# Patient Record
Sex: Female | Born: 1990 | Race: Black or African American | Hispanic: No | Marital: Single | Smoking: Current every day smoker
Health system: Southern US, Community
[De-identification: ages and names within clinical notes are randomized; demographics above are authoritative.]

## PROBLEM LIST (undated history)

## (undated) ENCOUNTER — Inpatient Hospital Stay: Payer: Self-pay

## (undated) ENCOUNTER — Ambulatory Visit: Admission: EM | Payer: Medicaid Other

## (undated) ENCOUNTER — Inpatient Hospital Stay (HOSPITAL_COMMUNITY): Payer: Self-pay

## (undated) DIAGNOSIS — Z87442 Personal history of urinary calculi: Secondary | ICD-10-CM

## (undated) DIAGNOSIS — R519 Headache, unspecified: Secondary | ICD-10-CM

## (undated) DIAGNOSIS — O139 Gestational [pregnancy-induced] hypertension without significant proteinuria, unspecified trimester: Secondary | ICD-10-CM

## (undated) DIAGNOSIS — O24419 Gestational diabetes mellitus in pregnancy, unspecified control: Secondary | ICD-10-CM

## (undated) DIAGNOSIS — N200 Calculus of kidney: Secondary | ICD-10-CM

## (undated) DIAGNOSIS — D649 Anemia, unspecified: Secondary | ICD-10-CM

## (undated) HISTORY — PX: WISDOM TOOTH EXTRACTION: SHX21

## (undated) HISTORY — PX: CHOLECYSTECTOMY: SHX55

## (undated) HISTORY — DX: Calculus of kidney: N20.0

---

## 2011-08-11 ENCOUNTER — Emergency Department (HOSPITAL_COMMUNITY)
Admission: EM | Admit: 2011-08-11 | Discharge: 2011-08-12 | Disposition: A | Discharge status: Home / Self Care | Payer: Self-pay | Attending: Emergency Medicine | Admitting: Emergency Medicine

## 2011-08-11 DIAGNOSIS — J3489 Other specified disorders of nose and nasal sinuses: Secondary | ICD-10-CM | POA: Insufficient documentation

## 2011-08-11 DIAGNOSIS — R05 Cough: Secondary | ICD-10-CM | POA: Insufficient documentation

## 2011-08-11 DIAGNOSIS — R059 Cough, unspecified: Secondary | ICD-10-CM | POA: Insufficient documentation

## 2011-08-11 DIAGNOSIS — J029 Acute pharyngitis, unspecified: Secondary | ICD-10-CM | POA: Insufficient documentation

## 2011-08-11 DIAGNOSIS — IMO0001 Reserved for inherently not codable concepts without codable children: Secondary | ICD-10-CM | POA: Insufficient documentation

## 2011-08-11 DIAGNOSIS — R6883 Chills (without fever): Secondary | ICD-10-CM | POA: Insufficient documentation

## 2011-08-11 DIAGNOSIS — J069 Acute upper respiratory infection, unspecified: Secondary | ICD-10-CM | POA: Insufficient documentation

## 2011-08-11 LAB — URINALYSIS, ROUTINE W REFLEX MICROSCOPIC
Bilirubin Urine: NEGATIVE
Hgb urine dipstick: NEGATIVE
Specific Gravity, Urine: 1.022 (ref 1.005–1.030)
pH: 7 (ref 5.0–8.0)

## 2011-08-11 LAB — URINE MICROSCOPIC-ADD ON

## 2011-08-12 ENCOUNTER — Emergency Department (HOSPITAL_COMMUNITY): Payer: Self-pay

## 2013-01-13 ENCOUNTER — Ambulatory Visit: Payer: Self-pay | Admitting: Family Medicine

## 2013-01-13 ENCOUNTER — Emergency Department: Payer: Self-pay | Admitting: Emergency Medicine

## 2013-01-13 LAB — URINALYSIS, COMPLETE
Bacteria: NONE SEEN
Ketone: NEGATIVE
Leukocyte Esterase: NEGATIVE
Ph: 6 (ref 4.5–8.0)
RBC,UR: <1 /HPF (ref 0–5)
Squamous Epithelial: 1
WBC UR: 1 /HPF (ref 0–5)

## 2013-01-13 LAB — PREGNANCY, URINE: Pregnancy Test, Urine: NEGATIVE m[IU]/mL

## 2013-04-09 ENCOUNTER — Emergency Department: Payer: Self-pay | Admitting: Emergency Medicine

## 2013-04-09 LAB — URINALYSIS, COMPLETE
Bacteria: NONE SEEN
Bilirubin,UR: NEGATIVE
Glucose,UR: NEGATIVE mg/dL (ref 0–75)
Ketone: NEGATIVE
Leukocyte Esterase: NEGATIVE
Nitrite: NEGATIVE
Ph: 8 (ref 4.5–8.0)
RBC,UR: 1 /HPF (ref 0–5)
Specific Gravity: 1.019 (ref 1.003–1.030)
Squamous Epithelial: <1
WBC UR: NONE SEEN /HPF (ref 0–5)

## 2013-04-09 LAB — CBC WITH DIFFERENTIAL/PLATELET
Basophil %: 0.7 %
Eosinophil #: 0.1 10*3/uL (ref 0.0–0.7)
Eosinophil %: 0.8 %
Monocyte #: 0.9 x10 3/mm (ref 0.2–0.9)
Neutrophil #: 12.6 10*3/uL — ABNORMAL HIGH (ref 1.4–6.5)
RBC: 4.74 10*6/uL (ref 3.80–5.20)
WBC: 16.6 10*3/uL — ABNORMAL HIGH (ref 3.6–11.0)

## 2013-04-09 LAB — COMPREHENSIVE METABOLIC PANEL
Albumin: 3.8 g/dL (ref 3.4–5.0)
Alkaline Phosphatase: 71 U/L (ref 50–136)
BUN: 16 mg/dL (ref 7–18)
EGFR (Non-African Amer.): >60
Osmolality: 281 (ref 275–301)
Total Protein: 7.7 g/dL (ref 6.4–8.2)

## 2013-04-09 LAB — HCG, QUANTITATIVE, PREGNANCY: Beta Hcg, Quant.: 30 m[IU]/mL — ABNORMAL HIGH

## 2013-06-29 ENCOUNTER — Emergency Department: Payer: Self-pay | Admitting: Emergency Medicine

## 2013-06-29 LAB — CBC
HGB: 11.4 g/dL — ABNORMAL LOW (ref 12.0–16.0)
MCHC: 36.2 g/dL — ABNORMAL HIGH (ref 32.0–36.0)
MCV: 81 fL (ref 80–100)
Platelet: 232 10*3/uL (ref 150–440)
RBC: 3.91 10*6/uL (ref 3.80–5.20)
WBC: 12.7 10*3/uL — ABNORMAL HIGH (ref 3.6–11.0)

## 2013-06-29 LAB — BASIC METABOLIC PANEL
Anion Gap: 4 — ABNORMAL LOW (ref 7–16)
Calcium, Total: 8.7 mg/dL (ref 8.5–10.1)
Chloride: 109 mmol/L — ABNORMAL HIGH (ref 98–107)
Co2: 26 mmol/L (ref 21–32)
Creatinine: 0.58 mg/dL — ABNORMAL LOW (ref 0.60–1.30)
EGFR (African American): >60
EGFR (Non-African Amer.): >60
Osmolality: 277 (ref 275–301)

## 2013-06-29 LAB — URINALYSIS, COMPLETE
Bacteria: NONE SEEN
Blood: NEGATIVE
Ketone: NEGATIVE
Ph: 6 (ref 4.5–8.0)
Protein: NEGATIVE
RBC,UR: 1 /HPF (ref 0–5)
WBC UR: 3 /HPF (ref 0–5)

## 2013-06-29 LAB — WET PREP, GENITAL

## 2013-07-29 ENCOUNTER — Emergency Department: Payer: Self-pay | Admitting: Emergency Medicine

## 2013-07-29 ENCOUNTER — Ambulatory Visit: Payer: Self-pay | Admitting: Family Medicine

## 2013-07-29 LAB — COMPREHENSIVE METABOLIC PANEL
Alkaline Phosphatase: 66 U/L (ref 50–136)
Anion Gap: 7 (ref 7–16)
BUN: 10 mg/dL (ref 7–18)
Bilirubin,Total: 0.3 mg/dL (ref 0.2–1.0)
Calcium, Total: 9 mg/dL (ref 8.5–10.1)
Co2: 25 mmol/L (ref 21–32)
EGFR (Non-African Amer.): >60
Glucose: 110 mg/dL — ABNORMAL HIGH (ref 65–99)
Osmolality: 274 (ref 275–301)
Potassium: 3.6 mmol/L (ref 3.5–5.1)
SGOT(AST): 19 U/L (ref 15–37)
SGPT (ALT): 22 U/L (ref 12–78)

## 2013-07-29 LAB — URINALYSIS, COMPLETE
Bilirubin,UR: NEGATIVE
Blood: NEGATIVE
Glucose,UR: NEGATIVE mg/dL (ref 0–75)
Nitrite: NEGATIVE
Ph: 6 (ref 4.5–8.0)
Squamous Epithelial: 25
WBC UR: 27 /HPF (ref 0–5)

## 2013-07-29 LAB — HCG, QUANTITATIVE, PREGNANCY: Beta Hcg, Quant.: 41114 m[IU]/mL — ABNORMAL HIGH

## 2013-07-29 LAB — CBC
MCH: 29.8 pg (ref 26.0–34.0)
MCHC: 36.3 g/dL — ABNORMAL HIGH (ref 32.0–36.0)
MCV: 82 fL (ref 80–100)
Platelet: 226 10*3/uL (ref 150–440)
RBC: 3.79 10*6/uL — ABNORMAL LOW (ref 3.80–5.20)
RDW: 13.6 % (ref 11.5–14.5)
WBC: 12.5 10*3/uL — ABNORMAL HIGH (ref 3.6–11.0)

## 2013-08-17 ENCOUNTER — Observation Stay: Payer: Self-pay | Admitting: Obstetrics and Gynecology

## 2013-08-17 LAB — URINALYSIS, COMPLETE
Bacteria: NONE SEEN
Blood: NEGATIVE
Ketone: NEGATIVE
Leukocyte Esterase: NEGATIVE
Specific Gravity: 1.019 (ref 1.003–1.030)
Squamous Epithelial: NONE SEEN

## 2013-12-06 ENCOUNTER — Observation Stay: Payer: Self-pay

## 2013-12-07 ENCOUNTER — Inpatient Hospital Stay: Payer: Self-pay | Admitting: Obstetrics and Gynecology

## 2013-12-07 LAB — CBC WITH DIFFERENTIAL/PLATELET
BASOS ABS: 0.1 10*3/uL (ref 0.0–0.1)
BASOS PCT: 0.4 %
EOS ABS: 0 10*3/uL (ref 0.0–0.7)
Eosinophil %: 0.2 %
HCT: 35 % (ref 35.0–47.0)
HGB: 12.1 g/dL (ref 12.0–16.0)
Lymphocyte #: 2.4 10*3/uL (ref 1.0–3.6)
Lymphocyte %: 12 %
MCH: 27.3 pg (ref 26.0–34.0)
MCHC: 34.6 g/dL (ref 32.0–36.0)
MCV: 79 fL — ABNORMAL LOW (ref 80–100)
MONO ABS: 0.9 x10 3/mm (ref 0.2–0.9)
MONOS PCT: 4.4 %
NEUTROS PCT: 83 %
Neutrophil #: 16.4 10*3/uL — ABNORMAL HIGH (ref 1.4–6.5)
PLATELETS: 240 10*3/uL (ref 150–440)
RBC: 4.43 10*6/uL (ref 3.80–5.20)
RDW: 15.2 % — AB (ref 11.5–14.5)
WBC: 19.8 10*3/uL — AB (ref 3.6–11.0)

## 2013-12-07 LAB — GC/CHLAMYDIA PROBE AMP

## 2013-12-08 LAB — HEMATOCRIT: HCT: 27.7 % — AB (ref 35.0–47.0)

## 2014-03-09 ENCOUNTER — Emergency Department: Payer: Self-pay | Admitting: Internal Medicine

## 2014-03-11 ENCOUNTER — Emergency Department: Payer: Self-pay | Admitting: Emergency Medicine

## 2014-04-08 ENCOUNTER — Emergency Department: Payer: Self-pay | Admitting: Emergency Medicine

## 2014-04-08 LAB — CBC WITH DIFFERENTIAL/PLATELET
BASOS ABS: 0.1 10*3/uL (ref 0.0–0.1)
BASOS PCT: 0.5 %
EOS PCT: 1.3 %
Eosinophil #: 0.2 10*3/uL (ref 0.0–0.7)
HCT: 36.6 % (ref 35.0–47.0)
HGB: 12.6 g/dL (ref 12.0–16.0)
LYMPHS ABS: 2.7 10*3/uL (ref 1.0–3.6)
Lymphocyte %: 18.8 %
MCH: 26.8 pg (ref 26.0–34.0)
MCHC: 34.5 g/dL (ref 32.0–36.0)
MCV: 78 fL — ABNORMAL LOW (ref 80–100)
MONO ABS: 0.6 x10 3/mm (ref 0.2–0.9)
MONOS PCT: 4.4 %
NEUTROS ABS: 10.6 10*3/uL — AB (ref 1.4–6.5)
NEUTROS PCT: 75 %
PLATELETS: 263 10*3/uL (ref 150–440)
RBC: 4.7 10*6/uL (ref 3.80–5.20)
RDW: 16 % — ABNORMAL HIGH (ref 11.5–14.5)
WBC: 14.2 10*3/uL — ABNORMAL HIGH (ref 3.6–11.0)

## 2014-04-08 LAB — URINALYSIS, COMPLETE
Bacteria: NONE SEEN
Bilirubin,UR: NEGATIVE
Blood: NEGATIVE
GLUCOSE, UR: NEGATIVE mg/dL (ref 0–75)
KETONE: NEGATIVE
NITRITE: NEGATIVE
PH: 5 (ref 4.5–8.0)
Protein: NEGATIVE
RBC, UR: NONE SEEN /HPF (ref 0–5)
SPECIFIC GRAVITY: 1.03 (ref 1.003–1.030)

## 2014-04-08 LAB — COMPREHENSIVE METABOLIC PANEL
ALBUMIN: 3.9 g/dL (ref 3.4–5.0)
ALK PHOS: 75 U/L
ALT: 48 U/L (ref 12–78)
Anion Gap: 6 — ABNORMAL LOW (ref 7–16)
BILIRUBIN TOTAL: 0.4 mg/dL (ref 0.2–1.0)
BUN: 18 mg/dL (ref 7–18)
CO2: 29 mmol/L (ref 21–32)
Calcium, Total: 9.3 mg/dL (ref 8.5–10.1)
Chloride: 106 mmol/L (ref 98–107)
Creatinine: 0.95 mg/dL (ref 0.60–1.30)
EGFR (African American): >60
EGFR (Non-African Amer.): >60
GLUCOSE: 112 mg/dL — AB (ref 65–99)
OSMOLALITY: 284 (ref 275–301)
Potassium: 3.5 mmol/L (ref 3.5–5.1)
SGOT(AST): 83 U/L — ABNORMAL HIGH (ref 15–37)
SODIUM: 141 mmol/L (ref 136–145)
TOTAL PROTEIN: 7.7 g/dL (ref 6.4–8.2)

## 2014-04-08 LAB — LIPASE, BLOOD: LIPASE: 113 U/L (ref 73–393)

## 2014-04-08 LAB — PREGNANCY, URINE: Pregnancy Test, Urine: NEGATIVE m[IU]/mL

## 2014-05-03 ENCOUNTER — Ambulatory Visit: Payer: Self-pay | Admitting: Surgery

## 2014-05-05 LAB — PATHOLOGY REPORT

## 2014-06-07 ENCOUNTER — Emergency Department: Payer: Self-pay | Admitting: Emergency Medicine

## 2014-08-23 ENCOUNTER — Emergency Department: Payer: Self-pay | Admitting: Emergency Medicine

## 2014-08-23 LAB — URINALYSIS, COMPLETE
BILIRUBIN, UR: NEGATIVE
GLUCOSE, UR: NEGATIVE mg/dL (ref 0–75)
Ketone: NEGATIVE
NITRITE: NEGATIVE
PH: 5 (ref 4.5–8.0)
RBC,UR: 191 /HPF (ref 0–5)
Specific Gravity: 1.021 (ref 1.003–1.030)
Squamous Epithelial: 3

## 2015-01-29 ENCOUNTER — Emergency Department: Payer: Self-pay | Admitting: Emergency Medicine

## 2015-03-06 LAB — HM HIV SCREENING LAB: HM HIV Screening: NEGATIVE

## 2015-03-18 NOTE — Op Note (Signed)
PATIENT NAME:  Patricia Henson, Patricia Henson MR#:  [id number] DATE OF BIRTH:  [birth year]  DATE OF PROCEDURE:  05/03/2014  PREOPERATIVE DIAGNOSIS: Biliary colic and cholelithiasis.   POSTOPERATIVE DIAGNOSIS: Biliary colic and cholelithiasis.    PROCEDURE PERFORMED: Multiport robotically-assisted laparoscopic cholecystectomy.  SURGEON: Sherri Rad, M.D.   ASSISTANT: None.   ANESTHESIA: General endotracheal.   FINDINGS: Stones.   SPECIMENS: Gallbladder with contents.   ESTIMATED BLOOD LOSS: Minimal.   DRAINS: None.   COUNTS: Lap and needle counts correct x 2.   DESCRIPTION OF PROCEDURE: Informed consent, supine position, general endotracheal anesthesia. The patient's right arm was padded and tucked at her side. Abdomen was sterilely prepped and draped utilizing ChloraPrep solution. Timeout was observed.   Hassan trocar was placed under direct visualization through an umbilical transversely oriented skin incision with stay sutures being passed through the fascia and pneumoperitoneum was established. The patient was then positioned in reverse Trendelenburg and airplane right side up. The da Vinci trocars were placed in the standard location. The patient's cart was docked. Instruments were then inserted under direct visualization. I then moved to the console.   First assistant grasper was placed on the fundus of the gallbladder elevating it towards the right shoulder and laterally. Lateral traction was achieved with Hartman's pouch. The hepatoduodenal ligament was then incised dissecting out at a cystic duct and cystic artery with a critical view of safety cholecystectomy. The cystic duct and cystic artery were occluded with hemoclips. Both the structures were then divided sharply. The gallbladder was then retrieved off the gallbladder fossa utilizing hook cautery apparatus.   The patient's cart was then undocked. The gallbladder was then extracted through the umbilical port site without any difficulty  with no Endo Catch device being used. Laparoscopic evaluation of the abdomen demonstrated there to be hemostasis on the operative field. Ports were then removed under direct visualization.   The infraumbilical fascial defect was closed with a figure-of-eight #0 Vicryl suture in vertical orientation. The existing stay sutures tied to each other. A total of 20 mL of 0.25% plain Marcaine was infiltrated along all skin incisions and fascial incisions prior to closure. 4-0 Vicryl subcuticular was applied to all skin edges followed by the application of benzoin, Steri-Strips, Telfa and Tegaderm. The patient was then subsequently extubated and taken to the recovery room in stable and satisfactory condition by anesthesia services.    ____________________________ Jeannette How Marina Gravel, MD mab:aw D: 05/04/2014 09:01:46 ET T: 05/04/2014 09:09:21 ET JOB#: [id number]  cc: Elta Guadeloupe A. Marina Gravel, MD, <Dictator> Hortencia Conradi MD ELECTRONICALLY SIGNED 05/08/2014 11:43

## 2015-03-27 ENCOUNTER — Encounter: Payer: Self-pay | Admitting: Emergency Medicine

## 2015-03-27 ENCOUNTER — Emergency Department: Payer: Medicaid Other

## 2015-03-27 ENCOUNTER — Emergency Department
Admission: EM | Admit: 2015-03-27 | Discharge: 2015-03-27 | Disposition: A | Discharge status: Home / Self Care | Payer: Medicaid Other | Attending: Emergency Medicine | Admitting: Emergency Medicine

## 2015-03-27 DIAGNOSIS — Z880 Allergy status to penicillin: Secondary | ICD-10-CM | POA: Diagnosis not present

## 2015-03-27 DIAGNOSIS — R1032 Left lower quadrant pain: Secondary | ICD-10-CM | POA: Diagnosis present

## 2015-03-27 DIAGNOSIS — N23 Unspecified renal colic: Secondary | ICD-10-CM | POA: Diagnosis not present

## 2015-03-27 DIAGNOSIS — Z3202 Encounter for pregnancy test, result negative: Secondary | ICD-10-CM | POA: Insufficient documentation

## 2015-03-27 LAB — COMPREHENSIVE METABOLIC PANEL
ALBUMIN: 4 g/dL (ref 3.5–5.0)
ALK PHOS: 62 U/L (ref 38–126)
ALT: 20 U/L (ref 14–54)
AST: 28 U/L (ref 15–41)
Anion gap: 8 (ref 5–15)
BUN: 16 mg/dL (ref 6–20)
CALCIUM: 9 mg/dL (ref 8.9–10.3)
CO2: 23 mmol/L (ref 22–32)
CREATININE: 0.82 mg/dL (ref 0.44–1.00)
Chloride: 109 mmol/L (ref 101–111)
GFR calc Af Amer: >60 mL/min (ref >60)
Glucose, Bld: 130 mg/dL — ABNORMAL HIGH (ref 65–99)
Potassium: 3.5 mmol/L (ref 3.5–5.1)
SODIUM: 140 mmol/L (ref 135–145)
Total Bilirubin: 0.5 mg/dL (ref 0.3–1.2)
Total Protein: 7.2 g/dL (ref 6.5–8.1)

## 2015-03-27 LAB — CBC WITH DIFFERENTIAL/PLATELET
BASOS ABS: 0.1 10*3/uL (ref 0–0.1)
EOS ABS: 0.2 10*3/uL (ref 0–0.7)
Eosinophils Relative: 2 %
HEMATOCRIT: 37.7 % (ref 35.0–47.0)
Hemoglobin: 12.9 g/dL (ref 12.0–16.0)
LYMPHS ABS: 3.5 10*3/uL (ref 1.0–3.6)
Lymphocytes Relative: 27 %
MCH: 26.7 pg (ref 26.0–34.0)
MCHC: 34.2 g/dL (ref 32.0–36.0)
MCV: 78.1 fL — AB (ref 80.0–100.0)
Monocytes Absolute: 0.7 10*3/uL (ref 0.2–0.9)
Neutro Abs: 8.5 10*3/uL — ABNORMAL HIGH (ref 1.4–6.5)
Platelets: 243 10*3/uL (ref 150–440)
RBC: 4.82 MIL/uL (ref 3.80–5.20)
RDW: 14.8 % — ABNORMAL HIGH (ref 11.5–14.5)
WBC: 13 10*3/uL — AB (ref 3.6–11.0)

## 2015-03-27 LAB — URINALYSIS COMPLETE WITH MICROSCOPIC (ARMC ONLY)
Bacteria, UA: NONE SEEN
Bilirubin Urine: NEGATIVE
Glucose, UA: NEGATIVE mg/dL
KETONES UR: NEGATIVE mg/dL
NITRITE: NEGATIVE
Protein, ur: 30 mg/dL — AB
SPECIFIC GRAVITY, URINE: 1.023 (ref 1.005–1.030)
pH: 5 (ref 5.0–8.0)

## 2015-03-27 LAB — LIPASE, BLOOD: LIPASE: 23 U/L (ref 22–51)

## 2015-03-27 LAB — POCT PREGNANCY, URINE: Preg Test, Ur: NEGATIVE

## 2015-03-27 MED ORDER — ONDANSETRON HCL 4 MG/2ML IJ SOLN
4.0000 mg | Freq: Once | INTRAMUSCULAR | Status: AC
  Administered 2015-03-27: 4 mg via INTRAVENOUS

## 2015-03-27 MED ORDER — ONDANSETRON HCL 4 MG/2ML IJ SOLN
INTRAMUSCULAR | Status: AC
  Administered 2015-03-27: 4 mg via INTRAVENOUS
  Filled 2015-03-27: qty 2

## 2015-03-27 MED ORDER — OXYCODONE-ACETAMINOPHEN 5-325 MG PO TABS: 1.0000 | ORAL_TABLET | Freq: Four times a day (QID) | ORAL | Status: DC | PRN

## 2015-03-27 MED ORDER — KETOROLAC TROMETHAMINE 30 MG/ML IJ SOLN
30.0000 mg | Freq: Once | INTRAMUSCULAR | Status: AC
  Administered 2015-03-27: 30 mg via INTRAVENOUS

## 2015-03-27 MED ORDER — DEXTROSE 5 % IV SOLN
INTRAVENOUS | Status: AC
  Filled 2015-03-27: qty 10

## 2015-03-27 MED ORDER — SODIUM CHLORIDE 0.9 % IV SOLN
Freq: Once | INTRAVENOUS | Status: AC
  Administered 2015-03-27: 17:00:00 via INTRAVENOUS

## 2015-03-27 MED ORDER — CEFTRIAXONE SODIUM IN DEXTROSE 20 MG/ML IV SOLN
1.0000 g | Freq: Once | INTRAVENOUS | Status: AC
  Administered 2015-03-27: 1 g via INTRAVENOUS

## 2015-03-27 MED ORDER — FENTANYL CITRATE (PF) 100 MCG/2ML IJ SOLN
50.0000 ug | Freq: Once | INTRAMUSCULAR | Status: AC
  Administered 2015-03-27: 50 ug via INTRAVENOUS

## 2015-03-27 MED ORDER — CIPROFLOXACIN HCL 250 MG PO TABS: 250.0000 mg | ORAL_TABLET | Freq: Two times a day (BID) | ORAL | Status: AC

## 2015-03-27 MED ORDER — KETOROLAC TROMETHAMINE 30 MG/ML IJ SOLN
INTRAMUSCULAR | Status: AC
  Administered 2015-03-27: 30 mg via INTRAVENOUS
  Filled 2015-03-27: qty 1

## 2015-03-27 MED ORDER — FENTANYL CITRATE (PF) 100 MCG/2ML IJ SOLN
INTRAMUSCULAR | Status: AC
  Administered 2015-03-27: 13:00:00
  Filled 2015-03-27: qty 2

## 2015-03-27 MED ORDER — TAMSULOSIN HCL 0.4 MG PO CAPS: 0.4000 mg | ORAL_CAPSULE | Freq: Every day | ORAL | Status: DC

## 2015-03-27 MED ORDER — ONDANSETRON HCL 4 MG/2ML IJ SOLN
INTRAMUSCULAR | Status: AC
  Administered 2015-03-27: 13:00:00
  Filled 2015-03-27: qty 2

## 2015-03-27 NOTE — ED Notes (Signed)
Pt sitting with in NAD in subwaiting text on phone.  When transporting patient to the room she began moaning and groaning.

## 2015-03-27 NOTE — ED Provider Notes (Signed)
[facility] Emergency Department Provider Note    ____________________________________________  Time seen: 1530  I have reviewed the triage vital signs and nursing notes.   HISTORY  Chief Complaint Abdominal Pain       HPI Patricia Henson is a 24 y.o. female who presents to ER with inability to urinate since this morning. She has lower abdominal pain and nausea and vomiting. nausea has subsided somewhat. She states she has not had this happen before she's had difficulty urinating began just today only. Describes a sharp left lower quadrant pain. This also started today. Attempt to urinate makes her symptoms worse.   No past medical history on file.  There are no active problems to display for this patient.   No past surgical history on file.  No current outpatient prescriptions on file.  Allergies Penicillins  No family history on file.  Social History History  Substance Use Topics  . Smoking status: Never Smoker   . Smokeless tobacco: Not on file  . Alcohol Use: No    Review of Systems  Constitutional: Negative for fever. Eyes: Negative for visual changes. ENT: Negative for sore throat. Cardiovascular: Negative for chest pain. Respiratory: Negative for shortness of breath. Gastrointestinal: Positive for abdominal pain and vomiting. Genitourinary: Urinary hesitancy Musculoskeletal: Negative for back pain. Skin: Negative for rash. Neurological: Negative for headaches, focal weakness or numbness.   10-point ROS otherwise negative.  ____________________________________________   PHYSICAL EXAM:  VITAL SIGNS: ED Triage Vitals  Enc Vitals Group     BP 03/27/15 1231 139/93 mmHg     Pulse Rate 03/27/15 1231 109     Resp 03/27/15 1231 20     Temp 03/27/15 1231 98.2 F (36.8 C)     Temp Source 03/27/15 1231 Oral     SpO2 03/27/15 1231 96 %     Weight 03/27/15 1229 180 lb (81.647 kg)     Height 03/27/15 1229 5\' 1"  (1.549 m)      Head Cir --      Peak Flow --      Pain Score 03/27/15 1230 9     Pain Loc --      Pain Edu? --      Excl. in [place]? --      Constitutional: Alert and oriented. Well appearing and in no distress. Eyes: Conjunctivae are normal. PERRL. Normal extraocular movements. ENT   Head: Normocephalic and atraumatic.   Nose: No congestion/rhinnorhea.   Mouth/Throat: Mucous membranes are moist.   Neck: No stridor. Hematological/Lymphatic/Immunilogical: No cervical lymphadenopathy. Cardiovascular: Normal rate, regular rhythm. Normal and symmetric distal pulses are present in all extremities. No murmurs, rubs, or gallops. Respiratory: Normal respiratory effort without tachypnea nor retractions. Breath sounds are clear and equal bilaterally. No wheezes/rales/rhonchi. Gastrointestinal: Left lower quadrant tenderness. No rebound or guarding positive bowel sounds Musculoskeletal: Nontender with normal range of motion in all extremities. No joint effusions.  No lower extremity tenderness nor edema. Neurologic:  Normal speech and language. No gross focal neurologic deficits are appreciated. Speech is normal. No gait instability. Skin:  Skin is warm, dry and intact. No rash noted. Psychiatric: Mood and affect are normal. Speech and behavior are normal. Patient exhibits appropriate insight and judgment.  ____________________________________________    LABS (pertinent positives/negatives)  Hematuria   RADIOLOGY  2 mm distal left ureteral stone  ____________________________________________   PROCEDURES  Procedure(s) performed: None  Critical Care performed: No  ____________________________________________   INITIAL IMPRESSION / ASSESSMENT AND PLAN / ED  COURSE  Pertinent labs & imaging results that were available during my care of the patient were reviewed by me and considered in my medical decision making (see chart for details).  Renal colic  Pain control IV fluids  Rocephin Toradol or reevaluate  ____________________________________________   FINAL CLINICAL IMPRESSION(S) / ED DIAGNOSES  Renal colic  Plans DC home with Flomax pain control and short course of Cipro will refer to urology for outpatient follow-up  Earleen Newport, MD 03/27/15 1727

## 2015-03-27 NOTE — ED Notes (Signed)
abd ppain and diff with urination since yesterday.

## 2015-03-27 NOTE — Discharge Instructions (Signed)
rena Kidney Stones Kidney stones (urolithiasis) are solid masses that form inside your kidneys. The intense pain is caused by the stone moving through the kidney, ureter, bladder, and urethra (urinary tract). When the stone moves, the ureter starts to spasm around the stone. The stone is usually passed in your pee (urine).  HOME CARE  Drink enough fluids to keep your pee clear or pale yellow. This helps to get the stone out.  Strain all pee through the provided strainer. Do not pee without peeing through the strainer, not even once. If you pee the stone out, catch it in the strainer. The stone may be as small as a grain of salt. Take this to your doctor. This will help your doctor figure out what you can do to try to prevent more kidney stones.  Only take medicine as told by your doctor.  Follow up with your doctor as told.  Get follow-up X-rays as told by your doctor. GET HELP IF: You have pain that gets worse even if you have been taking pain medicine. GET HELP RIGHT AWAY IF:   Your pain does not get better with medicine.  You have a fever or shaking chills.  Your pain increases and gets worse over 18 hours.  You have new belly (abdominal) pain.  You feel faint or pass out.  You are unable to pee. MAKE SURE YOU:   Understand these instructions.  Will watch your condition.  Will get help right away if you are not doing well or get worse. Document Released: 04/29/2008 Document Revised: 07/14/2013 Document Reviewed: 04/14/2013 [facility] Patient Information 2015 [place], [place]. This information is not intended to replace advice given to you by your health care provider. Make sure you discuss any questions you have with your health care provider.

## 2015-03-27 NOTE — ED Notes (Signed)
Pt complains of pain and difficulty with urination.  Pt complains of lower abdominal pain and nausea vomiting.

## 2015-04-04 NOTE — H&P (Signed)
L&D Evaluation:  History Expanded:  HPI 24 yo G1, EDD of 12/18/12 per 7 wk [place], presents at 22w 3d with c/o LLQ pain. Also reports nausea and vomiting yesterday, able to drink several cups of water this am. [facility] notable for early entry to care, Hemoglobin AC, cerix 2.6 cm at 20 wks (has repeat US scheduled at 24 wks).   Blood Type (Maternal) A positive   Group B Strep Results Maternal (Result >5wks must be treated as unknown) unknown/result > 5 weeks ago   Maternal Varicella Immune   Rubella Results (Maternal) immune   Patient's Medical History No Chronic Illness   Patient's Surgical History none   Medications Pre Natal Vitamins   Allergies NKDA   Exam:  Vital Signs stable   General no apparent distress   Mental Status clear   Chest no difficulty breathing   Abdomen gravid, non-tender   FHT +FHTs   Ucx absent   Impression:  Impression IUP at 22 wks, discomforts of pregnancy   Plan:  Plan UA   Comments Pt tolerating po well. Reports pain has resolved since arrival. I reviewed common causes of pain in pregnancy.   Electronic Signatures: Aquita Simmering, Rulon Sera (CNM)  (Signed 23-Sep-14 15:30)  Authored: L&D Evaluation   Last Updated: 23-Sep-14 15:30 by Ander Purpura (CNM)

## 2015-04-04 NOTE — H&P (Signed)
L&D Evaluation:  History Expanded:  HPI 24 yo G1, EDD of 12/18/12 per 7 wk [place], presents at [redacted]w[redacted]d with c/o strong contractions. Pt was d/c last night with c/o "cramping", cervix was 3 cm with no change after 1 hour.  PNC at [facility] notable for early entry to care, Hemoglobin AC, anemia   Blood Type (Maternal) A positive   Group B Strep Results Maternal (Result >5wks must be treated as unknown) positive   Maternal HIV Negative   Maternal Syphilis Ab Nonreactive   Maternal Varicella Immune   Rubella Results (Maternal) immune   Patient's Medical History No Chronic Illness   Patient's Surgical History wisdom teeth   Medications Pre Natal Vitamins   Allergies PCN, (hives)   Exam:  Vital Signs stable   General no apparent distress   Mental Status clear   Chest no difficulty breathing   Abdomen gravid, non-tender   Estimated Fetal Weight Large for gestational age, 8.5 lbs   Fetal Position vertex   Edema 1+   Reflexes 1+   Pelvic no external lesions, 7/80/-2   Mebranes Ruptured, SROM with exam   FHT normal rate with no decels   Ucx regular   Impression:  Impression active labor   Plan:  Plan monitor contractions and for cervical change   Comments IV Fentanyl Epidural if time allows after labs Clindamycin for GBS as PCN allergy   Electronic Signatures: Ander Purpura (CNM)  (Signed 13-Jan-15 04:00)  Authored: L&D Evaluation   Last Updated: 13-Jan-15 04:00 by Ander Purpura (CNM)

## 2015-04-04 NOTE — H&P (Signed)
L&D Evaluation:  History Expanded:  HPI 24 yo G1, EDD of 12/18/12 per 7 wk [place], presents at [redacted]w[redacted]d with c/o swelling in ankles and cramping.  Also reports nausea and recent h/o visual changes and headaches. Has not taken anything for headaches. Pt's BP elevated to 130/90 and 142/90 in office on 1/7, labs normal, PC Ratio: 135 PNC at [facility] notable for early entry to care, Hemoglobin AC, anemia   Blood Type (Maternal) A positive   Group B Strep Results Maternal (Result >5wks must be treated as unknown) positive   Maternal HIV Negative   Maternal Syphilis Ab Nonreactive   Maternal Varicella Immune   Rubella Results (Maternal) immune   Patient's Medical History No Chronic Illness   Patient's Surgical History wisdom teeth   Medications Pre Natal Vitamins   Allergies PCN   Exam:  Vital Signs stable   General no apparent distress   Mental Status clear   Chest no difficulty breathing   Abdomen gravid, non-tender   Edema 1+   Reflexes 1+   Pelvic no external lesions, 3/80/-2, BBOW   Mebranes Intact   FHT category 1 tracing, baseline 150s, + accels, no decels   Ucx irritability   Impression:  Impression IUP at 107w3d in early labor   Plan:  Plan monitor contractions and for cervical change   Electronic Signatures: Ander Purpura (CNM)  (Signed 12-Jan-15 21:27)  Authored: L&D Evaluation   Last Updated: 12-Jan-15 21:27 by Ander Purpura (CNM)

## 2015-06-22 ENCOUNTER — Encounter: Payer: Self-pay | Admitting: Intensive Care

## 2015-06-22 ENCOUNTER — Emergency Department
Admission: EM | Admit: 2015-06-22 | Discharge: 2015-06-22 | Disposition: A | Discharge status: Home / Self Care | Payer: Medicaid Other | Attending: Emergency Medicine | Admitting: Emergency Medicine

## 2015-06-22 DIAGNOSIS — Z3202 Encounter for pregnancy test, result negative: Secondary | ICD-10-CM | POA: Diagnosis not present

## 2015-06-22 DIAGNOSIS — Z880 Allergy status to penicillin: Secondary | ICD-10-CM | POA: Insufficient documentation

## 2015-06-22 DIAGNOSIS — Z79899 Other long term (current) drug therapy: Secondary | ICD-10-CM | POA: Insufficient documentation

## 2015-06-22 DIAGNOSIS — R1030 Lower abdominal pain, unspecified: Secondary | ICD-10-CM | POA: Insufficient documentation

## 2015-06-22 LAB — URINALYSIS COMPLETE WITH MICROSCOPIC (ARMC ONLY)
BILIRUBIN URINE: NEGATIVE
Bacteria, UA: NONE SEEN
Glucose, UA: NEGATIVE mg/dL
Ketones, ur: NEGATIVE mg/dL
Leukocytes, UA: NEGATIVE
NITRITE: NEGATIVE
PROTEIN: NEGATIVE mg/dL
Specific Gravity, Urine: 1.028 (ref 1.005–1.030)
pH: 5 (ref 5.0–8.0)

## 2015-06-22 LAB — COMPREHENSIVE METABOLIC PANEL
ALK PHOS: 68 U/L (ref 38–126)
ALT: 18 U/L (ref 14–54)
ANION GAP: 5 (ref 5–15)
AST: 20 U/L (ref 15–41)
Albumin: 3.8 g/dL (ref 3.5–5.0)
BUN: 20 mg/dL (ref 6–20)
CALCIUM: 9.1 mg/dL (ref 8.9–10.3)
CO2: 26 mmol/L (ref 22–32)
Chloride: 109 mmol/L (ref 101–111)
Creatinine, Ser: 0.71 mg/dL (ref 0.44–1.00)
GFR calc Af Amer: >60 mL/min (ref >60)
GLUCOSE: 111 mg/dL — AB (ref 65–99)
Potassium: 4.2 mmol/L (ref 3.5–5.1)
Sodium: 140 mmol/L (ref 135–145)
Total Bilirubin: 0.4 mg/dL (ref 0.3–1.2)
Total Protein: 7.1 g/dL (ref 6.5–8.1)

## 2015-06-22 LAB — CBC WITH DIFFERENTIAL/PLATELET
Basophils Absolute: 0.1 10*3/uL (ref 0–0.1)
Basophils Relative: 1 %
EOS PCT: 1 %
Eosinophils Absolute: 0.1 10*3/uL (ref 0–0.7)
HCT: 38.4 % (ref 35.0–47.0)
HEMOGLOBIN: 13.2 g/dL (ref 12.0–16.0)
LYMPHS ABS: 2.6 10*3/uL (ref 1.0–3.6)
LYMPHS PCT: 27 %
MCH: 27 pg (ref 26.0–34.0)
MCHC: 34.4 g/dL (ref 32.0–36.0)
MCV: 78.4 fL — AB (ref 80.0–100.0)
Monocytes Absolute: 0.8 10*3/uL (ref 0.2–0.9)
Monocytes Relative: 8 %
NEUTROS ABS: 5.9 10*3/uL (ref 1.4–6.5)
NEUTROS PCT: 63 %
Platelets: 183 10*3/uL (ref 150–440)
RBC: 4.89 MIL/uL (ref 3.80–5.20)
RDW: 14.9 % — AB (ref 11.5–14.5)
WBC: 9.4 10*3/uL (ref 3.6–11.0)

## 2015-06-22 LAB — POCT PREGNANCY, URINE: Preg Test, Ur: NEGATIVE

## 2015-06-22 MED ORDER — IBUPROFEN 600 MG PO TABS: 600.0000 mg | ORAL_TABLET | Freq: Four times a day (QID) | ORAL | Status: DC | PRN

## 2015-06-22 MED ORDER — TRAMADOL HCL 50 MG PO TABS: 50.0000 mg | ORAL_TABLET | Freq: Four times a day (QID) | ORAL | Status: DC | PRN

## 2015-06-22 NOTE — ED Notes (Signed)
Pt here suprapubic pain. Pt states that the pain started last night. Pt states that she has not had a period in 2 months and that she has not taken a pregnant test. Pt states that she started spotting today and became concerned. Pt in NAD at this time will cont to monitor pt at all times.

## 2015-06-22 NOTE — ED Provider Notes (Signed)
[facility] Emergency Department Provider Note  Time seen: 10:41 AM  I have reviewed the triage vital signs and the nursing notes.   HISTORY  Chief Complaint Abdominal Pain    HPI Patricia Henson is a 24 y.o. female with a past medical history of anxiety presents the emergency department with lower abdominal cramping, and vaginal spotting. According to the patient she had a period last month, but it was irregular and short. States for the past 2 days she's been having some lower abdominal cramping, and today awoke with some mild vaginal spotting denies clots or tissue. Patient is not sure if she is pregnant, has not taken a pregnancy test. States at times the pain tends to radiate to her back, but denies this currently. Denies any dysuria, fever, diarrhea, vaginal discharge. Patient does state occasional nausea but denies vomiting. Describes her lower abdominal pain is mild and cramping.     Past Medical History  Diagnosis Date  . Anxiety     There are no active problems to display for this patient.   Past Surgical History  Procedure Laterality Date  . Cholecystectomy      Current Outpatient Rx  Name  Route  Sig  Dispense  Refill  . oxyCODONE-acetaminophen (ROXICET) 5-325 MG per tablet   Oral   Take 1 tablet by mouth every 6 (six) hours as needed.   20 tablet   0   . tamsulosin (FLOMAX) 0.4 MG CAPS capsule   Oral   Take 1 capsule (0.4 mg total) by mouth daily.   14 capsule   0     Allergies Penicillins  History reviewed. No pertinent family history.  Social History History  Substance Use Topics  . Smoking status: Never Smoker   . Smokeless tobacco: Never Used  . Alcohol Use: No    Review of Systems Constitutional: Negative for fever. Cardiovascular: Negative for chest pain. Respiratory: Negative for shortness of breath. Gastrointestinal: Positive for lower abdominal cramping and nausea. Negative for vomiting or  diarrhea. Genitourinary: Negative for dysuria. Positive for vaginal spotting. Musculoskeletal: Negative for back pain. 10-point ROS otherwise negative.  ____________________________________________   PHYSICAL EXAM:  VITAL SIGNS: ED Triage Vitals  Enc Vitals Group     BP 06/22/15 1025 118/75 mmHg     Pulse Rate 06/22/15 1025 93     Resp 06/22/15 1025 16     Temp 06/22/15 1025 98.4 F (36.9 C)     Temp Source 06/22/15 1025 Oral     SpO2 06/22/15 1025 96 %     Weight --      Height 06/22/15 1025 5\' 2"  (1.575 m)     Head Cir --      Peak Flow --      Pain Score 06/22/15 1026 9     Pain Loc --      Pain Edu? --      Excl. in [place]? --     Constitutional: Alert and oriented. Well appearing and in no distress. Eyes: Normal exam ENT   Mouth/Throat: Mucous membranes are moist. Cardiovascular: Normal rate, regular rhythm. No murmur Respiratory: Normal respiratory effort without tachypnea nor retractions. Breath sounds are clear and equal bilaterally. No wheezes/rales/rhonchi. Gastrointestinal: Soft, mild suprapubic tenderness to palpation. No rebound or guarding. No CVA tenderness to palpation. Musculoskeletal: Nontender with normal range of motion in all extremities. Neurologic:  Normal speech and language. No gross focal neurologic deficits  Skin:  Skin is warm, dry and intact.  Psychiatric: Mood and affect are normal. Speech and behavior are normal.   ____________________________________________   INITIAL IMPRESSION / ASSESSMENT AND PLAN / ED COURSE  Pertinent labs & imaging results that were available during my care of the patient were reviewed by me and considered in my medical decision making (see chart for details).  Patient with lower abdominal pain, vaginal spotting, with recent irregular periods. We will check labs including urine pregnancy test help further evaluate. Overall patient appears very well, minimal suprapubic tenderness to palpation, otherwise benign  exam. No distress.  Labs are within normal limits. Patient states her pain is minimal at this time. We will discharge the patient home with a short course of Ultram, and follow-up with her primary care doctor. Patient is agreeable to plan.  ____________________________________________   FINAL CLINICAL IMPRESSION(S) / ED DIAGNOSES  Lower abdominal pain   Harvest Dark, MD 06/22/15 1126

## 2015-06-22 NOTE — Discharge Instructions (Signed)
Please take your pain medication as needed, as prescribed. Please follow up with your primary care doctor in the next 2 days for reevaluation. Please return to the department if you have worsening abdominal pain, fever, or any other symptom personally concerning to self.   Abdominal Pain, Women Abdominal (stomach, pelvic, or belly) pain can be caused by many things. It is important to tell your doctor:  The location of the pain.  Does it come and go or is it present all the time?  Are there things that start the pain (eating certain foods, exercise)?  Are there other symptoms associated with the pain (fever, nausea, vomiting, diarrhea)? All of this is helpful to know when trying to find the cause of the pain. CAUSES   Stomach: virus or bacteria infection, or ulcer.  Intestine: appendicitis (inflamed appendix), regional ileitis (Crohn's disease), ulcerative colitis (inflamed colon), irritable bowel syndrome, diverticulitis (inflamed diverticulum of the colon), or cancer of the stomach or intestine.  Gallbladder disease or stones in the gallbladder.  Kidney disease, kidney stones, or infection.  Pancreas infection or cancer.  Fibromyalgia (pain disorder).  Diseases of the female organs:  Uterus: fibroid (non-cancerous) tumors or infection.  Fallopian tubes: infection or tubal pregnancy.  Ovary: cysts or tumors.  Pelvic adhesions (scar tissue).  Endometriosis (uterus lining tissue growing in the pelvis and on the pelvic organs).  Pelvic congestion syndrome (female organs filling up with blood just before the menstrual period).  Pain with the menstrual period.  Pain with ovulation (producing an egg).  Pain with an IUD (intrauterine device, birth control) in the uterus.  Cancer of the female organs.  Functional pain (pain not caused by a disease, may improve without treatment).  Psychological pain.  Depression. DIAGNOSIS  Your doctor will decide the seriousness of  your pain by doing an examination.  Blood tests.  X-rays.  Ultrasound.  CT scan (computed tomography, special type of X-ray).  MRI (magnetic resonance imaging).  Cultures, for infection.  Barium enema (dye inserted in the large intestine, to better view it with X-rays).  Colonoscopy (looking in intestine with a lighted tube).  Laparoscopy (minor surgery, looking in abdomen with a lighted tube).  Major abdominal exploratory surgery (looking in abdomen with a large incision). TREATMENT  The treatment will depend on the cause of the pain.   Many cases can be observed and treated at home.  Over-the-counter medicines recommended by your caregiver.  Prescription medicine.  Antibiotics, for infection.  Birth control pills, for painful periods or for ovulation pain.  Hormone treatment, for endometriosis.  Nerve blocking injections.  Physical therapy.  Antidepressants.  Counseling with a psychologist or psychiatrist.  Minor or major surgery. HOME CARE INSTRUCTIONS   Do not take laxatives, unless directed by your caregiver.  Take over-the-counter pain medicine only if ordered by your caregiver. Do not take aspirin because it can cause an upset stomach or bleeding.  Try a clear liquid diet (broth or water) as ordered by your caregiver. Slowly move to a bland diet, as tolerated, if the pain is related to the stomach or intestine.  Have a thermometer and take your temperature several times a day, and record it.  Bed rest and sleep, if it helps the pain.  Avoid sexual intercourse, if it causes pain.  Avoid stressful situations.  Keep your follow-up appointments and tests, as your caregiver orders.  If the pain does not go away with medicine or surgery, you may try:  Acupuncture.  Relaxation exercises (yoga,  meditation).  Group therapy.  Counseling. SEEK MEDICAL CARE IF:   You notice certain foods cause stomach pain.  Your home care treatment is not  helping your pain.  You need stronger pain medicine.  You want your IUD removed.  You feel faint or lightheaded.  You develop nausea and vomiting.  You develop a rash.  You are having side effects or an allergy to your medicine. SEEK IMMEDIATE MEDICAL CARE IF:   Your pain does not go away or gets worse.  You have a fever.  Your pain is felt only in portions of the abdomen. The right side could possibly be appendicitis. The left lower portion of the abdomen could be colitis or diverticulitis.  You are passing blood in your stools (bright red or black tarry stools, with or without vomiting).  You have blood in your urine.  You develop chills, with or without a fever.  You pass out. MAKE SURE YOU:   Understand these instructions.  Will watch your condition.  Will get help right away if you are not doing well or get worse. Document Released: 09/08/2007 Document Revised: 03/28/2014 Document Reviewed: 09/28/2009 [facility] Patient Information 2015 [place], [place]. This information is not intended to replace advice given to you by your health care provider. Make sure you discuss any questions you have with your health care provider.

## 2015-06-22 NOTE — ED Notes (Signed)
Patient states she started having abdominal pain that radiates to her back. Patient reports having an irregular menstrual cycle and has started spotting. Patient says she could possibly be pregnant. Patient denies urinary symptoms and abnormal discharge. Patient denies SOB and chest pain.

## 2015-06-28 ENCOUNTER — Encounter: Payer: Self-pay | Admitting: Medical Oncology

## 2015-06-28 ENCOUNTER — Emergency Department
Admission: EM | Admit: 2015-06-28 | Discharge: 2015-06-28 | Disposition: A | Discharge status: Home / Self Care | Payer: Medicaid Other | Attending: Emergency Medicine | Admitting: Emergency Medicine

## 2015-06-28 DIAGNOSIS — S199XXA Unspecified injury of neck, initial encounter: Secondary | ICD-10-CM | POA: Diagnosis not present

## 2015-06-28 DIAGNOSIS — Z880 Allergy status to penicillin: Secondary | ICD-10-CM | POA: Diagnosis not present

## 2015-06-28 DIAGNOSIS — Y998 Other external cause status: Secondary | ICD-10-CM | POA: Insufficient documentation

## 2015-06-28 DIAGNOSIS — Y9389 Activity, other specified: Secondary | ICD-10-CM | POA: Insufficient documentation

## 2015-06-28 DIAGNOSIS — Y92410 Unspecified street and highway as the place of occurrence of the external cause: Secondary | ICD-10-CM | POA: Diagnosis not present

## 2015-06-28 DIAGNOSIS — S3992XA Unspecified injury of lower back, initial encounter: Secondary | ICD-10-CM | POA: Insufficient documentation

## 2015-06-28 DIAGNOSIS — M7918 Myalgia, other site: Secondary | ICD-10-CM

## 2015-06-28 MED ORDER — DIAZEPAM 2 MG PO TABS: 2.0000 mg | ORAL_TABLET | Freq: Three times a day (TID) | ORAL | Status: DC | PRN

## 2015-06-28 MED ORDER — DIAZEPAM 2 MG PO TABS
2.0000 mg | ORAL_TABLET | Freq: Once | ORAL | Status: AC
  Administered 2015-06-28: 2 mg via ORAL
  Filled 2015-06-28: qty 1

## 2015-06-28 MED ORDER — KETOROLAC TROMETHAMINE 60 MG/2ML IM SOLN
60.0000 mg | Freq: Once | INTRAMUSCULAR | Status: AC
  Administered 2015-06-28: 60 mg via INTRAMUSCULAR
  Filled 2015-06-28: qty 2

## 2015-06-28 MED ORDER — KETOROLAC TROMETHAMINE 10 MG PO TABS
10.0000 mg | ORAL_TABLET | Freq: Four times a day (QID) | ORAL | Status: DC | PRN
Start: 2015-06-28 — End: 2016-02-12
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
## 2015-07-29 ENCOUNTER — Emergency Department
Admission: EM | Admit: 2015-07-29 | Discharge: 2015-07-29 | Disposition: A | Discharge status: Home / Self Care | Payer: Medicaid Other | Attending: Emergency Medicine | Admitting: Emergency Medicine

## 2015-07-29 ENCOUNTER — Emergency Department: Payer: Medicaid Other

## 2015-07-29 ENCOUNTER — Encounter: Payer: Self-pay | Admitting: Emergency Medicine

## 2015-07-29 DIAGNOSIS — Z3A01 Less than 8 weeks gestation of pregnancy: Secondary | ICD-10-CM | POA: Insufficient documentation

## 2015-07-29 DIAGNOSIS — Z880 Allergy status to penicillin: Secondary | ICD-10-CM | POA: Insufficient documentation

## 2015-07-29 DIAGNOSIS — Z79899 Other long term (current) drug therapy: Secondary | ICD-10-CM | POA: Insufficient documentation

## 2015-07-29 DIAGNOSIS — R197 Diarrhea, unspecified: Secondary | ICD-10-CM | POA: Insufficient documentation

## 2015-07-29 DIAGNOSIS — O9989 Other specified diseases and conditions complicating pregnancy, childbirth and the puerperium: Secondary | ICD-10-CM | POA: Diagnosis present

## 2015-07-29 DIAGNOSIS — R109 Unspecified abdominal pain: Secondary | ICD-10-CM | POA: Diagnosis not present

## 2015-07-29 DIAGNOSIS — Z3490 Encounter for supervision of normal pregnancy, unspecified, unspecified trimester: Secondary | ICD-10-CM

## 2015-07-29 LAB — URINALYSIS COMPLETE WITH MICROSCOPIC (ARMC ONLY)
Bilirubin Urine: NEGATIVE
Glucose, UA: NEGATIVE mg/dL
Hgb urine dipstick: NEGATIVE
Nitrite: NEGATIVE
PH: 5 (ref 5.0–8.0)
Protein, ur: NEGATIVE mg/dL
Specific Gravity, Urine: 1.027 (ref 1.005–1.030)

## 2015-07-29 LAB — BASIC METABOLIC PANEL
ANION GAP: 7 (ref 5–15)
BUN: 14 mg/dL (ref 6–20)
CHLORIDE: 109 mmol/L (ref 101–111)
CO2: 23 mmol/L (ref 22–32)
Calcium: 9 mg/dL (ref 8.9–10.3)
Creatinine, Ser: 0.78 mg/dL (ref 0.44–1.00)
GFR calc Af Amer: >60 mL/min (ref >60)
Glucose, Bld: 104 mg/dL — ABNORMAL HIGH (ref 65–99)
POTASSIUM: 3.4 mmol/L — AB (ref 3.5–5.1)
SODIUM: 139 mmol/L (ref 135–145)

## 2015-07-29 LAB — POCT PREGNANCY, URINE: Preg Test, Ur: POSITIVE — AB

## 2015-07-29 LAB — CBC WITH DIFFERENTIAL/PLATELET
BASOS ABS: 0.1 10*3/uL (ref 0–0.1)
Basophils Relative: 1 %
Eosinophils Absolute: 0.2 10*3/uL (ref 0–0.7)
Eosinophils Relative: 2 %
HEMATOCRIT: 36.9 % (ref 35.0–47.0)
HEMOGLOBIN: 12.9 g/dL (ref 12.0–16.0)
LYMPHS ABS: 3.1 10*3/uL (ref 1.0–3.6)
Lymphocytes Relative: 29 %
MCH: 27.2 pg (ref 26.0–34.0)
MCHC: 34.9 g/dL (ref 32.0–36.0)
MCV: 78 fL — AB (ref 80.0–100.0)
Monocytes Absolute: 0.6 10*3/uL (ref 0.2–0.9)
Monocytes Relative: 6 %
NEUTROS ABS: 6.7 10*3/uL — AB (ref 1.4–6.5)
NEUTROS PCT: 62 %
Platelets: 286 10*3/uL (ref 150–440)
RBC: 4.74 MIL/uL (ref 3.80–5.20)
RDW: 15.2 % — ABNORMAL HIGH (ref 11.5–14.5)
WBC: 10.6 10*3/uL (ref 3.6–11.0)

## 2015-07-29 LAB — HCG, QUANTITATIVE, PREGNANCY: HCG, BETA CHAIN, QUANT, S: 5315 m[IU]/mL — AB (ref <5)

## 2015-07-29 MED ORDER — ONDANSETRON HCL 4 MG PO TABS: 4.0000 mg | ORAL_TABLET | Freq: Three times a day (TID) | ORAL | Status: DC | PRN

## 2015-07-29 MED ORDER — SODIUM CHLORIDE 0.9 % IV BOLUS (SEPSIS)
500.0000 mL | Freq: Once | INTRAVENOUS | Status: AC
  Administered 2015-07-29: 500 mL via INTRAVENOUS

## 2015-07-29 MED ORDER — KETOROLAC TROMETHAMINE 30 MG/ML IJ SOLN: 30.0000 mg | Freq: Once | INTRAMUSCULAR | Status: DC

## 2015-07-29 MED ORDER — ONDANSETRON HCL 4 MG/2ML IJ SOLN
4.0000 mg | Freq: Once | INTRAMUSCULAR | Status: AC
  Administered 2015-07-29: 4 mg via INTRAVENOUS
  Filled 2015-07-29: qty 2

## 2015-07-29 NOTE — ED Notes (Signed)
Reports lower abd pain and n/v/d x 4 days

## 2015-07-29 NOTE — ED Notes (Signed)
Discussed pregnancy status with pt, pt states she is excited, and will let her boyfriend know as soon as he comes back from breakfast.

## 2015-07-29 NOTE — ED Provider Notes (Signed)
Time Seen: Approximately.[facility]   I have reviewed the triage notes  Chief Complaint: Abdominal Pain and Diarrhea   History of Present Illness: Patricia Henson is a 24 y.o. female who presents with crampy lower middle quadrant abdominal pain over the last 4 days. She's had some frequent loose stools with nausea no persistent vomiting. She is not aware of any exacerbating or relieving factors. She denies any fever at home. She states no one else is sick in the household. She denies any lateralization of the pain or radiation to the back or flank area. She denies any dysuria, hematuria, urinary frequency. She denies any vaginal bleeding or discharge. She states her menstrual periods have been very irregular since her surgery on her gallbladder which was several months ago. She denies any headaches. She denies any chest pain or productive cough.   Past Medical History  Diagnosis Date  . Anxiety     There are no active problems to display for this patient.   Past Surgical History  Procedure Laterality Date  . Cholecystectomy      Past Surgical History  Procedure Laterality Date  . Cholecystectomy      Current Outpatient Rx  Name  Route  Sig  Dispense  Refill  . diazepam (VALIUM) 2 MG tablet   Oral   Take 1 tablet (2 mg total) by mouth every 8 (eight) hours as needed.   12 tablet   0   . ibuprofen (ADVIL,MOTRIN) 600 MG tablet   Oral   Take 1 tablet (600 mg total) by mouth every 6 (six) hours as needed.   30 tablet   0   . ketorolac (TORADOL) 10 MG tablet   Oral   Take 1 tablet (10 mg total) by mouth every 6 (six) hours as needed.   20 tablet   0   . tamsulosin (FLOMAX) 0.4 MG CAPS capsule   Oral   Take 1 capsule (0.4 mg total) by mouth daily.   14 capsule   0     Allergies:  Penicillins  Family History: History reviewed. No pertinent family history.  Social History: Social History  Substance Use Topics  . Smoking status: Never Smoker   .  Smokeless tobacco: Never Used  . Alcohol Use: No     Review of Systems:   10 point review of systems was performed and was otherwise negative:  Constitutional: No fever Eyes: No visual disturbances ENT: No sore throat, ear pain Cardiac: No chest pain Respiratory: No shortness of breath, wheezing, or stridor Abdomen: No abdominal pain, no vomiting, No diarrhea Endocrine: No weight loss, No night sweats Extremities: No peripheral edema, cyanosis Skin: No rashes, easy bruising Neurologic: No focal weakness, trouble with speech or swollowing Urologic: No dysuria, Hematuria, or urinary frequency   Physical Exam:  ED Triage Vitals  Enc Vitals Group     BP 07/29/15 0724 133/80 mmHg     Pulse Rate 07/29/15 0724 97     Resp 07/29/15 0724 16     Temp 07/29/15 0724 99 F (37.2 C)     Temp Source 07/29/15 0724 Oral     SpO2 07/29/15 0724 99 %     Weight 07/29/15 0724 208 lb (94.348 kg)     Height 07/29/15 0724 5\' 1"  (1.549 m)     Head Cir --      Peak Flow --      Pain Score 07/29/15 0720 9     Pain Loc --  Pain Edu? --      Excl. in [place]? --     General: Awake , Alert , and Oriented times 3; GCS 15 Head: Normal cephalic , atraumatic Eyes: Pupils equal , round, reactive to light Nose/Throat: No nasal drainage, patent upper airway without erythema or exudate.  Neck: Supple, Full range of motion, No anterior adenopathy or palpable thyroid masses Lungs: Clear to ascultation without wheezes , rhonchi, or rales Heart: Regular rate, regular rhythm without murmurs , gallops , or rubs Abdomen: Mildly obese Soft, non tender without rebound, guarding , or rigidity; bowel sounds positive and symmetric in all 4 quadrants. No organomegaly .   Negative tenderness over McBurney's point negative Murphy sign     Extremities: 2 plus symmetric pulses. No edema, clubbing or cyanosis Neurologic: normal ambulation, Motor symmetric without deficits, sensory intact Skin: warm, dry, no  rashes   Labs:   All laboratory work was reviewed including any pertinent negatives or positives listed below:  Labs Reviewed  BASIC METABOLIC PANEL  CBC WITH DIFFERENTIAL/PLATELET  URINALYSIS COMPLETEWITH MICROSCOPIC ([place])  POC URINE PREG, ED   urine pregnancy test was positive and patient had a quantitative pregnancy performed. Quantitative pregnancy test was 5315  Radiology:      EXAM: OBSTETRIC <14 WK [place] AND TRANSVAGINAL OB US  TECHNIQUE: Both transabdominal and transvaginal ultrasound examinations were performed for complete evaluation of the gestation as well as the maternal uterus, adnexal regions, and pelvic cul-de-sac. Transvaginal technique was performed to assess early pregnancy.  COMPARISON: None applicable  FINDINGS: Intrauterine gestational sac: Small sac-like structure identified within the endometrial canal measuring 3.7 mm  Yolk sac: None  Embryo: None  Cardiac Activity: None  MSD: 3.7 mm  5 w  1 d  Maternal uterus/adnexae: No subchorionic hemorrhage. Right ovary has a normal appearance. Suspect corpus luteum cyst in the left ovary. Small amount of free pelvic fluid is noted.  IMPRESSION: 1. Probable early intrauterine gestational sac, but no yolk sac, fetal pole, or cardiac activity yet visualized. Recommend follow-up quantitative B-HCG levels and follow-up US in 14 days to confirm and assess viability. This recommendation follows SRU consensus guidelines: Diagnostic Criteria for Nonviable Pregnancy Early in the First Trimester. [employer] Med 2013; 062:3762-83. 2. Normal appearance of the ovaries. Probable left corpus luteum cyst.   Electronically Signed By: Nolon Nations M.D. On: 07/29/2015 10:39      I personally reviewed the radiologic studies     ED Course:  Differential diagnosis includes but is not exclusive to ovarian cyst, ovarian torsion, acute appendicitis, urinary tract infection, endometriosis,  bowel obstruction, colitis, renal colic, gastroenteritis, etc.  Patient was primarily evaluated for pregnancy and related complications. She does not appear to have an ectopic pregnancy. Patient was newly diagnosed as being pregnant and has no associated urinary tract infection etc. She was given results of her laboratory testing along with her ultrasound and was advised to follow up with OB/GYN unassigned for recheck of her quantitative pregnancy patient states she is followed by [facility]. She was advised to call them for follow-up.   Assessment:  First trimester pregnancy   Final Clinical Impression: First trimester pregnancy Final diagnoses:  None     Plan:  Outpatient management patient was discharged home shift, which she was given early pregnancy instructions. She was also prescribed Zofran for morning sickness. She was advised to return here if she has significant bleeding, increased pain, fever, or any other new concerns.  Go to top  Daymon Larsen, MD 07/29/15 1124

## 2015-07-29 NOTE — Discharge Instructions (Signed)
First Trimester of Pregnancy The first trimester of pregnancy is from week 1 until the end of week 12 (months 1 through 3). During this time, your baby will begin to develop inside you. At 6-8 weeks, the eyes and face are formed, and the heartbeat can be seen on ultrasound. At the end of 12 weeks, all the baby's organs are formed. Prenatal care is all the medical care you receive before the birth of your baby. Make sure you get good prenatal care and follow all of your doctor's instructions. HOME CARE  Medicines  Take medicine only as told by your doctor. Some medicines are safe and some are not during pregnancy.  Take your prenatal vitamins as told by your doctor.  Take medicine that helps you poop (stool softener) as needed if your doctor says it is okay. Diet  Eat regular, healthy meals.  Your doctor will tell you the amount of weight gain that is right for you.  Avoid raw meat and uncooked cheese.  If you feel sick to your stomach (nauseous) or throw up (vomit):  Eat 4 or 5 small meals a day instead of 3 large meals.  Try eating a few soda crackers.  Drink liquids between meals instead of during meals.  If you have a hard time pooping (constipation):  Eat high-fiber foods like fresh vegetables, fruit, and whole grains.  Drink enough fluids to keep your pee (urine) clear or pale yellow. Activity and Exercise  Exercise only as told by your doctor. Stop exercising if you have cramps or pain in your lower belly (abdomen) or low back.  Try to avoid standing for long periods of time. Move your legs often if you must stand in one place for a long time.  Avoid heavy lifting.  Wear low-heeled shoes. Sit and stand up straight.  You can have sex unless your doctor tells you not to. Relief of Pain or Discomfort  Wear a good support bra if your breasts are sore.  Take warm water baths (sitz baths) to soothe pain or discomfort caused by hemorrhoids. Use hemorrhoid cream if your  doctor says it is okay.  Rest with your legs raised if you have leg cramps or low back pain.  Wear support hose if you have puffy, bulging veins (varicose veins) in your legs. Raise (elevate) your feet for 15 minutes, 3-4 times a day. Limit salt in your diet. Prenatal Care  Schedule your prenatal visits by the twelfth week of pregnancy.  Write down your questions. Take them to your prenatal visits.  Keep all your prenatal visits as told by your doctor. Safety  Wear your seat belt at all times when driving.  Make a list of emergency phone numbers. The list should include numbers for family, friends, the hospital, and police and fire departments. General Tips  Ask your doctor for a referral to a local prenatal class. Begin classes no later than at the start of month 6 of your pregnancy.  Ask for help if you need counseling or help with nutrition. Your doctor can give you advice or tell you where to go for help.  Do not use hot tubs, steam rooms, or saunas.  Do not douche or use tampons or scented sanitary pads.  Do not cross your legs for long periods of time.  Avoid litter boxes and soil used by cats.  Avoid all smoking, herbs, and alcohol. Avoid drugs not approved by your doctor.  Visit your dentist. At home, brush your teeth  with a soft toothbrush. Be gentle when you floss. GET HELP IF:  You are dizzy.  You have mild cramps or pressure in your lower belly.  You have a nagging pain in your belly area.  You continue to feel sick to your stomach, throw up, or have watery poop (diarrhea).  You have a bad smelling fluid coming from your vagina.  You have pain with peeing (urination).  You have increased puffiness (swelling) in your face, hands, legs, or ankles. GET HELP RIGHT AWAY IF:   You have a fever.  You are leaking fluid from your vagina.  You have spotting or bleeding from your vagina.  You have very bad belly cramping or pain.  You gain or lose weight  rapidly.  You throw up blood. It may look like coffee grounds.  You are around people who have [place] measles, fifth disease, or chickenpox.  You have a very bad headache.  You have shortness of breath.  You have any kind of trauma, such as from a fall or a car accident. Document Released: 04/29/2008 Document Revised: 03/28/2014 Document Reviewed: 09/21/2013 [facility] Patient Information 2015 [place], [place]. This information is not intended to replace advice given to you by your health care provider. Make sure you discuss any questions you have with your health care provider.  Please return immediately if condition worsens. Please contact her primary physician or the physician you were given for referral. If you have any specialist physicians involved in her treatment and plan please also contact them. Thank you for using [facility]. Tylenol for pain

## 2015-08-18 ENCOUNTER — Emergency Department
Admission: EM | Admit: 2015-08-18 | Discharge: 2015-08-18 | Disposition: A | Discharge status: Home / Self Care | Payer: Medicaid Other | Attending: Emergency Medicine | Admitting: Emergency Medicine

## 2015-08-18 ENCOUNTER — Emergency Department: Payer: Medicaid Other

## 2015-08-18 ENCOUNTER — Encounter: Payer: Self-pay | Admitting: Emergency Medicine

## 2015-08-18 DIAGNOSIS — Z880 Allergy status to penicillin: Secondary | ICD-10-CM | POA: Insufficient documentation

## 2015-08-18 DIAGNOSIS — O209 Hemorrhage in early pregnancy, unspecified: Secondary | ICD-10-CM | POA: Diagnosis present

## 2015-08-18 DIAGNOSIS — Z79899 Other long term (current) drug therapy: Secondary | ICD-10-CM | POA: Diagnosis not present

## 2015-08-18 DIAGNOSIS — Z3A08 8 weeks gestation of pregnancy: Secondary | ICD-10-CM | POA: Insufficient documentation

## 2015-08-18 DIAGNOSIS — O200 Threatened abortion: Secondary | ICD-10-CM | POA: Diagnosis not present

## 2015-08-18 DIAGNOSIS — O210 Mild hyperemesis gravidarum: Secondary | ICD-10-CM | POA: Insufficient documentation

## 2015-08-18 DIAGNOSIS — O219 Vomiting of pregnancy, unspecified: Secondary | ICD-10-CM

## 2015-08-18 LAB — CBC WITH DIFFERENTIAL/PLATELET
BASOS ABS: 0.1 10*3/uL (ref 0–0.1)
BASOS PCT: 0 %
Eosinophils Absolute: 0.1 10*3/uL (ref 0–0.7)
Eosinophils Relative: 1 %
HEMATOCRIT: 35.3 % (ref 35.0–47.0)
HEMOGLOBIN: 12.3 g/dL (ref 12.0–16.0)
LYMPHS PCT: 20 %
Lymphs Abs: 2.7 10*3/uL (ref 1.0–3.6)
MCH: 27.6 pg (ref 26.0–34.0)
MCHC: 34.9 g/dL (ref 32.0–36.0)
MCV: 79.1 fL — AB (ref 80.0–100.0)
Monocytes Absolute: 0.6 10*3/uL (ref 0.2–0.9)
Monocytes Relative: 5 %
NEUTROS ABS: 10 10*3/uL — AB (ref 1.4–6.5)
NEUTROS PCT: 74 %
Platelets: 245 10*3/uL (ref 150–440)
RBC: 4.46 MIL/uL (ref 3.80–5.20)
RDW: 15.2 % — ABNORMAL HIGH (ref 11.5–14.5)
WBC: 13.5 10*3/uL — ABNORMAL HIGH (ref 3.6–11.0)

## 2015-08-18 LAB — COMPREHENSIVE METABOLIC PANEL
ALK PHOS: 54 U/L (ref 38–126)
ALT: 43 U/L (ref 14–54)
ANION GAP: 7 (ref 5–15)
AST: 33 U/L (ref 15–41)
Albumin: 4 g/dL (ref 3.5–5.0)
BUN: 15 mg/dL (ref 6–20)
CALCIUM: 9.1 mg/dL (ref 8.9–10.3)
CO2: 23 mmol/L (ref 22–32)
Chloride: 104 mmol/L (ref 101–111)
Creatinine, Ser: 0.67 mg/dL (ref 0.44–1.00)
GFR calc non Af Amer: >60 mL/min (ref >60)
Glucose, Bld: 96 mg/dL (ref 65–99)
POTASSIUM: 3.5 mmol/L (ref 3.5–5.1)
SODIUM: 134 mmol/L — AB (ref 135–145)
TOTAL PROTEIN: 7.1 g/dL (ref 6.5–8.1)
Total Bilirubin: 0.3 mg/dL (ref 0.3–1.2)

## 2015-08-18 LAB — HCG, QUANTITATIVE, PREGNANCY: HCG, BETA CHAIN, QUANT, S: 124269 m[IU]/mL — AB (ref <5)

## 2015-08-18 LAB — ABO/RH: ABO/RH(D): A POS

## 2015-08-18 MED ORDER — METOCLOPRAMIDE HCL 5 MG/ML IJ SOLN
10.0000 mg | Freq: Once | INTRAMUSCULAR | Status: AC
  Administered 2015-08-18: 10 mg via INTRAVENOUS
  Filled 2015-08-18: qty 2

## 2015-08-18 MED ORDER — SODIUM CHLORIDE 0.9 % IV BOLUS (SEPSIS)
1000.0000 mL | Freq: Once | INTRAVENOUS | Status: AC
  Administered 2015-08-18: 1000 mL via INTRAVENOUS

## 2015-08-18 MED ORDER — ACETAMINOPHEN 500 MG PO TABS
1000.0000 mg | ORAL_TABLET | Freq: Once | ORAL | Status: AC
  Administered 2015-08-18: 1000 mg via ORAL

## 2015-08-18 MED ORDER — ACETAMINOPHEN 500 MG PO TABS
ORAL_TABLET | ORAL | Status: AC
  Administered 2015-08-18: 1000 mg via ORAL
  Filled 2015-08-18: qty 2

## 2015-08-18 NOTE — ED Notes (Signed)
Reports bright red vaginal bleeding onset today, spotting.  Reports about [redacted] weeks pregnant, states LMP was sometime in july

## 2015-08-18 NOTE — ED Notes (Signed)
Right 20g IV removed by Apolonio Schneiders, RN with Velta Addison, RN present as witness.
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
## 2015-08-18 NOTE — ED Notes (Signed)
Lab called to add hcg and ABO/Rh to previous blood work

## 2015-08-18 NOTE — ED Provider Notes (Addendum)
[facility] Emergency Department Provider Note  Time seen: 1:25 PM  I have reviewed the triage vital signs and the nursing notes.   HISTORY  Chief Complaint Vaginal Bleeding    HPI Patricia Henson is a 24 y.o. female with a past medical history of anxiety, currently approximately [redacted] weeks pregnant presents the emergency department with lower abdominal cramping for the past 6-7 weeks, now with vaginal bleeding starting this morning which she describes as mild/spotting. Patient also states during her pregnancy she has been very nauseated with frequent vomiting. Patient has been on Zofran without relief. Patient states she feels very dehydrated. Denies any increase in her lower abdominal pain. She has had lower abdominal pain throughout her entire pregnancy. Describes her vaginal spotting as mild, she states is actually gone currently.     Past Medical History  Diagnosis Date  . Anxiety     There are no active problems to display for this patient.   Past Surgical History  Procedure Laterality Date  . Cholecystectomy      Current Outpatient Rx  Name  Route  Sig  Dispense  Refill  . diazepam (VALIUM) 2 MG tablet   Oral   Take 1 tablet (2 mg total) by mouth every 8 (eight) hours as needed.   12 tablet   0   . ibuprofen (ADVIL,MOTRIN) 600 MG tablet   Oral   Take 1 tablet (600 mg total) by mouth every 6 (six) hours as needed.   30 tablet   0   . ketorolac (TORADOL) 10 MG tablet   Oral   Take 1 tablet (10 mg total) by mouth every 6 (six) hours as needed.   20 tablet   0   . ondansetron (ZOFRAN) 4 MG tablet   Oral   Take 1 tablet (4 mg total) by mouth every 8 (eight) hours as needed for nausea or vomiting.   21 tablet   0   . tamsulosin (FLOMAX) 0.4 MG CAPS capsule   Oral   Take 1 capsule (0.4 mg total) by mouth daily.   14 capsule   0     Allergies Penicillins  History reviewed. No pertinent family history.  Social History Social  History  Substance Use Topics  . Smoking status: Never Smoker   . Smokeless tobacco: Never Used  . Alcohol Use: No    Review of Systems Constitutional: Negative for fever. Cardiovascular: Negative for chest pain. Respiratory: Negative for shortness of breath. Gastrointestinal: Lower abdominal pain 6 weeks. Genitourinary: Negative for dysuria. Mild vaginal bleeding spotting, now resolved. Musculoskeletal: Negative for back pain. Neurological: Negative for headache 10-point ROS otherwise negative.  ____________________________________________   PHYSICAL EXAM:  VITAL SIGNS: ED Triage Vitals  Enc Vitals Group     BP 08/18/15 1208 129/75 mmHg     Pulse Rate 08/18/15 1208 90     Resp 08/18/15 1208 18     Temp 08/18/15 1208 98.6 F (37 C)     Temp src --      SpO2 08/18/15 1208 99 %     Weight 08/18/15 1208 205 lb (92.987 kg)     Height 08/18/15 1208 5\' 2"  (1.575 m)     Head Cir --      Peak Flow --      Pain Score 08/18/15 1209 7     Pain Loc --      Pain Edu? --      Excl. in [place]? --  Constitutional: Alert and oriented. Well appearing and in no distress. Eyes: Normal exam ENT   Mouth/Throat: Mucous membranes are moist. Cardiovascular: Normal rate, regular rhythm. No murmurs, rubs, or gallops. Respiratory: Normal respiratory effort without tachypnea nor retractions. Breath sounds are clear and equal bilaterally. No wheezes/rales/rhonchi. Gastrointestinal: Soft, mild suprapubic tenderness palpation. No rebound or guarding. No distention. No CVA tenderness. Musculoskeletal: Nontender with normal range of motion in all extremities. Neurologic:  Normal speech and language. No gross focal neurologic deficits are appreciated. Speech is normal. Skin:  Skin is warm, dry and intact.  Psychiatric: Mood and affect are normal. Speech and behavior are normal. Patient exhibits appropriate insight and judgment.  ____________________________________________      RADIOLOGY  Ultrasound shows single live IUP at 7 weeks 4 days.  ____________________________________________    INITIAL IMPRESSION / ASSESSMENT AND PLAN / ED COURSE  Pertinent labs & imaging results that were available during my care of the patient were reviewed by me and considered in my medical decision making (see chart for details).  Patient approximately [redacted] weeks pregnant by LMP, vaginal spotting this morning along with lower abdominal pain for the past several weeks. We will obtain labs, ultrasound, and treat her nausea with Reglan and IV fluids. Patient is agreeable to plan. Currently overall the patient appears very well.  Labs are largely within normal limits. Ultrasound shows single live IUP at 7 weeks 4 days. We'll discharge patient home with OB follow-up.   ____________________________________________   FINAL CLINICAL IMPRESSION(S) / ED DIAGNOSES  Threatened abortion Nausea   Harvest Dark, MD 08/18/15 1555  Harvest Dark, MD 08/18/15 [phone]

## 2015-08-18 NOTE — Discharge Instructions (Signed)
Morning Sickness Morning sickness is when you feel sick to your stomach (nauseous) during pregnancy. You may feel sick to your stomach and throw up (vomit). You may feel sick in the morning, but you can feel this way any time of day. Some women feel very sick to their stomach and cannot stop throwing up (hyperemesis gravidarum). HOME CARE  Only take medicines as told by your doctor.  Take multivitamins as told by your doctor. Taking multivitamins before getting pregnant can stop or lessen the harshness of morning sickness.  Eat dry toast or unsalted crackers before getting out of bed.  Eat 5 to 6 small meals a day.  Eat dry and bland foods like rice and baked potatoes.  Do not drink liquids with meals. Drink between meals.  Do not eat greasy, fatty, or spicy foods.  Have someone cook for you if the smell of food causes you to feel sick or throw up.  If you feel sick to your stomach after taking prenatal vitamins, take them at night or with a snack.  Eat protein when you need a snack (nuts, yogurt, cheese).  Eat unsweetened gelatins for dessert.  Wear a bracelet used for sea sickness (acupressure wristband).  Go to a doctor that puts thin needles into certain body points (acupuncture) to improve how you feel.  Do not smoke.  Use a humidifier to keep the air in your house free of odors.  Get lots of fresh air. GET HELP IF:  You need medicine to feel better.  You feel dizzy or lightheaded.  You are losing weight. GET HELP RIGHT AWAY IF:   You feel very sick to your stomach and cannot stop throwing up.  You pass out (faint). MAKE SURE YOU:  Understand these instructions.  Will watch your condition.  Will get help right away if you are not doing well or get worse. Document Released: 12/19/2004 Document Revised: 11/16/2013 Document Reviewed: 04/28/2013 [facility] Patient Information 2015 [place], [place]. This information is not intended to replace advice given to you by  your health care provider. Make sure you discuss any questions you have with your health care provider.  Threatened Miscarriage A threatened miscarriage is when you have vaginal bleeding during your first 20 weeks of pregnancy but the pregnancy has not ended. Your doctor will do tests to make sure you are still pregnant. The cause of the bleeding may not be known. This condition does not mean your pregnancy will end. It does increase the risk of it ending (complete miscarriage). HOME CARE   Make sure you keep all your doctor visits for prenatal care.  Get plenty of rest.  Do not have sex or use tampons if you have vaginal bleeding.  Do not douche.  Do not smoke or use drugs.  Do not drink alcohol.  Avoid caffeine. GET HELP IF:  You have light bleeding from your vagina.  You have belly pain or cramping.  You have a fever. GET HELP RIGHT AWAY IF:   You have heavy bleeding from your vagina.  You have clots of blood coming from your vagina.  You have bad pain or cramps in your low back or belly.  You have fever, chills, and bad belly pain. MAKE SURE YOU:   Understand these instructions.  Will watch your condition.  Will get help right away if you are not doing well or get worse. Document Released: 10/24/2008 Document Revised: 11/16/2013 Document Reviewed: 09/07/2013 [facility] Patient Information 2015 [place], [place]. This information is  not intended to replace advice given to you by your health care provider. Make sure you discuss any questions you have with your health care provider.

## 2015-08-18 NOTE — ED Notes (Addendum)
Pt reports having some cramping with a small amount of spotting blood.  Had recent miscarriage in the past few months. Also reports nausea and vomiting but has feelings of being hungry.

## 2015-08-18 NOTE — ED Notes (Signed)
Patient called out and asked if her IV could be removed.  I explained that IV's usually stay in place until discharge as the MD may place new orders and IV access will be needed.  Boyfriend at bedside demanded the nurse take out IV.

## 2015-11-09 ENCOUNTER — Encounter: Payer: Self-pay | Admitting: Emergency Medicine

## 2015-11-09 ENCOUNTER — Emergency Department: Payer: Medicaid Other

## 2015-11-09 ENCOUNTER — Emergency Department
Admission: EM | Admit: 2015-11-09 | Discharge: 2015-11-09 | Disposition: A | Discharge status: Home / Self Care | Payer: Medicaid Other | Attending: Emergency Medicine | Admitting: Emergency Medicine

## 2015-11-09 DIAGNOSIS — B3731 Acute candidiasis of vulva and vagina: Secondary | ICD-10-CM

## 2015-11-09 DIAGNOSIS — B373 Candidiasis of vulva and vagina: Secondary | ICD-10-CM | POA: Insufficient documentation

## 2015-11-09 DIAGNOSIS — Z880 Allergy status to penicillin: Secondary | ICD-10-CM | POA: Diagnosis not present

## 2015-11-09 DIAGNOSIS — Z79899 Other long term (current) drug therapy: Secondary | ICD-10-CM | POA: Diagnosis not present

## 2015-11-09 DIAGNOSIS — O9A212 Injury, poisoning and certain other consequences of external causes complicating pregnancy, second trimester: Secondary | ICD-10-CM | POA: Diagnosis not present

## 2015-11-09 DIAGNOSIS — O98812 Other maternal infectious and parasitic diseases complicating pregnancy, second trimester: Secondary | ICD-10-CM | POA: Diagnosis not present

## 2015-11-09 DIAGNOSIS — Z3A19 19 weeks gestation of pregnancy: Secondary | ICD-10-CM | POA: Diagnosis not present

## 2015-11-09 DIAGNOSIS — O9989 Other specified diseases and conditions complicating pregnancy, childbirth and the puerperium: Secondary | ICD-10-CM | POA: Diagnosis present

## 2015-11-09 DIAGNOSIS — T378X5A Adverse effect of other specified systemic anti-infectives and antiparasitics, initial encounter: Secondary | ICD-10-CM | POA: Insufficient documentation

## 2015-11-09 DIAGNOSIS — T50905A Adverse effect of unspecified drugs, medicaments and biological substances, initial encounter: Secondary | ICD-10-CM

## 2015-11-09 DIAGNOSIS — R197 Diarrhea, unspecified: Secondary | ICD-10-CM | POA: Diagnosis not present

## 2015-11-09 LAB — URINALYSIS COMPLETE WITH MICROSCOPIC (ARMC ONLY)
BILIRUBIN URINE: NEGATIVE
Bacteria, UA: NONE SEEN
GLUCOSE, UA: NEGATIVE mg/dL
Hgb urine dipstick: NEGATIVE
KETONES UR: NEGATIVE mg/dL
Nitrite: NEGATIVE
PH: 6 (ref 5.0–8.0)
Protein, ur: NEGATIVE mg/dL
Specific Gravity, Urine: 1.02 (ref 1.005–1.030)

## 2015-11-09 LAB — WET PREP, GENITAL
Trich, Wet Prep: NEGATIVE — AB
YEAST WET PREP: POSITIVE — AB

## 2015-11-09 LAB — CHLAMYDIA/NGC RT PCR (ARMC ONLY)
CHLAMYDIA TR: NOT DETECTED
N GONORRHOEAE: NOT DETECTED

## 2015-11-09 MED ORDER — MICONAZOLE NITRATE 2 % VA CREA: 1.0000 | TOPICAL_CREAM | Freq: Every day | VAGINAL | Status: AC

## 2015-11-09 NOTE — ED Notes (Signed)
MD at bedside. 

## 2015-11-09 NOTE — Discharge Instructions (Signed)

## 2015-11-09 NOTE — ED Notes (Signed)
Pt arrived to the ED for complaints of lower abdominal pain, pelvic and vaginal swelling for the last 3 days. Pt reports that she is taking Flagyl for "abacterial infection and ever since she has been experiencing the discomfort. Pt is [redacted] weeks pregnant. Pt is AOx4 in no apparent distress.

## 2015-11-09 NOTE — ED Provider Notes (Signed)
[facility] Emergency Department Provider Note  ____________________________________________  Time seen: Approximately 2:25 AM  I have reviewed the triage vital signs and the nursing notes.   HISTORY  Chief Complaint Abdominal Pain and Pelvic Pain    HPI Patricia Henson is a 24 y.o. female with a history of anxiety and a cholecystectomy who is presenting in [redacted] weeks pregnant with lower abdominal cramping. She says that she was seen 2 days ago at her OB/GYN's office where she was diagnosed with bacterial vaginosis. She was then started on Flagyl and since she has had diarrhea as well as lower abdominal cramping. She says the cramping at this time is a 7 out of 10 and nonradiating. She says that she is also had nausea but she says that has been ongoing since her pregnancy began. She says she also has feels sensitivity as well as swelling to the vagina diffusely. She says she is not on prenatal vitamins at this time because she says she needs to pick up the chew vitamins over-the-counter.she says that since the antibiotics were started the discharge has since subsided.patient denies any vaginal bleeding.   Past Medical History  Diagnosis Date  . Anxiety     There are no active problems to display for this patient.   Past Surgical History  Procedure Laterality Date  . Cholecystectomy      Current Outpatient Rx  Name  Route  Sig  Dispense  Refill  . diazepam (VALIUM) 2 MG tablet   Oral   Take 1 tablet (2 mg total) by mouth every 8 (eight) hours as needed.   12 tablet   0   . ibuprofen (ADVIL,MOTRIN) 600 MG tablet   Oral   Take 1 tablet (600 mg total) by mouth every 6 (six) hours as needed.   30 tablet   0   . ketorolac (TORADOL) 10 MG tablet   Oral   Take 1 tablet (10 mg total) by mouth every 6 (six) hours as needed.   20 tablet   0   . ondansetron (ZOFRAN) 4 MG tablet   Oral   Take 1 tablet (4 mg total) by mouth every 8 (eight) hours as  needed for nausea or vomiting.   21 tablet   0   . tamsulosin (FLOMAX) 0.4 MG CAPS capsule   Oral   Take 1 capsule (0.4 mg total) by mouth daily.   14 capsule   0     Allergies Penicillins  History reviewed. No pertinent family history.  Social History Social History  Substance Use Topics  . Smoking status: Never Smoker   . Smokeless tobacco: Never Used  . Alcohol Use: No    Review of Systems Constitutional: No fever/chills Eyes: No visual changes. ENT: No sore throat. Cardiovascular: Denies chest pain. Respiratory: Denies shortness of breath. Gastrointestinal: No constipation. Genitourinary: Negative for dysuria. Musculoskeletal: Negative for back pain. Skin: Negative for rash. Neurological: Negative for headaches, focal weakness or numbness.  10-point ROS otherwise negative.  ____________________________________________   PHYSICAL EXAM:  VITAL SIGNS: ED Triage Vitals  Enc Vitals Group     BP 11/09/15 0047 120/64 mmHg     Pulse Rate 11/09/15 0047 99     Resp 11/09/15 0047 16     Temp 11/09/15 0047 98.6 F (37 C)     Temp Source 11/09/15 0047 Oral     SpO2 11/09/15 0047 97 %     Weight 11/09/15 0047 207 lb (93.895 kg)  Height 11/09/15 0047 5\' 2"  (1.575 m)     Head Cir --      Peak Flow --      Pain Score 11/09/15 0047 9     Pain Loc --      Pain Edu? --      Excl. in [place]? --     Constitutional: Alert and oriented. Well appearing and in no acute distress. Eyes: Conjunctivae are normal. PERRL. EOMI. Head: Atraumatic. Nose: No congestion/rhinnorhea. Mouth/Throat: Mucous membranes are moist.  Neck: No stridor.   Cardiovascular: Normal rate, regular rhythm. Grossly normal heart sounds.  Good peripheral circulation. Respiratory: Normal respiratory effort.  No retractions. Lungs CTAB. Gastrointestinal: Soft with mild tenderness across the lower abdomen. There is no rebound or guarding. No distention. No abdominal bruits.  Genitourinary: normal  external appearance. Speculum exam with thick cottage cheese like discharge. Bimanual exam with a closed cervical os. There is no CMT. There is only mild uterine tenderness as well as bilateral adnexal tenderness without any masses palpated. Musculoskeletal: No lower extremity tenderness nor edema.  No joint effusions. Neurologic:  Normal speech and language. No gross focal neurologic deficits are appreciated. No gait instability. Skin:  Skin is warm, dry and intact. No rash noted. Psychiatric: Mood and affect are normal. Speech and behavior are normal.  ____________________________________________   LABS (all labs ordered are listed, but only abnormal results are displayed)  Labs Reviewed  WET PREP, GENITAL - Abnormal; Notable for the following:    Yeast Wet Prep HPF POC POSITIVE (*)    Trich, Wet Prep NEGATIVE (*)    Clue Cells Wet Prep HPF POC NONE (*)    WBC, Wet Prep HPF POC MANY (*)    All other components within normal limits  URINALYSIS COMPLETEWITH MICROSCOPIC ([facility] ONLY) - Abnormal; Notable for the following:    Color, Urine YELLOW (*)    APPearance CLEAR (*)    Leukocytes, UA TRACE (*)    Squamous Epithelial / LPF 0-5 (*)    All other components within normal limits  CHLAMYDIA/NGC RT PCR ([facility] ONLY)   ____________________________________________  EKG   ____________________________________________  RADIOLOGY Single live IUP at about 19 weeks 0 days without any emergent complications.  ____________________________________________   PROCEDURES   ____________________________________________   INITIAL IMPRESSION / ASSESSMENT AND PLAN / ED COURSE  Pertinent labs & imaging results that were available during my care of the patient were reviewed by me and considered in my medical decision making (see chart for details).  ----------------------------------------- 3:47 AM on 11/09/2015 ----------------------------------------- Patient is resting  comfortably at this time. I updated her as well as her acquaintance regarding her lab results. She has no signs of bacterial vaginosis on today's swab. However, her wet prep does confirm that she has a yeast infection. She will be given a prescription for miconazole topical for 7 days which is safe in pregnancy. She will discontinue the metronidazole she says the discharge has now stopped and she appears to be having side effects from the Flagyl. She has a follow-up appointment tomorrow at her OB/GYN at [facility]. She says that she will be able to pick up she was in a vitamins when she makes a prescription for the yeast infection treatment. Generous and the plan is going to comply. Will be discharged home.  ____________________________________________   FINAL CLINICAL IMPRESSION(S) / ED DIAGNOSES  Medication side effects. Candida vaginitis.    Orbie Pyo, MD 11/09/15 [phone]

## 2015-11-09 NOTE — ED Notes (Signed)
Patient with no complaints at this time. Respirations even and unlabored. Skin warm/dry. Discharge instructions reviewed with patient at this time. Patient given opportunity to voice concerns/ask questions. Patient discharged at this time and left Emergency Department with steady gait.   

## 2015-11-26 ENCOUNTER — Emergency Department
Admission: EM | Admit: 2015-11-26 | Discharge: 2015-11-26 | Disposition: A | Discharge status: Home / Self Care | Payer: Medicaid Other | Attending: Emergency Medicine | Admitting: Emergency Medicine

## 2015-11-26 ENCOUNTER — Emergency Department: Payer: Medicaid Other

## 2015-11-26 ENCOUNTER — Encounter: Payer: Self-pay | Admitting: Emergency Medicine

## 2015-11-26 DIAGNOSIS — Z880 Allergy status to penicillin: Secondary | ICD-10-CM | POA: Insufficient documentation

## 2015-11-26 DIAGNOSIS — Z79899 Other long term (current) drug therapy: Secondary | ICD-10-CM | POA: Insufficient documentation

## 2015-11-26 DIAGNOSIS — Y9389 Activity, other specified: Secondary | ICD-10-CM | POA: Diagnosis not present

## 2015-11-26 DIAGNOSIS — Z3492 Encounter for supervision of normal pregnancy, unspecified, second trimester: Secondary | ICD-10-CM

## 2015-11-26 DIAGNOSIS — R112 Nausea with vomiting, unspecified: Secondary | ICD-10-CM

## 2015-11-26 DIAGNOSIS — Y998 Other external cause status: Secondary | ICD-10-CM | POA: Diagnosis not present

## 2015-11-26 DIAGNOSIS — S39012A Strain of muscle, fascia and tendon of lower back, initial encounter: Secondary | ICD-10-CM | POA: Insufficient documentation

## 2015-11-26 DIAGNOSIS — O212 Late vomiting of pregnancy: Secondary | ICD-10-CM | POA: Diagnosis not present

## 2015-11-26 DIAGNOSIS — O9A212 Injury, poisoning and certain other consequences of external causes complicating pregnancy, second trimester: Secondary | ICD-10-CM | POA: Insufficient documentation

## 2015-11-26 DIAGNOSIS — Z3A21 21 weeks gestation of pregnancy: Secondary | ICD-10-CM | POA: Insufficient documentation

## 2015-11-26 DIAGNOSIS — Y92410 Unspecified street and highway as the place of occurrence of the external cause: Secondary | ICD-10-CM | POA: Insufficient documentation

## 2015-11-26 LAB — URINALYSIS COMPLETE WITH MICROSCOPIC (ARMC ONLY)
Bilirubin Urine: NEGATIVE
Glucose, UA: NEGATIVE mg/dL
Nitrite: NEGATIVE
PH: 6 (ref 5.0–8.0)
PROTEIN: 30 mg/dL — AB
Specific Gravity, Urine: 1.024 (ref 1.005–1.030)

## 2015-11-26 LAB — CBC WITH DIFFERENTIAL/PLATELET
BASOS ABS: 0.1 10*3/uL (ref 0–0.1)
Basophils Relative: 1 %
EOS PCT: 1 %
Eosinophils Absolute: 0.1 10*3/uL (ref 0–0.7)
HCT: 33.4 % — ABNORMAL LOW (ref 35.0–47.0)
Hemoglobin: 11.7 g/dL — ABNORMAL LOW (ref 12.0–16.0)
LYMPHS PCT: 13 %
Lymphs Abs: 1.5 10*3/uL (ref 1.0–3.6)
MCH: 28.9 pg (ref 26.0–34.0)
MCHC: 35 g/dL (ref 32.0–36.0)
MCV: 82.5 fL (ref 80.0–100.0)
Monocytes Absolute: 0.6 10*3/uL (ref 0.2–0.9)
Monocytes Relative: 5 %
NEUTROS ABS: 9.6 10*3/uL — AB (ref 1.4–6.5)
Neutrophils Relative %: 80 %
PLATELETS: 248 10*3/uL (ref 150–440)
RBC: 4.05 MIL/uL (ref 3.80–5.20)
RDW: 13.6 % (ref 11.5–14.5)
WBC: 11.9 10*3/uL — AB (ref 3.6–11.0)

## 2015-11-26 LAB — COMPREHENSIVE METABOLIC PANEL
ALBUMIN: 3.7 g/dL (ref 3.5–5.0)
ALT: 13 U/L — AB (ref 14–54)
AST: 14 U/L — AB (ref 15–41)
Alkaline Phosphatase: 53 U/L (ref 38–126)
Anion gap: 6 (ref 5–15)
BUN: 12 mg/dL (ref 6–20)
CHLORIDE: 106 mmol/L (ref 101–111)
CO2: 25 mmol/L (ref 22–32)
CREATININE: 0.55 mg/dL (ref 0.44–1.00)
Calcium: 8.9 mg/dL (ref 8.9–10.3)
GFR calc Af Amer: >60 mL/min (ref >60)
GLUCOSE: 93 mg/dL (ref 65–99)
Potassium: 3.5 mmol/L (ref 3.5–5.1)
Sodium: 137 mmol/L (ref 135–145)
Total Bilirubin: 0.8 mg/dL (ref 0.3–1.2)
Total Protein: 7.3 g/dL (ref 6.5–8.1)

## 2015-11-26 MED ORDER — ONDANSETRON HCL 4 MG/2ML IJ SOLN
4.0000 mg | Freq: Once | INTRAMUSCULAR | Status: AC
  Administered 2015-11-26: 4 mg via INTRAVENOUS
  Filled 2015-11-26: qty 2

## 2015-11-26 MED ORDER — ONDANSETRON HCL 4 MG PO TABS: 4.0000 mg | ORAL_TABLET | Freq: Three times a day (TID) | ORAL | Status: DC | PRN

## 2015-11-26 MED ORDER — SODIUM CHLORIDE 0.9 % IV BOLUS (SEPSIS)
500.0000 mL | Freq: Once | INTRAVENOUS | Status: AC
  Administered 2015-11-26: 500 mL via INTRAVENOUS

## 2015-11-26 MED ORDER — ACETAMINOPHEN 500 MG PO TABS
1000.0000 mg | ORAL_TABLET | Freq: Once | ORAL | Status: AC
  Administered 2015-11-26: 1000 mg via ORAL
  Filled 2015-11-26: qty 2

## 2015-11-26 NOTE — Discharge Instructions (Signed)
Nausea and Vomiting Nausea is a sick feeling that often comes before throwing up (vomiting). Vomiting is a reflex where stomach contents come out of your mouth. Vomiting can cause severe loss of body fluids (dehydration). Children and elderly adults can become dehydrated quickly, especially if they also have diarrhea. Nausea and vomiting are symptoms of a condition or disease. It is important to find the cause of your symptoms. CAUSES   Direct irritation of the stomach lining. This irritation can result from increased acid production (gastroesophageal reflux disease), infection, food poisoning, taking certain medicines (such as nonsteroidal anti-inflammatory drugs), alcohol use, or tobacco use.  Signals from the brain.These signals could be caused by a headache, heat exposure, an inner ear disturbance, increased pressure in the brain from injury, infection, a tumor, or a concussion, pain, emotional stimulus, or metabolic problems.  An obstruction in the gastrointestinal tract (bowel obstruction).  Illnesses such as diabetes, hepatitis, gallbladder problems, appendicitis, kidney problems, cancer, sepsis, atypical symptoms of a heart attack, or eating disorders.  Medical treatments such as chemotherapy and radiation.  Receiving medicine that makes you sleep (general anesthetic) during surgery. DIAGNOSIS Your caregiver may ask for tests to be done if the problems do not improve after a few days. Tests may also be done if symptoms are severe or if the reason for the nausea and vomiting is not clear. Tests may include:  Urine tests.  Blood tests.  Stool tests.  Cultures (to look for evidence of infection).  X-rays or other imaging studies. Test results can help your caregiver make decisions about treatment or the need for additional tests. TREATMENT You need to stay well hydrated. Drink frequently but in small amounts.You may wish to drink water, sports drinks, clear broth, or eat frozen  ice pops or gelatin dessert to help stay hydrated.When you eat, eating slowly may help prevent nausea.There are also some antinausea medicines that may help prevent nausea. HOME CARE INSTRUCTIONS   Take all medicine as directed by your caregiver.  If you do not have an appetite, do not force yourself to eat. However, you must continue to drink fluids.  If you have an appetite, eat a normal diet unless your caregiver tells you differently.  Eat a variety of complex carbohydrates (rice, wheat, potatoes, bread), lean meats, yogurt, fruits, and vegetables.  Avoid high-fat foods because they are more difficult to digest.  Drink enough water and fluids to keep your urine clear or pale yellow.  If you are dehydrated, ask your caregiver for specific rehydration instructions. Signs of dehydration may include:  Severe thirst.  Dry lips and mouth.  Dizziness.  Dark urine.  Decreasing urine frequency and amount.  Confusion.  Rapid breathing or pulse. SEEK IMMEDIATE MEDICAL CARE IF:   You have blood or brown flecks (like coffee grounds) in your vomit.  You have black or bloody stools.  You have a severe headache or stiff neck.  You are confused.  You have severe abdominal pain.  You have chest pain or trouble breathing.  You do not urinate at least once every 8 hours.  You develop cold or clammy skin.  You continue to vomit for longer than 24 to 48 hours.  You have a fever. MAKE SURE YOU:   Understand these instructions.  Will watch your condition.  Will get help right away if you are not doing well or get worse.   This information is not intended to replace advice given to you by your health care provider. Make sure  you discuss any questions you have with your health care provider.   Document Released: 11/11/2005 Document Revised: 02/03/2012 Document Reviewed: 04/10/2011 Elsevier Interactive Patient Education 2016 [place] of  Pregnancy The second trimester is from week 13 through week 28, months 4 through 6. The second trimester is often a time when you feel your best. Your body has also adjusted to being pregnant, and you begin to feel better physically. Usually, morning sickness has lessened or quit completely, you may have more energy, and you may have an increase in appetite. The second trimester is also a time when the fetus is growing rapidly. At the end of the sixth month, the fetus is about 9 inches long and weighs about 1 pounds. You will likely begin to feel the baby move (quickening) between 18 and 20 weeks of the pregnancy. BODY CHANGES Your body goes through many changes during pregnancy. The changes vary from woman to woman.   Your weight will continue to increase. You will notice your lower abdomen bulging out.  You may begin to get stretch marks on your hips, abdomen, and breasts.  You may develop headaches that can be relieved by medicines approved by your health care provider.  You may urinate more often because the fetus is pressing on your bladder.  You may develop or continue to have heartburn as a result of your pregnancy.  You may develop constipation because certain hormones are causing the muscles that push waste through your intestines to slow down.  You may develop hemorrhoids or swollen, bulging veins (varicose veins).  You may have back pain because of the weight gain and pregnancy hormones relaxing your joints between the bones in your pelvis and as a result of a shift in weight and the muscles that support your balance.  Your breasts will continue to grow and be tender.  Your gums may bleed and may be sensitive to brushing and flossing.  Dark spots or blotches (chloasma, mask of pregnancy) may develop on your face. This will likely fade after the baby is born.  A dark line from your belly button to the pubic area (linea nigra) may appear. This will likely fade after the baby is  born.  You may have changes in your hair. These can include thickening of your hair, rapid growth, and changes in texture. Some women also have hair loss during or after pregnancy, or hair that feels dry or thin. Your hair will most likely return to normal after your baby is born. WHAT TO EXPECT AT YOUR PRENATAL VISITS During a routine prenatal visit:  You will be weighed to make sure you and the fetus are growing normally.  Your blood pressure will be taken.  Your abdomen will be measured to track your baby's growth.  The fetal heartbeat will be listened to.  Any test results from the previous visit will be discussed. Your health care provider may ask you:  How you are feeling.  If you are feeling the baby move.  If you have had any abnormal symptoms, such as leaking fluid, bleeding, severe headaches, or abdominal cramping.  If you are using any tobacco products, including cigarettes, chewing tobacco, and electronic cigarettes.  If you have any questions. Other tests that may be performed during your second trimester include:  Blood tests that check for:  Low iron levels (anemia).  Gestational diabetes (between 24 and 28 weeks).  Rh antibodies.  Urine tests to check for infections, diabetes, or protein  in the urine.  An ultrasound to confirm the proper growth and development of the baby.  An amniocentesis to check for possible genetic problems.  Fetal screens for spina bifida and Down syndrome.  HIV (human immunodeficiency virus) testing. Routine prenatal testing includes screening for HIV, unless you choose not to have this test. HOME CARE INSTRUCTIONS   Avoid all smoking, herbs, alcohol, and unprescribed drugs. These chemicals affect the formation and growth of the baby.  Do not use any tobacco products, including cigarettes, chewing tobacco, and electronic cigarettes. If you need help quitting, ask your health care provider. You may receive counseling support and  other resources to help you quit.  Follow your health care provider's instructions regarding medicine use. There are medicines that are either safe or unsafe to take during pregnancy.  Exercise only as directed by your health care provider. Experiencing uterine cramps is a good sign to stop exercising.  Continue to eat regular, healthy meals.  Wear a good support bra for breast tenderness.  Do not use hot tubs, steam rooms, or saunas.  Wear your seat belt at all times when driving.  Avoid raw meat, uncooked cheese, cat litter boxes, and soil used by cats. These carry germs that can cause birth defects in the baby.  Take your prenatal vitamins.  Take 1500-2000 mg of calcium daily starting at the 20th week of pregnancy until you deliver your baby.  Try taking a stool softener (if your health care provider approves) if you develop constipation. Eat more high-fiber foods, such as fresh vegetables or fruit and whole grains. Drink plenty of fluids to keep your urine clear or pale yellow.  Take warm sitz baths to soothe any pain or discomfort caused by hemorrhoids. Use hemorrhoid cream if your health care provider approves.  If you develop varicose veins, wear support hose. Elevate your feet for 15 minutes, 3-4 times a day. Limit salt in your diet.  Avoid heavy lifting, wear low heel shoes, and practice good posture.  Rest with your legs elevated if you have leg cramps or low back pain.  Visit your dentist if you have not gone yet during your pregnancy. Use a soft toothbrush to brush your teeth and be gentle when you floss.  A sexual relationship may be continued unless your health care provider directs you otherwise.  Continue to go to all your prenatal visits as directed by your health care provider. SEEK MEDICAL CARE IF:   You have dizziness.  You have mild pelvic cramps, pelvic pressure, or nagging pain in the abdominal area.  You have persistent nausea, vomiting, or  diarrhea.  You have a bad smelling vaginal discharge.  You have pain with urination. SEEK IMMEDIATE MEDICAL CARE IF:   You have a fever.  You are leaking fluid from your vagina.  You have spotting or bleeding from your vagina.  You have severe abdominal cramping or pain.  You have rapid weight gain or loss.  You have shortness of breath with chest pain.  You notice sudden or extreme swelling of your face, hands, ankles, feet, or legs.  You have not felt your baby move in over an hour.  You have severe headaches that do not go away with medicine.  You have vision changes.   This information is not intended to replace advice given to you by your health care provider. Make sure you discuss any questions you have with your health care provider.   Document Released: 11/05/2001 Document Revised: 12/02/2014 Document  Reviewed: 01/12/2013 Elsevier Interactive Patient Education 2016 [place].  Please contact your OB/GYN in follow-up concerning urinary tract infection. Urine culture is pending

## 2015-11-26 NOTE — ED Notes (Signed)
Pt reports has not had an appetite for three days. States she is just getting over a UTI. Pt is [redacted] weeks pregnant. Denies vaginal bleeding. Pt states she has been having some abdominal cramps since yesterday.
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
## 2015-11-26 NOTE — L&D Delivery Note (Signed)
Deliver Note   Date of Delivery:   03/26/2016 Primary OB:   [facility] Gestational Age/EDD: [redacted]w[redacted]d by 04/01/2016, by Ultrasound  Antepartum complications:  OB History    Gravida Para Term Preterm AB TAB SAB Ectopic Multiple Living   2 2 2       0 0      Delivered By:   Malachy Mood MD  Delivery Type:   Forceps Anesthesia:    Epidural  Intrapartum complications:  GBS:    Positive Laceration:    1st degreed Episiotomy:    none Placenta:    Spontaneous Estimated Blood Loss:  228mL Baby:    Liveborn female , APGAR (1 MIN): 7   APGAR (5 MINS): 9, weight 3160g   Deliver Details   A  female was delivered via FAVD (Presentation: Straight OP   ).  APGAR: 7, 9; weight 3160g.   Placenta status: spontaneous, intact.  Cord:  3VC.  Patient began displaying recurrent late deceleration, position checked felt to be ROA, 2+ station.  Long Eliot forceps applied.  Despite GDM diagnosis recent growth scan revealed a SGA fetus with small AC.  Blades articulated, checked and noted to be appropriately positioned based on midline suture.  Fetal vertex delivered with second contraction, blades disarticulate inbetween contractions.  The blades were disarticulated from the fetal head after delivery of the head.  Remainder of the body delivered without difficulty.  Cord clamped and cut and infant passed to awaiting pediatricians. Placenta delivered intact, small 1st degree repaired with 3-0 monocryl in a running fashion    Mom to postpartum.  Baby to Couplet care / Skin to Skin.

## 2015-11-27 LAB — URINE CULTURE

## 2016-02-12 ENCOUNTER — Observation Stay
Admission: EM | Admit: 2016-02-12 | Discharge: 2016-02-12 | Disposition: A | Discharge status: Home / Self Care | Payer: Medicaid Other | Attending: Certified Nurse Midwife | Admitting: Certified Nurse Midwife

## 2016-02-12 DIAGNOSIS — O99013 Anemia complicating pregnancy, third trimester: Secondary | ICD-10-CM | POA: Diagnosis not present

## 2016-02-12 DIAGNOSIS — Z880 Allergy status to penicillin: Secondary | ICD-10-CM | POA: Insufficient documentation

## 2016-02-12 DIAGNOSIS — E669 Obesity, unspecified: Secondary | ICD-10-CM | POA: Diagnosis not present

## 2016-02-12 DIAGNOSIS — O98813 Other maternal infectious and parasitic diseases complicating pregnancy, third trimester: Secondary | ICD-10-CM | POA: Diagnosis not present

## 2016-02-12 DIAGNOSIS — Z3A33 33 weeks gestation of pregnancy: Secondary | ICD-10-CM | POA: Insufficient documentation

## 2016-02-12 DIAGNOSIS — D573 Sickle-cell trait: Secondary | ICD-10-CM | POA: Diagnosis not present

## 2016-02-12 DIAGNOSIS — O99213 Obesity complicating pregnancy, third trimester: Secondary | ICD-10-CM | POA: Insufficient documentation

## 2016-02-12 DIAGNOSIS — O26853 Spotting complicating pregnancy, third trimester: Principal | ICD-10-CM | POA: Insufficient documentation

## 2016-02-12 DIAGNOSIS — B373 Candidiasis of vulva and vagina: Secondary | ICD-10-CM | POA: Insufficient documentation

## 2016-02-12 DIAGNOSIS — O200 Threatened abortion: Secondary | ICD-10-CM | POA: Diagnosis present

## 2016-02-12 LAB — URINALYSIS COMPLETE WITH MICROSCOPIC (ARMC ONLY)
BILIRUBIN URINE: NEGATIVE
Bacteria, UA: NONE SEEN
GLUCOSE, UA: NEGATIVE mg/dL
HGB URINE DIPSTICK: NEGATIVE
LEUKOCYTES UA: NEGATIVE
Nitrite: NEGATIVE
Protein, ur: 30 mg/dL — AB
Specific Gravity, Urine: 1.031 — ABNORMAL HIGH (ref 1.005–1.030)
pH: 5 (ref 5.0–8.0)

## 2016-02-12 NOTE — Final Progress Note (Addendum)
Physician Final Progress Note  Patient ID: Patricia Henson MRN: [id number] DOB/AGE: 1991-06-04 25 y.o.  Admit date: 02/12/2016 Admitting provider: Aletha Halim, MD Discharge date: 02/12/2016   Admission Diagnoses: Spotting and cramping in third trimester  Discharge Diagnoses:  Monilial vaginitis Cramping in third trimester  Consults: none  Significant Findings/ Diagnostic Studies: 25 year old G2 P1001 with EDC=04/01/2016 presents at 33 weeks with c/o noticing spotting this AM when wiping and constant lower abdominal cramping today while at work. Has had 1 glass of water today and 2 glasses of soda. No dysuria or frequency. Has noticed more vaginal discharge with no vulvar itching since being treated for bacterial vaginosis 2 weeks ago. Baby active. Will occasionally have a sharp shooting pain into her vagina. Last IC was 1 month ago. Hx of SVD x1 [facility] at [facility] also remarkable for obesity, sickle cell trait, MJ use in pregnancy, UTI,  and an elevated one hour glucola.  Exam: Temp(Src) 99 F (37.2 C)  LMP 05/28/2015 (LMP Unknown)131/77 General: no acute distress FHR: 135 with accels to 160s, moderate variability Toco: no contractions seen Speculum exam: Vulva: no lesions or inflammation Vagina: white to yellow mucoid discharge, no bloood seen Wet prep: positive for hyphae, no Trich or clue cells Cervix: L/firm/closed/OOP Results for orders placed or performed during the hospital encounter of 02/12/16 (from the past 24 hour(s))  Urinalysis complete, with microscopic ([facility] only)     Status: Abnormal   Collection Time: 02/12/16  6:06 PM  Result Value Ref Range   Color, Urine YELLOW (A) YELLOW   APPearance CLEAR (A) CLEAR   Glucose, UA NEGATIVE NEGATIVE mg/dL   Bilirubin Urine NEGATIVE NEGATIVE   Ketones, ur TRACE (A) NEGATIVE mg/dL   Specific Gravity, Urine 1.031 (H) 1.005 - 1.030   Hgb urine dipstick NEGATIVE NEGATIVE   pH 5.0 5.0 - 8.0   Protein, ur  30 (A) NEGATIVE mg/dL   Nitrite NEGATIVE NEGATIVE   Leukocytes, UA NEGATIVE NEGATIVE   RBC / HPF 0-5 0 - 5 RBC/hpf   WBC, UA 0-5 0 - 5 WBC/hpf   Bacteria, UA NONE SEEN NONE SEEN   Squamous Epithelial / LPF 0-5 (A) NONE SEEN   Mucous PRESENT    A: IUP at 33 weeks, no evidence of labor Monilial vaginitis may have caused spotting  High specific gravity of urine-not drinking enough water Reactive NST/ Cat1 tracing  P: DC home Terazol 3 suppositories Increase water intake to 48-60oz/day Needs to reschedule ROB appt  Procedures: none  Discharge Condition: good  Disposition: 01-Home or Self Care  Diet: Regular diet  Discharge Activity: Activity as tolerated     Medication List    Notice    You have not been prescribed any medications.       Total time spent taking care of this patient: 15 minutes  Signed: Dalia Heading 02/12/2016, 10:38 PM

## 2016-02-12 NOTE — OB Triage Note (Signed)
Patient discharged home ambulatory and in stable condition after verbalizing understanding of all written instructions.  AVS provided to patient.

## 2016-02-12 NOTE — Plan of Care (Signed)
Urinalysis sent to lab. Will give pt a snack  And wait for urine results

## 2016-02-12 NOTE — Plan of Care (Signed)
Pt presents to l/d with c/o cramping and spotting that started this am. Pt works at [employer]. Pt has a 24 yr old that she says she was planning to bring in when she goes into labor. Discussed need to have child care when pt goes into labor

## 2016-03-08 ENCOUNTER — Observation Stay
Admission: EM | Admit: 2016-03-08 | Discharge: 2016-03-09 | Disposition: A | Discharge status: Home / Self Care | Payer: Medicaid Other | Attending: Obstetrics and Gynecology | Admitting: Obstetrics and Gynecology

## 2016-03-08 ENCOUNTER — Encounter: Payer: Self-pay | Admitting: *Deleted

## 2016-03-08 DIAGNOSIS — O9989 Other specified diseases and conditions complicating pregnancy, childbirth and the puerperium: Secondary | ICD-10-CM | POA: Diagnosis not present

## 2016-03-08 DIAGNOSIS — O99323 Drug use complicating pregnancy, third trimester: Secondary | ICD-10-CM | POA: Diagnosis not present

## 2016-03-08 DIAGNOSIS — Z3A36 36 weeks gestation of pregnancy: Secondary | ICD-10-CM | POA: Insufficient documentation

## 2016-03-08 DIAGNOSIS — D573 Sickle-cell trait: Secondary | ICD-10-CM | POA: Diagnosis not present

## 2016-03-08 DIAGNOSIS — O99013 Anemia complicating pregnancy, third trimester: Secondary | ICD-10-CM | POA: Insufficient documentation

## 2016-03-08 DIAGNOSIS — Z6838 Body mass index (BMI) 38.0-38.9, adult: Secondary | ICD-10-CM | POA: Insufficient documentation

## 2016-03-08 DIAGNOSIS — Z880 Allergy status to penicillin: Secondary | ICD-10-CM | POA: Insufficient documentation

## 2016-03-08 DIAGNOSIS — F1290 Cannabis use, unspecified, uncomplicated: Secondary | ICD-10-CM | POA: Insufficient documentation

## 2016-03-08 DIAGNOSIS — O99213 Obesity complicating pregnancy, third trimester: Secondary | ICD-10-CM | POA: Diagnosis not present

## 2016-03-08 DIAGNOSIS — R51 Headache: Secondary | ICD-10-CM | POA: Insufficient documentation

## 2016-03-08 DIAGNOSIS — E669 Obesity, unspecified: Secondary | ICD-10-CM | POA: Diagnosis not present

## 2016-03-08 HISTORY — DX: Gestational diabetes mellitus in pregnancy, unspecified control: O24.419

## 2016-03-08 LAB — CBC
HCT: 29.3 % — ABNORMAL LOW (ref 35.0–47.0)
Hemoglobin: 10.1 g/dL — ABNORMAL LOW (ref 12.0–16.0)
MCH: 27.2 pg (ref 26.0–34.0)
MCHC: 34.5 g/dL (ref 32.0–36.0)
MCV: 78.8 fL — ABNORMAL LOW (ref 80.0–100.0)
PLATELETS: 194 10*3/uL (ref 150–440)
RBC: 3.72 MIL/uL — AB (ref 3.80–5.20)
RDW: 14.2 % (ref 11.5–14.5)
WBC: 15.5 10*3/uL — AB (ref 3.6–11.0)

## 2016-03-08 LAB — COMPREHENSIVE METABOLIC PANEL
ALBUMIN: 3 g/dL — AB (ref 3.5–5.0)
ALT: 10 U/L — AB (ref 14–54)
ANION GAP: 8 (ref 5–15)
AST: 18 U/L (ref 15–41)
Alkaline Phosphatase: 74 U/L (ref 38–126)
BUN: 7 mg/dL (ref 6–20)
CHLORIDE: 107 mmol/L (ref 101–111)
CO2: 19 mmol/L — AB (ref 22–32)
Calcium: 8.5 mg/dL — ABNORMAL LOW (ref 8.9–10.3)
Creatinine, Ser: 0.52 mg/dL (ref 0.44–1.00)
GFR calc non Af Amer: >60 mL/min (ref >60)
GLUCOSE: 109 mg/dL — AB (ref 65–99)
Potassium: 3.5 mmol/L (ref 3.5–5.1)
SODIUM: 134 mmol/L — AB (ref 135–145)
Total Bilirubin: 0.5 mg/dL (ref 0.3–1.2)
Total Protein: 6.3 g/dL — ABNORMAL LOW (ref 6.5–8.1)

## 2016-03-08 LAB — URINE DRUG SCREEN, QUALITATIVE (ARMC ONLY)
Amphetamines, Ur Screen: NOT DETECTED
BARBITURATES, UR SCREEN: NOT DETECTED
Benzodiazepine, Ur Scrn: NOT DETECTED
CANNABINOID 50 NG, UR ~~LOC~~: NOT DETECTED
Cocaine Metabolite,Ur ~~LOC~~: NOT DETECTED
MDMA (ECSTASY) UR SCREEN: NOT DETECTED
METHADONE SCREEN, URINE: NOT DETECTED
Opiate, Ur Screen: NOT DETECTED
Phencyclidine (PCP) Ur S: NOT DETECTED
TRICYCLIC, UR SCREEN: NOT DETECTED

## 2016-03-08 LAB — TYPE AND SCREEN
ABO/RH(D): A POS
ANTIBODY SCREEN: NEGATIVE

## 2016-03-08 LAB — PROTEIN / CREATININE RATIO, URINE
Creatinine, Urine: 126 mg/dL
PROTEIN CREATININE RATIO: 0.09 mg/mg{creat} (ref 0.00–0.15)
TOTAL PROTEIN, URINE: 11 mg/dL

## 2016-03-08 MED ORDER — SODIUM CHLORIDE 0.9 % IV BOLUS (SEPSIS)
1000.0000 mL | Freq: Once | INTRAVENOUS | Status: AC
  Administered 2016-03-08: 1000 mL via INTRAVENOUS

## 2016-03-08 NOTE — OB Triage Note (Signed)
Contractions since 0300 this am. Denies any leaking of fluid. C/O lower abdominal pain since 0300 this am as well. Patricia Henson

## 2016-03-09 DIAGNOSIS — O9989 Other specified diseases and conditions complicating pregnancy, childbirth and the puerperium: Secondary | ICD-10-CM | POA: Diagnosis not present

## 2016-03-09 MED ORDER — PROCHLORPERAZINE MALEATE 10 MG PO TABS
10.0000 mg | ORAL_TABLET | Freq: Once | ORAL | Status: AC
  Administered 2016-03-09: 10 mg via ORAL
  Filled 2016-03-09: qty 1

## 2016-03-09 MED ORDER — PROMETHAZINE HCL 12.5 MG PO TABS
6.2500 mg | ORAL_TABLET | Freq: Once | ORAL | Status: AC
  Administered 2016-03-09: 6.25 mg via ORAL
  Filled 2016-03-09: qty 1

## 2016-03-09 MED ORDER — OXYCODONE-ACETAMINOPHEN 5-325 MG PO TABS
1.0000 | ORAL_TABLET | Freq: Four times a day (QID) | ORAL | Status: DC | PRN
  Administered 2016-03-09: 1 via ORAL

## 2016-03-09 MED ORDER — SODIUM CHLORIDE 0.9 % IV SOLN
INTRAVENOUS | Status: DC
  Administered 2016-03-08 – 2016-03-09 (×2): via INTRAVENOUS

## 2016-03-09 MED ORDER — MAGNESIUM SULFATE 50 % IJ SOLN
3.0000 g | Freq: Once | INTRAVENOUS | Status: AC
  Administered 2016-03-09: 3 g via INTRAVENOUS
  Filled 2016-03-09: qty 6

## 2016-03-09 MED ORDER — DIPHENHYDRAMINE HCL 25 MG PO CAPS
25.0000 mg | ORAL_CAPSULE | Freq: Once | ORAL | Status: AC
  Administered 2016-03-09: 25 mg via ORAL
  Filled 2016-03-09: qty 1

## 2016-03-09 MED ORDER — OXYCODONE-ACETAMINOPHEN 5-325 MG PO TABS
ORAL_TABLET | ORAL | Status: AC
  Administered 2016-03-09: 1 via ORAL
  Filled 2016-03-09: qty 1

## 2016-03-09 MED ORDER — ACETAMINOPHEN 500 MG PO TABS
1000.0000 mg | ORAL_TABLET | Freq: Once | ORAL | Status: AC
  Administered 2016-03-09: 1000 mg via ORAL
  Filled 2016-03-09: qty 2

## 2016-03-09 NOTE — Discharge Instructions (Signed)
°  This information is not intended to replace advice given to you by your health care provider. Make sure you discuss any questions you have with your health care provider.  Take phenergan and Magnesium as instructed for your headache if Tylenol does not work.   Follow up with your regular appointment on 4/18.   If your symptoms persist or worsen, call your provider or [place] [facility] Delivery   [phone]

## 2016-03-09 NOTE — Discharge Summary (Signed)
S: Pt feeling much better after Magnesium and Phenergan. Reports headache resolved. Resting in bed. No further pregnancy complaints (ctx, LOF, VB or decreased FM)   O: VSS, no new labs to report  A: IUP at [redacted]w[redacted]d with headache (resolved), no evidence of pre-eclampsia  P: D/c home with Rx for Phenergan and Magnesium supplement QHS F/u at office on Monday for BP check and NST

## 2016-03-09 NOTE — Consult Note (Signed)
CC: headache  HPI: Patricia Henson is an 25 y.o. female [place] . G6P1001 female at [redacted]w[redacted]d. Her pregnancy has been complicated by obesity, sickle cell trait, MJ use, UTI. Pt is complaining of bifrontal pressure like headache with slight radiation to right. No neck stiffness. Positive photophobia. No visual impairment.    Past Medical History  Diagnosis Date  . Anxiety   . Gestational diabetes     Past Surgical History  Procedure Laterality Date  . Cholecystectomy      History reviewed. No pertinent family history.  Social History:  reports that she has never smoked. She has never used smokeless tobacco. She reports that she uses illicit drugs (Marijuana). She reports that she does not drink alcohol.  Allergies  Allergen Reactions  . Penicillins Rash    Has patient had a PCN reaction causing immediate rash, facial/tongue/throat swelling, SOB or lightheadedness with hypotension: Yes Has patient had a PCN reaction causing severe rash involving mucus membranes or skin necrosis: No Has patient had a PCN reaction that required hospitalization No patient not sure Has patient had a PCN reaction occurring within the last 10 years: No If all of the above answers are "NO", then may proceed with Cephalosporin use.    Medications: I have reviewed the patient's current medications.  ROS: History obtained from the patient  General ROS: negative for - chills, fatigue, fever, night sweats, weight gain or weight loss Psychological ROS: negative for - behavioral disorder, hallucinations, memory difficulties, mood swings or suicidal ideation Ophthalmic ROS: negative for - blurry vision, double vision, eye pain or loss of vision ENT ROS: negative for - epistaxis, nasal discharge, oral lesions, sore throat, tinnitus or vertigo Allergy and Immunology ROS: negative for - hives or itchy/watery eyes Hematological and Lymphatic ROS: negative for - bleeding problems, bruising or swollen lymph  nodes Endocrine ROS: negative for - galactorrhea, hair pattern changes, polydipsia/polyuria or temperature intolerance Respiratory ROS: negative for - cough, hemoptysis, shortness of breath or wheezing Cardiovascular ROS: negative for - chest pain, dyspnea on exertion, edema or irregular heartbeat Gastrointestinal ROS: negative for - abdominal pain, diarrhea, hematemesis, nausea/vomiting or stool incontinence Genito-Urinary ROS: negative for - dysuria, hematuria, incontinence or urinary frequency/urgency Musculoskeletal ROS: negative for - joint swelling or muscular weakness Neurological ROS: as noted in HPI Dermatological ROS: negative for rash and skin lesion changes  Physical Examination: Blood pressure 114/77, pulse 86, temperature 98.4 F (36.9 C), temperature source Oral, resp. rate 18, height 5\' 2"  (1.575 m), weight 208 lb (94.348 kg), last menstrual period 05/28/2015.    Neurological Examination Mental Status: Alert, oriented, thought content appropriate.  Speech fluent without evidence of aphasia.  Able to follow 3 step commands without difficulty. Cranial Nerves: II: Discs flat bilaterally; Visual fields grossly normal, pupils equal, round, reactive to light and accommodation III,IV, VI: ptosis not present, extra-ocular motions intact bilaterally V,VII: smile symmetric, facial light touch sensation normal bilaterally VIII: hearing normal bilaterally IX,X: gag reflex present XI: bilateral shoulder shrug XII: midline tongue extension Motor: Right : Upper extremity   5/5    Left:     Upper extremity   5/5  Lower extremity   5/5     Lower extremity   5/5 Tone and bulk:normal tone throughout; no atrophy noted Sensory: Pinprick and light touch intact throughout, bilaterally Deep Tendon Reflexes: 2+ and symmetric throughout Plantars: Right: downgoing   Left: downgoing Cerebellar: normal finger-to-nose, normal rapid alternating movements and normal heel-to-shin test Gait:  normal gait and station  Laboratory Studies:   Basic Metabolic Panel:  Recent Labs Lab 03/08/16 1959  NA 134*  K 3.5  CL 107  CO2 19*  GLUCOSE 109*  BUN 7  CREATININE 0.52  CALCIUM 8.5*    Liver Function Tests:  Recent Labs Lab 03/08/16 1959  AST 18  ALT 10*  ALKPHOS 74  BILITOT 0.5  PROT 6.3*  ALBUMIN 3.0*   No results for input(s): LIPASE, AMYLASE in the last 168 hours. No results for input(s): AMMONIA in the last 168 hours.  CBC:  Recent Labs Lab 03/08/16 1959  WBC 15.5*  HGB 10.1*  HCT 29.3*  MCV 78.8*  PLT 194    Cardiac Enzymes: No results for input(s): CKTOTAL, CKMB, CKMBINDEX, TROPONINI in the last 168 hours.  BNP: Invalid input(s): POCBNP  CBG: No results for input(s): GLUCAP in the last 168 hours.  Microbiology: Results for orders placed or performed during the hospital encounter of 11/26/15  Urine culture     Status: None   Collection Time: 11/26/15  9:24 AM  Result Value Ref Range Status   Specimen Description URINE, CLEAN CATCH  Final   Special Requests NONE  Final   Culture INSIGNIFICANT GROWTH  Final   Report Status 11/27/2015 FINAL  Final    Coagulation Studies: No results for input(s): LABPROT, INR in the last 72 hours.  Urinalysis: No results for input(s): COLORURINE, LABSPEC, PHURINE, GLUCOSEU, HGBUR, BILIRUBINUR, KETONESUR, PROTEINUR, UROBILINOGEN, NITRITE, LEUKOCYTESUR in the last 168 hours.  Invalid input(s): APPERANCEUR  Lipid Panel:  No results found for: CHOL, TRIG, HDL, CHOLHDL, VLDL, LDLCALC  HgbA1C: No results found for: HGBA1C  Urine Drug Screen:     Component Value Date/Time   LABOPIA NONE DETECTED 03/08/2016 1959   LABBENZ NONE DETECTED 03/08/2016 1959   AMPHETMU NONE DETECTED 03/08/2016 1959   THCU NONE DETECTED 03/08/2016 1959   LABBARB NONE DETECTED 03/08/2016 1959    Alcohol Level: No results for input(s): ETH in the last 168 hours.  Other results: EKG: normal EKG, normal sinus rhythm,  unchanged from previous tracings.  Imaging: No results found.   Assessment/Plan: 25 y.o. female [place] . G25P1001 female at [redacted]w[redacted]d. Her pregnancy has been complicated by obesity, sickle cell trait, MJ use, UTI. Pt is complaining of bifrontal pressure like headache with slight radiation to right. No neck stiffness. Positive photophobia. No visual impairment.    Bifrontal headaches.  Not convinced true migraine.  - Pt is going to get 3g Mg, and phenergan  - If symptoms don't improve will obtain MRV to look for sinus thrombosis given history of SS.    Patricia Henson  03/09/2016, 11:30 AM

## 2016-03-09 NOTE — H&P (Signed)
OB History & Physical   History of Present Illness:  Chief Complaint: headache, abdominal cramping  HPI:  Patricia Henson is a 25 y.o. G8P1001 female at [redacted]w[redacted]d. Her pregnancy has been complicated by obesity, sickle cell trait, MJ use, UTI.Marland Kitchen    She reports contractions.   She denies leakage of fluid.   She denies vaginal bleeding.   She reports fetal movement.   She further reports a frontal headache, moderate-severe in acuity, since yesterday.  She has tried Tylenol without relief.  She noted some visual disturbances yesterday, though none today.  She denies RUQ pain.  Maternal Medical History:   Past Medical History  Diagnosis Date  . Anxiety     Past Surgical History  Procedure Laterality Date  . Cholecystectomy      Allergies  Allergen Reactions  . Penicillins Rash    Has patient had a PCN reaction causing immediate rash, facial/tongue/throat swelling, SOB or lightheadedness with hypotension: Yes Has patient had a PCN reaction causing severe rash involving mucus membranes or skin necrosis: No Has patient had a PCN reaction that required hospitalization  patient not sure Has patient had a PCN reaction occurring within the last 10 years: No If all of the above answers are "NO", then may proceed with Cephalosporin use.    Prior to Admission medications   Not on File    OB History  Gravida Para Term Preterm AB SAB TAB Ectopic Multiple Living  2 1 1       1     # Outcome Date GA Lbr Len/2nd Weight Sex Delivery Anes PTL Lv  2 Current           1 Term               Prenatal care site: [facility]  Social History: She  reports that she has never smoked. She has never used smokeless tobacco. She reports that she uses illicit drugs (Marijuana). She reports that she does not drink alcohol.  Family History: family history is not on file.   Review of Systems: Negative x 10 systems reviewed except as noted in the HPI.    Physical Exam:  Vital Signs: BP 138/89 mmHg   Pulse 89  Resp 16  Ht 5\' 2"  (1.575 m)  Wt 94.348 kg (208 lb)  BMI 38.03 kg/m2  LMP 05/28/2015 (LMP Unknown) General: no acute distress.  HEENT: normocephalic, atraumatic Heart: regular rate & rhythm.  No murmurs/rubs/gallops Lungs: clear to auscultation bilaterally Abdomen: soft, gravid, non-tender;  EFW: 6.5 pounds Pelvic:   Cervix: Dilation: Closed /   /    Per RN Extremities: non-tender, symmetric, no ema bilaterally.    Neurologic: Alert & oriented x 3.    Pertinent Results:  Prenatal Labs: Blood type/Rh A positive  Antibody screen negative  Rubella Immune  Varicella Immune    RPR NR  HBsAg negative  HIV negative  GC negative  Chlamydia negative  Genetic screening declined  1 hour GTT 142  3 hour GTT Did not complete (normal hgb A1c)  GBS unknown   Baseline FHR: 150  beats/min   Variability: moderate   Accelerations: present   Decelerations: present initially appeared late in nature vs variable, improved with position changes and resolved. Contractions: present frequency: 3 q 10 min initially Overall assessment: category 1 after adjustments to position. Category 2 initially  Assessment:  Patricia Henson is a 25 y.o. G19P1001 female at [redacted]w[redacted]d with elevated blood pressure and headache, concern for preeclampsia  with severe features.   Plan:  1. Admit to Labor & Delivery  2. CBC, T&S, Clrs, IVF, HELLP labs with creatinine, urine p/c ratio 3. GBS unknown.   4. [place] well-being: reassuring after position changes, initially concerning 5. Will continue to evaluate for severe features.   Will Bonnet, MD 03/09/2016 12:04 AM

## 2016-03-11 LAB — CULTURE, BETA STREP (GROUP B ONLY)

## 2016-03-14 ENCOUNTER — Observation Stay
Admission: EM | Admit: 2016-03-14 | Discharge: 2016-03-14 | Disposition: A | Discharge status: Home / Self Care | Payer: Medicaid Other | Attending: Obstetrics and Gynecology | Admitting: Obstetrics and Gynecology

## 2016-03-14 ENCOUNTER — Encounter: Payer: Self-pay | Admitting: *Deleted

## 2016-03-14 DIAGNOSIS — O26893 Other specified pregnancy related conditions, third trimester: Secondary | ICD-10-CM

## 2016-03-14 DIAGNOSIS — Z3A37 37 weeks gestation of pregnancy: Secondary | ICD-10-CM | POA: Diagnosis not present

## 2016-03-14 DIAGNOSIS — O99013 Anemia complicating pregnancy, third trimester: Principal | ICD-10-CM | POA: Insufficient documentation

## 2016-03-14 DIAGNOSIS — D573 Sickle-cell trait: Secondary | ICD-10-CM | POA: Diagnosis not present

## 2016-03-14 DIAGNOSIS — Z880 Allergy status to penicillin: Secondary | ICD-10-CM | POA: Insufficient documentation

## 2016-03-14 DIAGNOSIS — O98813 Other maternal infectious and parasitic diseases complicating pregnancy, third trimester: Secondary | ICD-10-CM | POA: Diagnosis not present

## 2016-03-14 DIAGNOSIS — B373 Candidiasis of vulva and vagina: Secondary | ICD-10-CM | POA: Diagnosis not present

## 2016-03-14 DIAGNOSIS — N898 Other specified noninflammatory disorders of vagina: Secondary | ICD-10-CM | POA: Diagnosis present

## 2016-03-14 LAB — GLUCOSE, CAPILLARY: Glucose-Capillary: 68 mg/dL (ref 65–99)

## 2016-03-14 MED ORDER — FLUCONAZOLE 150 MG PO TABS
150.0000 mg | ORAL_TABLET | Freq: Every day | ORAL | Status: DC
Start: 2016-03-14 — End: 2016-03-18

## 2016-03-14 NOTE — OB Triage Note (Signed)
G2P1 presents at [redacted]w[redacted]d with c/o leaking of fluid and symptoms of a yeast infection

## 2016-03-14 NOTE — Discharge Instructions (Signed)
Continue taking your blood sugar log as directed Expect a call today for an appointment tomorrow to go over this     We will discuss your surgery once again in detail at your post-op visit in two to four weeks. If you havent already done so, please call to make your appointment as soon as possible.  [facility]  [address] [place], [place] [address] [place], [place] [address]  Phone: [phone] Phone: [phone]  Fax: [phone] Fax: [phone]

## 2016-03-14 NOTE — Final Progress Note (Signed)
Obstetrics Admission History & Physical  03/14/2016 - 2:43 PM Primary OBGYN: [facility]  Chief Complaint: leakage of fluid since yesterday  History of Present Illness  25 y.o. G2P1001 @ [redacted]w[redacted]d (Dating: EDC 5/8, dated by 8wk u/s ), with the above CC. Pregnancy complicated by: BMI 37, [place] trait, +THC during this pregnancy, h/o UTI with negative TOC, GBS and need for clinda tx, recent diagnosis of chlamydia with treatment this week, failed 1hr and 3hr never completed  No VB, decreased FM. Pt thinks she has a yeast infection. Prior to MD eval, pt was checked and noted to be 1cm. Pt states she had q73m UCs initially but doesn't feel them anymore. She states she took the azithro that I gave her on 4/18 w/o issue. She doesn't have her BS log or her meter but states she thinks a fasting was in the 120s and her other 2hr PP values were above the number on the chart I gave her.  Review of Systems: P her 12 point review of systems is negative or as noted in the History of Present Illness.  PMHx:  Past Medical History  Diagnosis Date  . Anxiety   . Gestational diabetes    PSHx:  Past Surgical History  Procedure Laterality Date  . Cholecystectomy     Medications:  No prescriptions prior to admission    Allergies: is allergic to penicillins. OBHx:  OB History  Gravida Para Term Preterm AB SAB TAB Ectopic Multiple Living  2 1 1       1     # Outcome Date GA Lbr Len/2nd Weight Sex Delivery Anes PTL Lv  2 Current           1 Term              11/2013: 7lbs 2oz           FHx:  Family History  Problem Relation Age of Onset  . Diabetes Mother    Soc Hx:  Social History   Social History  . Marital Status: Single    Spouse Name: N/A  . Number of Children: N/A  . Years of Education: N/A   Occupational History  . Not on file.   Social History Main Topics  . Smoking status: Never Smoker   . Smokeless tobacco: Never Used  . Alcohol Use: No  . Drug Use: Yes    Special: Marijuana  .  Sexual Activity: Yes    Birth Control/ Protection: None   Other Topics Concern  . Not on file   Social History Narrative    Objective   Filed Vitals:   03/14/16 1250  BP: 110/59  Pulse: 147  Temp: 97.9 F (36.6 C)  TempSrc: Oral  Resp: 20   EFM: 130 +accels, no decel, mod var  Toco: quiet   General: Well nourished, well developed female in no acute distress.  Skin:  Warm and dry.  CV: normal rhythm, no MRGs. HR 90s Respiratory:  Normal respiratory effort Abdomen: gravid, nttp Neuro/Psych:  Normal mood and affect.   SSE: visually closed, cottage cheese like white/slightly green and yellow d/c. Negative cough test, no pooling SVE: externally 1 but closed internally/long/high, nttp  Labs  Wet prep with yeast and multiple WBCs, occasional clue cells, nitrazine pos. No fern  Radiology none  Assessment & Plan   25 y.o. G2P1001 @ [redacted]w[redacted]d with yeast infection and newly treated CT. Pt doing well *IUP: rNST *ROM eval: negative. C/w yeast. Will treat with single dose  diflucan and not monistat 7 due to issues with patient compliance *failed 1hr: pt vomited 3hr and never followed up on in clinic until I saw her last week. Last meal was before noon. Will check random BS and if <120, okay for d/c to home. Message sent to office to put on my schedule tomorrow for HROB. Pt aware to look out for call. Pt told of risks if BS is running high, with risks to fetus  *Dispo: d/c to home after BS check  Durene Romans. MD [place] Pager [phone].cpv

## 2016-03-18 ENCOUNTER — Observation Stay
Admission: EM | Admit: 2016-03-18 | Discharge: 2016-03-18 | Disposition: A | Discharge status: Home / Self Care | Payer: Medicaid Other | Attending: Obstetrics and Gynecology | Admitting: Obstetrics and Gynecology

## 2016-03-18 DIAGNOSIS — Z880 Allergy status to penicillin: Secondary | ICD-10-CM | POA: Diagnosis not present

## 2016-03-18 DIAGNOSIS — Z3A38 38 weeks gestation of pregnancy: Secondary | ICD-10-CM | POA: Insufficient documentation

## 2016-03-18 DIAGNOSIS — O99013 Anemia complicating pregnancy, third trimester: Secondary | ICD-10-CM | POA: Insufficient documentation

## 2016-03-18 DIAGNOSIS — O99343 Other mental disorders complicating pregnancy, third trimester: Secondary | ICD-10-CM | POA: Insufficient documentation

## 2016-03-18 DIAGNOSIS — Z3490 Encounter for supervision of normal pregnancy, unspecified, unspecified trimester: Secondary | ICD-10-CM

## 2016-03-18 DIAGNOSIS — O471 False labor at or after 37 completed weeks of gestation: Secondary | ICD-10-CM | POA: Diagnosis present

## 2016-03-18 DIAGNOSIS — D573 Sickle-cell trait: Secondary | ICD-10-CM | POA: Insufficient documentation

## 2016-03-18 DIAGNOSIS — F419 Anxiety disorder, unspecified: Secondary | ICD-10-CM | POA: Diagnosis not present

## 2016-03-18 DIAGNOSIS — O479 False labor, unspecified: Secondary | ICD-10-CM | POA: Diagnosis present

## 2016-03-18 LAB — CBC
HCT: 31.5 % — ABNORMAL LOW (ref 35.0–47.0)
HEMOGLOBIN: 11 g/dL — AB (ref 12.0–16.0)
MCH: 27.2 pg (ref 26.0–34.0)
MCHC: 35 g/dL (ref 32.0–36.0)
MCV: 77.9 fL — ABNORMAL LOW (ref 80.0–100.0)
Platelets: 223 10*3/uL (ref 150–440)
RBC: 4.04 MIL/uL (ref 3.80–5.20)
RDW: 14.5 % (ref 11.5–14.5)
WBC: 14 10*3/uL — AB (ref 3.6–11.0)

## 2016-03-18 LAB — PROTEIN / CREATININE RATIO, URINE
CREATININE, URINE: 133 mg/dL
PROTEIN CREATININE RATIO: 0.11 mg/mg{creat} (ref 0.00–0.15)
TOTAL PROTEIN, URINE: 15 mg/dL

## 2016-03-18 LAB — COMPREHENSIVE METABOLIC PANEL
ALBUMIN: 3 g/dL — AB (ref 3.5–5.0)
ALK PHOS: 84 U/L (ref 38–126)
ALT: 12 U/L — ABNORMAL LOW (ref 14–54)
ANION GAP: 7 (ref 5–15)
AST: 20 U/L (ref 15–41)
BILIRUBIN TOTAL: 0.5 mg/dL (ref 0.3–1.2)
BUN: 10 mg/dL (ref 6–20)
CALCIUM: 9 mg/dL (ref 8.9–10.3)
CO2: 23 mmol/L (ref 22–32)
Chloride: 107 mmol/L (ref 101–111)
Creatinine, Ser: 0.65 mg/dL (ref 0.44–1.00)
GFR calc Af Amer: >60 mL/min (ref >60)
GLUCOSE: 124 mg/dL — AB (ref 65–99)
Potassium: 3.7 mmol/L (ref 3.5–5.1)
Sodium: 137 mmol/L (ref 135–145)
TOTAL PROTEIN: 6.7 g/dL (ref 6.5–8.1)

## 2016-03-18 LAB — URINALYSIS COMPLETE WITH MICROSCOPIC (ARMC ONLY)
BILIRUBIN URINE: NEGATIVE
GLUCOSE, UA: NEGATIVE mg/dL
HGB URINE DIPSTICK: NEGATIVE
KETONES UR: NEGATIVE mg/dL
LEUKOCYTES UA: NEGATIVE
NITRITE: NEGATIVE
PH: 6 (ref 5.0–8.0)
Protein, ur: NEGATIVE mg/dL
SPECIFIC GRAVITY, URINE: 1.017 (ref 1.005–1.030)

## 2016-03-18 MED ORDER — LACTATED RINGERS IV SOLN
INTRAVENOUS | Status: DC
  Administered 2016-03-18: 05:00:00 via INTRAVENOUS

## 2016-03-18 NOTE — OB Triage Note (Signed)
Pt arrived to OBS rm 3 from ED with c/o contractions starting "6hours" ago, irregular but painful with 8.5/10 pain. No gush of fluid or bleeding. Pt placed on monitors and oriented to room.

## 2016-03-18 NOTE — Discharge Instructions (Signed)
Please get plenty of rest and water. Return to emergency room if symptoms worse. Discharge instructions given and patient stated she understood.

## 2016-03-18 NOTE — Final Progress Note (Signed)
Obstetrics Admission History & Physical  03/18/2016 - 6:45 AM Primary OBGYN: [facility]  Chief Complaint: uterine contractions  History of Present Illness  25 y.o. G2P1001 @ [redacted]w[redacted]d (Dating: EDC 5/8), with the above CC. Pregnancy complicated by: [place] trait, BMI 37, h/o UTI with neg TOC, GBS pos, +THC, h/o CT and recent tx in early April and failed 1hr and 3h GTT not completed.  Ms. KEYAIRA BERNABE states that she started having regular, frequent UCs this AM but feels that they've spaced out since coming to triage. No decreased FM, LOF, VB (prior to SVE) or s/s of pre-x. Pt had to mild range BPs at 0330 and 0430 with one of them b/c she was on her side. SVE at those times aw unchanged at 2/60 per RN. Pre-eclampsia labs requested and negative PC, CBC and CMP except for 124 glucose. Pt states her BS log is acceptable; she doesn't have it on her now.   Review of Systems: her 12 point review of systems is negative or as noted in the History of Present Illness.   PMHx:  Past Medical History  Diagnosis Date  . Anxiety   . Gestational diabetes    PSHx:  Past Surgical History  Procedure Laterality Date  . Cholecystectomy     Medications:  None  Allergies: is allergic to penicillins. OBHx:  OB History  Gravida Para Term Preterm AB SAB TAB Ectopic Multiple Living  2 1 1       1     # Outcome Date GA Lbr Len/2nd Weight Sex Delivery Anes PTL Lv  2 Current           1 Term             11/2013: 7lbs 2oz           FHx:  Family History  Problem Relation Age of Onset  . Diabetes Mother    Soc Hx:  Social History   Social History  . Marital Status: Single    Spouse Name: N/A  . Number of Children: N/A  . Years of Education: N/A   Occupational History  . Not on file.   Social History Main Topics  . Smoking status: Never Smoker   . Smokeless tobacco: Never Used  . Alcohol Use: No  . Drug Use: Yes    Special: Marijuana  . Sexual Activity: Yes    Birth Control/ Protection: None    Other Topics Concern  . Not on file   Social History Narrative    Objective   Patient Vitals for the past 6 hrs:  BP Temp Temp src Pulse  03/18/16 0601 119/65 mmHg - - 88  03/18/16 0548 119/74 mmHg - - 84  03/18/16 0531 125/69 mmHg - - 89  03/18/16 0522 118/66 mmHg - - 98  03/18/16 0431 (!) 132/92 mmHg - - 84  03/18/16 0416 129/86 mmHg - - (!) 116  03/18/16 0401 136/61 mmHg - - (!) 123  03/18/16 0346 137/81 mmHg - - (!) 116  03/18/16 0331 (!) 130/91 mmHg - - (!) 110  03/18/16 0323 136/77 mmHg 98.6 F (37 C) Oral (!) 125   EFM: 145 baseline, +accels, no decel, mod var  Toco: +irritability, ?q3-42m and initially ?q1-2  General: Well nourished, well developed female in no acute distress.  Skin:  Warm and dry.  Cardiovascular: HR 90s, nl s1 and s2 Respiratory:  Clear to auscultation bilateral. Normal respiratory effort Abdomen: gravid, nttp, palpates soft Neuro/Psych:  Normal mood and  affect. 1+ brachial b/l  SVE: 2/50/-2/cephalic Leopolds/EFW: cephalic  Labs  As above  Radiology none   Assessment & Plan   25 y.o. G2P1001 @ [redacted]w[redacted]d with negative labor eval *IUP: rNST *EOL: negative. Precautions given D/c to home; pt told to keep appt for tomorrow  Durene Romans. MD [facility] Pager [phone]

## 2016-03-20 ENCOUNTER — Observation Stay
Admission: EM | Admit: 2016-03-20 | Discharge: 2016-03-20 | Disposition: A | Discharge status: Home / Self Care | Payer: Medicaid Other | Attending: Certified Nurse Midwife | Admitting: Certified Nurse Midwife

## 2016-03-20 ENCOUNTER — Encounter: Payer: Self-pay | Admitting: *Deleted

## 2016-03-20 DIAGNOSIS — R197 Diarrhea, unspecified: Secondary | ICD-10-CM | POA: Diagnosis not present

## 2016-03-20 DIAGNOSIS — O219 Vomiting of pregnancy, unspecified: Secondary | ICD-10-CM | POA: Diagnosis present

## 2016-03-20 DIAGNOSIS — O99613 Diseases of the digestive system complicating pregnancy, third trimester: Secondary | ICD-10-CM | POA: Diagnosis not present

## 2016-03-20 DIAGNOSIS — O99013 Anemia complicating pregnancy, third trimester: Secondary | ICD-10-CM | POA: Diagnosis not present

## 2016-03-20 DIAGNOSIS — Z3A38 38 weeks gestation of pregnancy: Secondary | ICD-10-CM | POA: Insufficient documentation

## 2016-03-20 DIAGNOSIS — O99820 Streptococcus B carrier state complicating pregnancy: Secondary | ICD-10-CM | POA: Insufficient documentation

## 2016-03-20 DIAGNOSIS — R112 Nausea with vomiting, unspecified: Secondary | ICD-10-CM | POA: Diagnosis not present

## 2016-03-20 DIAGNOSIS — D573 Sickle-cell trait: Secondary | ICD-10-CM | POA: Insufficient documentation

## 2016-03-20 DIAGNOSIS — Z87442 Personal history of urinary calculi: Secondary | ICD-10-CM | POA: Insufficient documentation

## 2016-03-20 LAB — URINALYSIS COMPLETE WITH MICROSCOPIC (ARMC ONLY)
BILIRUBIN URINE: NEGATIVE
GLUCOSE, UA: NEGATIVE mg/dL
Leukocytes, UA: NEGATIVE
Nitrite: NEGATIVE
PH: 6 (ref 5.0–8.0)
Protein, ur: 30 mg/dL — AB
Specific Gravity, Urine: 1.023 (ref 1.005–1.030)

## 2016-03-20 LAB — BASIC METABOLIC PANEL
ANION GAP: 7 (ref 5–15)
BUN: 9 mg/dL (ref 6–20)
CALCIUM: 9.2 mg/dL (ref 8.9–10.3)
CHLORIDE: 108 mmol/L (ref 101–111)
CO2: 21 mmol/L — AB (ref 22–32)
Creatinine, Ser: 0.56 mg/dL (ref 0.44–1.00)
GFR calc non Af Amer: >60 mL/min (ref >60)
Glucose, Bld: 83 mg/dL (ref 65–99)
Potassium: 3.7 mmol/L (ref 3.5–5.1)
Sodium: 136 mmol/L (ref 135–145)

## 2016-03-20 LAB — GLUCOSE, CAPILLARY: GLUCOSE-CAPILLARY: 117 mg/dL — AB (ref 65–99)

## 2016-03-20 MED ORDER — LACTATED RINGERS IV SOLN: INTRAVENOUS | Status: DC

## 2016-03-20 MED ORDER — PROCHLORPERAZINE EDISYLATE 5 MG/ML IJ SOLN
5.0000 mg | Freq: Four times a day (QID) | INTRAMUSCULAR | Status: DC | PRN
  Administered 2016-03-20: 5 mg via INTRAVENOUS
  Filled 2016-03-20 (×2): qty 1

## 2016-03-20 MED ORDER — LACTATED RINGERS IV SOLN
500.0000 mL | Freq: Once | INTRAVENOUS | Status: AC
  Administered 2016-03-20: 500 mL via INTRAVENOUS

## 2016-03-20 MED ORDER — SODIUM CHLORIDE FLUSH 0.9 % IV SOLN
INTRAVENOUS | Status: AC
  Administered 2016-03-20: 10 mL
  Filled 2016-03-20: qty 10

## 2016-03-20 NOTE — Progress Notes (Addendum)
Pt recently returned to bed from restroom, reports no nausea/vomiting/diarrhea in the past hour, no longer feeling hot or flushed, asked for room temp to be turned up and needing warm blanket. Pt tolerating crackers and gingerale, pt asked and provided with sandwich tray d/t her feeling hungry after not being able to eat for sometime. VSS, afebrile. Pt says she is feeling better to go home now.

## 2016-03-20 NOTE — Progress Notes (Signed)
Pt aware she would need to wait and sign discharge papers, RN returned to room to provide pt with discharge paperwork, pt not present in room, appears pt had gotten dressed and left prior to RN returning to room to complete discharge process. When last in room pt was in no distress and getting dressed to leave with family member that was coming to give her a ride home.

## 2016-03-20 NOTE — OB Triage Note (Signed)
N/V/D since 2000 yesterday evening. Diarrhea x 4 today. Vomiting x 5 today. Reports last meal was shrimp, yesterday evening. [name]

## 2016-03-20 NOTE — Progress Notes (Signed)
Pt lying on Lt side, states she feels better. Spoke with Jaclyn Shaggy, CNM about pt attempting po. Pt provided with crackers and diet gingerale for po challenge.

## 2016-03-20 NOTE — OB Triage Note (Signed)
FHT reactive and FHR returned to baseline, 130's since period of tachycardia, pt feeling better since nausea med, denies n/v/d, VSS, afebrile. Pt attempted sandwich and liquids, tolerating po well.

## 2016-03-20 NOTE — Final Progress Note (Signed)
Physician Final Progress Note  Patient ID: Patricia Henson MRN: [id number] DOB/AGE: May 12, 1991 25 y.o.  Admit date: 03/20/2016 Admitting provider: Will Bonnet, MD/ Jesus Genera. Danise Mina, CNM Discharge date: 03/20/2016   Admission Diagnoses: IUP at 38.2 weeks with nausea/ vomiting/diarrhea  Discharge Diagnoses: same   Consults: none  Significant Findings/ Diagnostic Studies: 25 yo G2 P1001 with EDC=2016-04-08 presented at 38.2 weeks with c/o nauseated and vomiting since 2000 last night. Had vomited 5 times today and had diarrhea x4 times. Last ate last night and had cooked some shrimp. No one in family ill with similar sx. Had kept down only some water today. She took a zofran 3 hrs PTA (unsure if 4 or 8 mgm). Tried po hydration here but she vomited again, so IV was begun and she was given Compazine 5 mgm IV. She felt some hot and flushed after that was administered, but she was able to eat a sandwich and keep down fluids after the Compazine was given. [facility] at [facility] remarkable for BMI=37, 2 # weight gain with pregnancy, + MJ in Jan, concerns for fetal growth with a EFW 15.7% and AC 8.7%, sickle cell trait, E. Coli UTI 12/26 , presumed GDM (elevated one hr/ unable to complete 3hr GTT), positive Chlamydia on 4/10, and GBS positive. Medical hx positive for kidney stones. Current meds: Zofran prn  Exam: General : appeared to be in NAD BP 129/72 mmHg  Pulse 98  Temp(Src) 98 F (36.7 C) (Axillary)  Resp 16  Ht 5\' 2"  (1.575 m)  Wt 92.987 kg (205 lb)  BMI 37.49 kg/m2  LMP 05/28/2015 (LMP Unknown) Abdomen: BS active, NT, No RUQ pain, Fundus NT. Cephalic on Leopold's FHR intially FHR 135-140 baseline with accelerations, moderate variability. Became very active after IV fluids and Compazine and was mildly tachycardic with fetal movement, but prior to discharge the baseline was 140s to 150. No decelerations. Toco: irregular mild contractions Cervix: still 1.5/50% but baby's station now  -1 (was -3 yesterday) DTR's: +1 Results for orders placed or performed during the hospital encounter of 03/20/16 (from the past 24 hour(s))  Glucose, capillary     Status: Abnormal   Collection Time: 03/20/16  4:35 PM  Result Value Ref Range   Glucose-Capillary 117 (H) 65 - 99 mg/dL  Urinalysis complete, with microscopic ([facility] only)     Status: Abnormal   Collection Time: 03/20/16  4:49 PM  Result Value Ref Range   Color, Urine YELLOW (A) YELLOW   APPearance CLEAR (A) CLEAR   Glucose, UA NEGATIVE NEGATIVE mg/dL   Bilirubin Urine NEGATIVE NEGATIVE   Ketones, ur TRACE (A) NEGATIVE mg/dL   Specific Gravity, Urine 1.023 1.005 - 1.030   Hgb urine dipstick 1+ (A) NEGATIVE   pH 6.0 5.0 - 8.0   Protein, ur 30 (A) NEGATIVE mg/dL   Nitrite NEGATIVE NEGATIVE   Leukocytes, UA NEGATIVE NEGATIVE   RBC / HPF 6-30 0 - 5 RBC/hpf   WBC, UA 0-5 0 - 5 WBC/hpf   Bacteria, UA RARE (A) NONE SEEN   Squamous Epithelial / LPF 0-5 (A) NONE SEEN   Mucous PRESENT    Ca Oxalate Crys, UA PRESENT   Basic metabolic panel     Status: Abnormal   Collection Time: 03/20/16  7:03 PM  Result Value Ref Range   Sodium 136 135 - 145 mmol/L   Potassium 3.7 3.5 - 5.1 mmol/L   Chloride 108 101 - 111 mmol/L   CO2 21 (L) 22 -  32 mmol/L   Glucose, Bld 83 65 - 99 mg/dL   BUN 9 6 - 20 mg/dL   Creatinine, Ser 0.56 0.44 - 1.00 mg/dL   Calcium 9.2 8.9 - 10.3 mg/dL   GFR calc non Af Amer >60 >60 mL/min   GFR calc Af Amer >60 >60 mL/min   Anion gap 7 5 - 15   Initial FSBS=117  A: Possible viral gastroenteritis at 38.2 weeks-improved, keeping down food/fluids Reassuring FHT P: DC home per her request Bland diet for the next 24-48 hours then diet as tolerated FU/ROB in 2 days  Procedures: none  Discharge Condition: stable  Disposition: 01-Home or Self Care  Diet: Encourage fluids and bland  Discharge Activity: Activity as tolerated     Medication List    ASK your doctor about these medications         ondansetron 8 MG tablet  Commonly known as:  ZOFRAN  Take 8 mg by mouth every 8 (eight) hours as needed for nausea or vomiting.  Notes to Patient:  Continue taking as prescribed, prn nausea/vomiting           Follow-up Information    Follow up with [facility]. Go in 2 days.   Why:  If symptoms worsen, As needed      Total time spent taking care of this patient: 20 minutes  Signed: Jaiana Sheffer 03/20/2016, 11:04 PM

## 2016-03-20 NOTE — Progress Notes (Signed)
Pt becoming flushed and hot after receiving Compazine for n/v. CNM at bedside to assess pt. Covers removed, room temp adjusted, cool wash cloth to forehead. Fetal tachycardia noted since pt reports feeling hot and flushed. Will continue to monitor.

## 2016-03-20 NOTE — Discharge Instructions (Signed)
May take otc immodium for diarrhea if needed. Tylenol okay to take prn pain, follow label instructions.

## 2016-03-25 ENCOUNTER — Inpatient Hospital Stay
Admission: EM | Admit: 2016-03-25 | Discharge: 2016-03-28 | DRG: 775 | Disposition: A | Discharge status: Home / Self Care | Payer: Medicaid Other | Attending: Obstetrics and Gynecology | Admitting: Obstetrics and Gynecology

## 2016-03-25 DIAGNOSIS — O9081 Anemia of the puerperium: Secondary | ICD-10-CM | POA: Diagnosis not present

## 2016-03-25 DIAGNOSIS — Z3A39 39 weeks gestation of pregnancy: Secondary | ICD-10-CM | POA: Diagnosis not present

## 2016-03-25 DIAGNOSIS — O24429 Gestational diabetes mellitus in childbirth, unspecified control: Principal | ICD-10-CM | POA: Diagnosis present

## 2016-03-25 DIAGNOSIS — IMO0002 Reserved for concepts with insufficient information to code with codable children: Secondary | ICD-10-CM | POA: Diagnosis present

## 2016-03-25 DIAGNOSIS — D509 Iron deficiency anemia, unspecified: Secondary | ICD-10-CM | POA: Diagnosis present

## 2016-03-25 LAB — URINE DRUG SCREEN, QUALITATIVE (ARMC ONLY)
Amphetamines, Ur Screen: NOT DETECTED
Barbiturates, Ur Screen: NOT DETECTED
Benzodiazepine, Ur Scrn: NOT DETECTED
CANNABINOID 50 NG, UR ~~LOC~~: NOT DETECTED
Cocaine Metabolite,Ur ~~LOC~~: NOT DETECTED
MDMA (Ecstasy)Ur Screen: NOT DETECTED
Methadone Scn, Ur: NOT DETECTED
Opiate, Ur Screen: NOT DETECTED
PHENCYCLIDINE (PCP) UR S: NOT DETECTED
Tricyclic, Ur Screen: NOT DETECTED

## 2016-03-25 LAB — CBC
HCT: 30.2 % — ABNORMAL LOW (ref 35.0–47.0)
Hemoglobin: 10.5 g/dL — ABNORMAL LOW (ref 12.0–16.0)
MCH: 26.8 pg (ref 26.0–34.0)
MCHC: 34.8 g/dL (ref 32.0–36.0)
MCV: 77.1 fL — AB (ref 80.0–100.0)
PLATELETS: 224 10*3/uL (ref 150–440)
RBC: 3.92 MIL/uL (ref 3.80–5.20)
RDW: 14.8 % — ABNORMAL HIGH (ref 11.5–14.5)
WBC: 14.2 10*3/uL — AB (ref 3.6–11.0)

## 2016-03-25 LAB — COMPREHENSIVE METABOLIC PANEL
ALT: 10 U/L — ABNORMAL LOW (ref 14–54)
AST: 19 U/L (ref 15–41)
Albumin: 3 g/dL — ABNORMAL LOW (ref 3.5–5.0)
Alkaline Phosphatase: 84 U/L (ref 38–126)
Anion gap: 10 (ref 5–15)
BUN: 8 mg/dL (ref 6–20)
CALCIUM: 9.1 mg/dL (ref 8.9–10.3)
CHLORIDE: 105 mmol/L (ref 101–111)
CO2: 21 mmol/L — AB (ref 22–32)
Creatinine, Ser: 0.49 mg/dL (ref 0.44–1.00)
GFR calc non Af Amer: >60 mL/min (ref >60)
Glucose, Bld: 110 mg/dL — ABNORMAL HIGH (ref 65–99)
POTASSIUM: 3.7 mmol/L (ref 3.5–5.1)
SODIUM: 136 mmol/L (ref 135–145)
TOTAL PROTEIN: 6.7 g/dL (ref 6.5–8.1)
Total Bilirubin: 0.1 mg/dL — ABNORMAL LOW (ref 0.3–1.2)

## 2016-03-25 LAB — PROTEIN / CREATININE RATIO, URINE
CREATININE, URINE: 132 mg/dL
PROTEIN CREATININE RATIO: 0.06 mg/mg{creat} (ref 0.00–0.15)
TOTAL PROTEIN, URINE: 8 mg/dL

## 2016-03-25 MED ORDER — ONDANSETRON HCL 4 MG/2ML IJ SOLN: 4.0000 mg | Freq: Four times a day (QID) | INTRAMUSCULAR | Status: DC | PRN

## 2016-03-25 MED ORDER — LIDOCAINE HCL (PF) 1 % IJ SOLN: 30.0000 mL | INTRAMUSCULAR | Status: DC | PRN

## 2016-03-25 MED ORDER — FENTANYL CITRATE (PF) 100 MCG/2ML IJ SOLN
50.0000 ug | INTRAMUSCULAR | Status: AC | PRN
  Administered 2016-03-26 (×3): 50 ug via INTRAVENOUS
  Filled 2016-03-25 (×3): qty 2

## 2016-03-25 MED ORDER — CITRIC ACID-SODIUM CITRATE 334-500 MG/5ML PO SOLN
30.0000 mL | ORAL | Status: DC | PRN
Start: 2016-03-25 — End: 2016-03-26

## 2016-03-25 MED ORDER — TERBUTALINE SULFATE 1 MG/ML IJ SOLN: 0.2500 mg | Freq: Once | INTRAMUSCULAR | Status: DC | PRN

## 2016-03-25 MED ORDER — OXYTOCIN 40 UNITS IN LACTATED RINGERS INFUSION - SIMPLE MED
2.5000 [IU]/h | INTRAVENOUS | Status: DC
Start: 2016-03-25 — End: 2016-03-26
  Filled 2016-03-25: qty 1000

## 2016-03-25 MED ORDER — LACTATED RINGERS IV SOLN
INTRAVENOUS | Status: DC
  Administered 2016-03-25: 22:00:00 via INTRAVENOUS
  Administered 2016-03-26: 1000 mL via INTRAVENOUS

## 2016-03-25 MED ORDER — OXYTOCIN BOLUS FROM INFUSION: 500.0000 mL | INTRAVENOUS | Status: DC

## 2016-03-25 MED ORDER — LACTATED RINGERS IV SOLN
500.0000 mL | INTRAVENOUS | Status: DC | PRN
Start: 2016-03-25 — End: 2016-03-26

## 2016-03-25 MED ORDER — ACETAMINOPHEN 325 MG PO TABS: 650.0000 mg | ORAL_TABLET | ORAL | Status: DC | PRN

## 2016-03-25 MED ORDER — CLINDAMYCIN PHOSPHATE 900 MG/50ML IV SOLN
900.0000 mg | Freq: Three times a day (TID) | INTRAVENOUS | Status: DC
  Administered 2016-03-25 – 2016-03-26 (×3): 900 mg via INTRAVENOUS
  Filled 2016-03-25 (×6): qty 50

## 2016-03-25 MED ORDER — DINOPROSTONE 10 MG VA INST
10.0000 mg | VAGINAL_INSERT | Freq: Once | VAGINAL | Status: AC
  Administered 2016-03-25: 10 mg via VAGINAL
  Filled 2016-03-25: qty 1

## 2016-03-25 NOTE — H&P (Signed)
Office H&P reviewed. G2P1 @ 17/0 IOL for FGR (small AC) and ?GDM.  FT/50/-3 in the office on 4/28. Mild range BP on admission. Pre-x labs added to admit labs Leopolds cephalic.   Fetus category I with accels annd negative UCs  Pt denies any labor or pre-x s/s  Follow up serial BPs Follow up admit labs, pre-x labs BS check on admission 90s. Will do q4h in latent and q2h in active if all have been normal. Will let peds know concern for possible GDM UDS for h/o THC during pregnancy Start clinda with ROM and/or cx dilation 4cm or greater Will place cervidil since labs are drawn Pt amenable to plan   Durene Romans MD [place]  Pager: [phone]

## 2016-03-26 ENCOUNTER — Inpatient Hospital Stay: Payer: Medicaid Other | Admitting: Anesthesiology

## 2016-03-26 ENCOUNTER — Encounter: Payer: Self-pay | Admitting: Anesthesiology

## 2016-03-26 LAB — GLUCOSE, CAPILLARY
Glucose-Capillary: 99 mg/dL (ref 65–99)
Glucose-Capillary: 78 mg/dL (ref 65–99)
Glucose-Capillary: 88 mg/dL (ref 65–99)
Glucose-Capillary: 71 mg/dL (ref 65–99)

## 2016-03-26 LAB — TYPE AND SCREEN
ABO/RH(D): A POS
Antibody Screen: NEGATIVE

## 2016-03-26 MED ORDER — IBUPROFEN 600 MG PO TABS
600.0000 mg | ORAL_TABLET | Freq: Four times a day (QID) | ORAL | Status: DC
  Administered 2016-03-26 – 2016-03-28 (×6): 600 mg via ORAL
  Filled 2016-03-26 (×6): qty 1

## 2016-03-26 MED ORDER — DIBUCAINE 1 % RE OINT: 1.0000 "application " | TOPICAL_OINTMENT | RECTAL | Status: DC | PRN

## 2016-03-26 MED ORDER — LIDOCAINE HCL (PF) 1 % IJ SOLN
INTRAMUSCULAR | Status: AC
  Filled 2016-03-26: qty 30

## 2016-03-26 MED ORDER — COCONUT OIL OIL: 1.0000 "application " | TOPICAL_OIL | Status: DC | PRN

## 2016-03-26 MED ORDER — FENTANYL 2.5 MCG/ML W/ROPIVACAINE 0.2% IN NS 100 ML EPIDURAL INFUSION (ARMC-ANES)
10.0000 mL/h | EPIDURAL | Status: DC
  Administered 2016-03-26: 10 mL/h via EPIDURAL

## 2016-03-26 MED ORDER — EPHEDRINE 5 MG/ML INJ
10.0000 mg | INTRAVENOUS | Status: DC | PRN
  Filled 2016-03-26: qty 2

## 2016-03-26 MED ORDER — ACETAMINOPHEN 325 MG PO TABS: 650.0000 mg | ORAL_TABLET | ORAL | Status: DC | PRN

## 2016-03-26 MED ORDER — BUPIVACAINE HCL (PF) 0.25 % IJ SOLN
INTRAMUSCULAR | Status: DC | PRN
  Administered 2016-03-26 (×2): 4 mL via EPIDURAL

## 2016-03-26 MED ORDER — FENTANYL 2.5 MCG/ML W/ROPIVACAINE 0.2% IN NS 100 ML EPIDURAL INFUSION (ARMC-ANES)
EPIDURAL | Status: AC
  Filled 2016-03-26: qty 100

## 2016-03-26 MED ORDER — LIDOCAINE HCL (PF) 1 % IJ SOLN
INTRAMUSCULAR | Status: DC | PRN
  Administered 2016-03-26: 3 mL

## 2016-03-26 MED ORDER — ZOLPIDEM TARTRATE 5 MG PO TABS
5.0000 mg | ORAL_TABLET | Freq: Once | ORAL | Status: AC
  Administered 2016-03-26: 5 mg via ORAL

## 2016-03-26 MED ORDER — DIPHENHYDRAMINE HCL 25 MG PO CAPS: 25.0000 mg | ORAL_CAPSULE | Freq: Four times a day (QID) | ORAL | Status: DC | PRN

## 2016-03-26 MED ORDER — MISOPROSTOL 200 MCG PO TABS
ORAL_TABLET | ORAL | Status: AC
  Filled 2016-03-26: qty 4

## 2016-03-26 MED ORDER — AMMONIA AROMATIC IN INHA
RESPIRATORY_TRACT | Status: AC
  Filled 2016-03-26: qty 10

## 2016-03-26 MED ORDER — FENTANYL CITRATE (PF) 100 MCG/2ML IJ SOLN: 50.0000 ug | INTRAMUSCULAR | Status: DC | PRN

## 2016-03-26 MED ORDER — ONDANSETRON HCL 4 MG PO TABS: 4.0000 mg | ORAL_TABLET | ORAL | Status: DC | PRN

## 2016-03-26 MED ORDER — SIMETHICONE 80 MG PO CHEW: 80.0000 mg | CHEWABLE_TABLET | ORAL | Status: DC | PRN

## 2016-03-26 MED ORDER — LIDOCAINE-EPINEPHRINE (PF) 1.5 %-1:200000 IJ SOLN
INTRAMUSCULAR | Status: DC | PRN
  Administered 2016-03-26: 3 mL via EPIDURAL

## 2016-03-26 MED ORDER — ZOLPIDEM TARTRATE 5 MG PO TABS
ORAL_TABLET | ORAL | Status: AC
  Administered 2016-03-26: 5 mg via ORAL
  Filled 2016-03-26: qty 1

## 2016-03-26 MED ORDER — SENNOSIDES-DOCUSATE SODIUM 8.6-50 MG PO TABS
2.0000 | ORAL_TABLET | ORAL | Status: DC
  Administered 2016-03-26 – 2016-03-27 (×2): 2 via ORAL
  Filled 2016-03-26 (×2): qty 2

## 2016-03-26 MED ORDER — WITCH HAZEL-GLYCERIN EX PADS: 1.0000 "application " | MEDICATED_PAD | CUTANEOUS | Status: DC | PRN

## 2016-03-26 MED ORDER — PHENYLEPHRINE 40 MCG/ML (10ML) SYRINGE FOR IV PUSH (FOR BLOOD PRESSURE SUPPORT)
80.0000 ug | PREFILLED_SYRINGE | INTRAVENOUS | Status: DC | PRN
  Filled 2016-03-26: qty 5

## 2016-03-26 MED ORDER — OXYTOCIN 10 UNIT/ML IJ SOLN
INTRAMUSCULAR | Status: AC
  Filled 2016-03-26: qty 2

## 2016-03-26 MED ORDER — PRENATAL MULTIVITAMIN CH
1.0000 | ORAL_TABLET | Freq: Every day | ORAL | Status: DC
  Administered 2016-03-27: 1 via ORAL
  Filled 2016-03-26: qty 1

## 2016-03-26 MED ORDER — LACTATED RINGERS IV SOLN
500.0000 mL | Freq: Once | INTRAVENOUS | Status: AC
  Administered 2016-03-26: 500 mL via INTRAVENOUS

## 2016-03-26 MED ORDER — OXYCODONE-ACETAMINOPHEN 5-325 MG PO TABS: 1.0000 | ORAL_TABLET | ORAL | Status: DC | PRN

## 2016-03-26 MED ORDER — ONDANSETRON HCL 4 MG/2ML IJ SOLN: 4.0000 mg | INTRAMUSCULAR | Status: DC | PRN

## 2016-03-26 MED ORDER — OXYCODONE-ACETAMINOPHEN 5-325 MG PO TABS: 2.0000 | ORAL_TABLET | ORAL | Status: DC | PRN

## 2016-03-26 MED ORDER — BENZOCAINE-MENTHOL 20-0.5 % EX AERO
1.0000 "application " | INHALATION_SPRAY | CUTANEOUS | Status: DC | PRN
  Administered 2016-03-26: 1 via TOPICAL
  Filled 2016-03-26: qty 56

## 2016-03-26 NOTE — Discharge Summary (Signed)
Obstetric Discharge Summary Reason for Admission: Induction of labor gestational diabetes Prenatal Procedures: none Intrapartum Procedures: forceps assisted vaginal delivery Postpartum Procedures: none Complications-Operative and Postpartum: 1st degree perineal laceration HEMOGLOBIN  Date Value Ref Range Status  03/27/2016 10.3* 12.0 - 16.0 g/dL Final   HGB  Date Value Ref Range Status  04/08/2014 12.6 12.0-16.0 g/dL Final   HCT  Date Value Ref Range Status  03/27/2016 30.2* 35.0 - 47.0 % Final  04/08/2014 36.6 35.0-47.0 % Final    Physical Exam:  General: alert and appears stated age 25: appropriate Uterine Fundus: firm DVT Evaluation: No evidence of DVT seen on physical exam.  Discharge Diagnoses: Term Pregnancy-delivered  Discharge Information: Date: 03/28/2016 Activity: pelvic rest Diet: routine   Medication List    STOP taking these medications        ondansetron 8 MG tablet  Commonly known as:  ZOFRAN        Condition: stable Discharge to: home Follow-up Information    Follow up with Dorthula Nettles, MD. Schedule an appointment as soon as possible for a visit in 6 weeks.   Specialty:  Obstetrics and Gynecology   Why:  postpartum visit   Contact information:   [address] [place] [address] [phone]      Rubella Immune/Varicella Immune/TDAP UTD Breastfeeding/Condoms  Newborn Data: Live born female  Birth Weight: 6 lb 15.5 oz (3160 g) APGAR: 7, 9 Breastfeeding  Home with mother.   Rod Can, CNM

## 2016-03-26 NOTE — Anesthesia Procedure Notes (Signed)
Epidural Patient location during procedure: OB Start time: 03/26/2016 8:40 AM End time: 03/26/2016 8:46 AM  Staffing Resident/CRNA: Doreen Salvage Performed by: resident/CRNA   Preanesthetic Checklist Completed: patient identified, site marked, surgical consent, pre-op evaluation, timeout performed, IV checked, risks and benefits discussed and monitors and equipment checked  Epidural Patient position: sitting Prep: Betadine Patient monitoring: heart rate, continuous pulse ox and blood pressure Approach: midline Location: L4-L5 Injection technique: LOR saline  Needle:  Needle type: Tuohy  Needle gauge: 17 G Needle length: 9 cm and 9 Needle insertion depth: 7 cm Catheter type: closed end flexible Catheter size: 19 Gauge Catheter at skin depth: 12 cm Test dose: negative and 1.5% lidocaine with Epi 1:200 K  Assessment Sensory level: T8 Events: blood not aspirated, injection not painful, no injection resistance, negative IV test and no paresthesia  Additional Notes   Patient tolerated the insertion well without immediate complications.Reason for block:procedure for pain

## 2016-03-26 NOTE — Progress Notes (Signed)
L&D Progress Note  S: Nurse reports SROM-clear fluid. Good relief of pain with epidural  O: BP 119/74 mmHg  Pulse 94  Temp(Src) 98.5 F (36.9 C) (Oral)  Resp 20  Ht 5\' 2"  (1.575 m)  Wt 94.802 kg (209 lb)  BMI 38.22 kg/m2  LMP 05/28/2015 (LMP Unknown) FSBS=71 General: in NAD FHR: 140 with acceleration to 150s with scalp stim, had 2 late decelerations while on back for foley insertion and exam Cervix: 3/70%/-1/ anterior. AROM forebag-small amt pink tinged fluid Toco: contractions q1-2 min apart, IUPC inserted Foley inserted: urine clear light yellow  A/P: Monitor contractions-start Pitocin if not adequate Monitor FWB closely  Patricia Henson, CNM

## 2016-03-26 NOTE — Lactation Note (Signed)
This note was copied from a baby's chart. Lactation Consultation Note  Patient Name: Patricia Henson [id number] Date: 03/26/2016 Reason for consult: Initial assessment   Maternal Data   Mom gest diabetic, diet controlled. Baby asymptomatic, but had a blood sugar of 38, so RN called me in to assess feed. Baby had already been nursing "a while" before I arrived, but I did see at least 8 minutes of nutritive nursing. Mom nursed her 24 year old for more than 3 months without a problem (other than "a lot of milk").  Feeding Feeding Type: Breast Fed If blood sugar remains out of therapeutic level, mom can hand express and feed baby colostrum; if that doesn't work, approx 10 ml formula by spoon, syringe or slow flow bottle (last choice). Adjust volume as needed.   LATCH Score/Interventions Latch: Grasps breast easily, tongue down, lips flanged, rhythmical sucking.  Audible Swallowing: A few with stimulation Intervention(s): Hand expression  Type of Nipple: Everted at rest and after stimulation  Comfort (Breast/Nipple): Soft / non-tender     Hold (Positioning): Assistance needed to correctly position infant at breast and maintain latch. Intervention(s): Support Pillows  LATCH Score: 8  Lactation Tools Discussed/Used     Consult Status Consult Status: Follow-up Date: 03/27/16    Roque Cash 03/26/2016, 5:07 PM

## 2016-03-26 NOTE — Progress Notes (Signed)
Subjective:  Comfortable with epidural in place  Objective:   Vitals: Blood pressure 119/74, pulse 94, temperature 98.5 F (36.9 C), temperature source Oral, resp. rate 20, height 5\' 2"  (1.575 m), weight 94.802 kg (209 lb), last menstrual period 05/28/2015. General: NAD Abdomen: Gravid Cervical Exam:  Dilation: 3 Effacement (%): 80 Cervical Position: Middle Station: -1, -2 Exam by:: clg  FHT: 145, minimal to moderate, no accels, no decels Toco: q3-63min  Results for orders placed or performed during the hospital encounter of 03/25/16 (from the past 24 hour(s))  Glucose, capillary     Status: None   Collection Time: 03/25/16  9:43 PM  Result Value Ref Range   Glucose-Capillary 99 65 - 99 mg/dL  CBC     Status: Abnormal   Collection Time: 03/25/16  9:49 PM  Result Value Ref Range   WBC 14.2 (H) 3.6 - 11.0 K/uL   RBC 3.92 3.80 - 5.20 MIL/uL   Hemoglobin 10.5 (L) 12.0 - 16.0 g/dL   HCT 30.2 (L) 35.0 - 47.0 %   MCV 77.1 (L) 80.0 - 100.0 fL   MCH 26.8 26.0 - 34.0 pg   MCHC 34.8 32.0 - 36.0 g/dL   RDW 14.8 (H) 11.5 - 14.5 %   Platelets 224 150 - 440 K/uL  Type and screen [facility]     Status: None   Collection Time: 03/25/16  9:49 PM  Result Value Ref Range   ABO/RH(D) A POS    Antibody Screen NEG    Sample Expiration 03/28/2016   Comprehensive metabolic panel     Status: Abnormal   Collection Time: 03/25/16  9:49 PM  Result Value Ref Range   Sodium 136 135 - 145 mmol/L   Potassium 3.7 3.5 - 5.1 mmol/L   Chloride 105 101 - 111 mmol/L   CO2 21 (L) 22 - 32 mmol/L   Glucose, Bld 110 (H) 65 - 99 mg/dL   BUN 8 6 - 20 mg/dL   Creatinine, Ser 0.49 0.44 - 1.00 mg/dL   Calcium 9.1 8.9 - 10.3 mg/dL   Total Protein 6.7 6.5 - 8.1 g/dL   Albumin 3.0 (L) 3.5 - 5.0 g/dL   AST 19 15 - 41 U/L   ALT 10 (L) 14 - 54 U/L   Alkaline Phosphatase 84 38 - 126 U/L   Total Bilirubin 0.1 (L) 0.3 - 1.2 mg/dL   GFR calc non Af Amer >60 >60 mL/min   GFR calc Af Amer >60  >60 mL/min   Anion gap 10 5 - 15  Protein / creatinine ratio, urine     Status: None   Collection Time: 03/25/16 10:25 PM  Result Value Ref Range   Creatinine, Urine 132 mg/dL   Total Protein, Urine 8 mg/dL   Protein Creatinine Ratio 0.06 0.00 - 0.15 mg/mg[Cre]  Urine Drug Screen, Qualitative ([facility] only)     Status: None   Collection Time: 03/25/16 10:25 PM  Result Value Ref Range   Tricyclic, Ur Screen NONE DETECTED NONE DETECTED   Amphetamines, Ur Screen NONE DETECTED NONE DETECTED   MDMA (Ecstasy)Ur Screen NONE DETECTED NONE DETECTED   Cocaine Metabolite,Ur [place] NONE DETECTED NONE DETECTED   Opiate, Ur Screen NONE DETECTED NONE DETECTED   Phencyclidine (PCP) Ur S NONE DETECTED NONE DETECTED   Cannabinoid 50 Ng, Ur [place] NONE DETECTED NONE DETECTED   Barbiturates, Ur Screen NONE DETECTED NONE DETECTED   Benzodiazepine, Ur Scrn NONE DETECTED NONE DETECTED   Methadone Scn, Ur  NONE DETECTED NONE DETECTED  Glucose, capillary     Status: None   Collection Time: 03/26/16  1:04 AM  Result Value Ref Range   Glucose-Capillary 78 65 - 99 mg/dL  Glucose, capillary     Status: None   Collection Time: 03/26/16  5:50 AM  Result Value Ref Range   Glucose-Capillary 88 65 - 99 mg/dL    Assessment:   25 y.o. G2P1001 [redacted]w[redacted]d   Plan:   1) Labor - switch to pitocin, monitor for cervical change.  Will plan on AROM once 4cm or greater  2) Fetus - cat I  3) GDM - BG running normal

## 2016-03-26 NOTE — Progress Notes (Signed)
L&D Progress Note  25 year old G2 P1001 now 39.1 weeks was admitted last night for IOL due to GDM (presumed) and concerns for FGR (small AC)   S: Wants an epidural. Has received 3 doses of Fentanyl overnight  O: BP 128/67 mmHg  Pulse 102  Temp(Src) 98.5 F (36.9 C) (Oral)  Resp 20  Ht 5\' 2"  (1.575 m)  Wt 94.802 kg (209 lb)  BMI 38.22 kg/m2  LMP 05/28/2015 (LMP Unknown)  FSBSs have been normal: range 78-99  FHR 145 with moderate variability  Toco: frequent contractions-q1 minute  Cervix: Cervidil removed. 3cm/75%/-1 to -2  A/P: IUP at 39.1 weeks with GDM  Normal blood sugars in labor  Progressing, but hyperstimulation on Cervidil-which was removed. Can augment with pitocin if needed. Will monitor contraction pattern closely  FWB- Cat 1   Epidural for pain control  Patricia Henson, CNM

## 2016-03-26 NOTE — Anesthesia Preprocedure Evaluation (Addendum)
Anesthesia Evaluation  Patient identified by MRN, date of birth, ID band Patient awake    Reviewed: Allergy & Precautions, H&P , NPO status , Patient's Chart, lab work & pertinent test results, reviewed documented beta blocker date and time   History of Anesthesia Complications Negative for: history of anesthetic complications  Airway Mallampati: II  TM Distance: >3 FB     Dental no notable dental hx. (+) Chipped   Pulmonary neg pulmonary ROS,    Pulmonary exam normal        Cardiovascular negative cardio ROS Normal cardiovascular exam     Neuro/Psych Anxiety negative neurological ROS  negative psych ROS   GI/Hepatic negative GI ROS, Neg liver ROS,   Endo/Other  diabetes, Type 2  Renal/GU negative Renal ROS  negative genitourinary   Musculoskeletal   Abdominal   Peds  Hematology negative hematology ROS (+)   Anesthesia Other Findings   Reproductive/Obstetrics (+) Pregnancy                            Anesthesia Physical Anesthesia Plan  ASA: II  Anesthesia Plan: Epidural   Post-op Pain Management:    Induction:   Airway Management Planned:   Additional Equipment:   Intra-op Plan:   Post-operative Plan:   Informed Consent: I have reviewed the patients History and Physical, chart, labs and discussed the procedure including the risks, benefits and alternatives for the proposed anesthesia with the patient or authorized representative who has indicated his/her understanding and acceptance.     Plan Discussed with: CRNA and Anesthesiologist  Anesthesia Plan Comments:        Anesthesia Quick Evaluation

## 2016-03-27 LAB — CBC
HCT: 30.2 % — ABNORMAL LOW (ref 35.0–47.0)
Hemoglobin: 10.3 g/dL — ABNORMAL LOW (ref 12.0–16.0)
MCH: 26.4 pg (ref 26.0–34.0)
MCHC: 34 g/dL (ref 32.0–36.0)
MCV: 77.4 fL — ABNORMAL LOW (ref 80.0–100.0)
PLATELETS: 207 10*3/uL (ref 150–440)
RBC: 3.9 MIL/uL (ref 3.80–5.20)
RDW: 14.2 % (ref 11.5–14.5)
WBC: 18.5 10*3/uL — AB (ref 3.6–11.0)

## 2016-03-27 LAB — RPR: RPR: NONREACTIVE

## 2016-03-27 NOTE — Anesthesia Post-op Follow-up Note (Signed)
  Anesthesia Pain Follow-up Note  Patient: Patricia Henson  Day #: 1  Date of Follow-up: 03/27/2016 Time: 7:08 AM  Last Vitals:  Filed Vitals:   03/27/16 0049 03/27/16 0506  BP: 122/66 120/66  Pulse: 96 100  Temp: 37 C 36.8 C  Resp: 18 18    Level of Consciousness: alert  Pain: mild   Side Effects:None  Catheter Site Exam: site not evaluted  Plan: D/C from anesthesia care  Blima Singer

## 2016-03-27 NOTE — Progress Notes (Signed)
  Postpartum Day 1  Subjective: no complaints, up ad lib, voiding and tolerating PO  Objective: Blood pressure 119/77, pulse 101, temperature 98.2 F (36.8 C), temperature source Oral, resp. rate 18, height 5\' 2"  (1.575 m), weight 209 lb (94.802 kg), last menstrual period 05/28/2015, SpO2 100 %  Physical Exam:  General: alert and cooperative Lochia: appropriate Uterine Fundus: firm Incision: N/A DVT Evaluation: No evidence of DVT seen on physical exam. Abdomen: soft, NT   Recent Labs  03/25/16 2149 03/27/16 0452  HGB 10.5* 10.3*  HCT 30.2* 30.2*    Assessment PPD #1, mild iron-deficiency anemia  Plan: Discharge tomorrow and Continue PP care  Feeding: bottle Contraception: undecided Blood Type: A+ RI/VI TDAP UTD    Burlene Arnt, [place] 03/27/2016, 11:43 AM

## 2016-03-27 NOTE — Anesthesia Postprocedure Evaluation (Signed)
Anesthesia Post Note  Patient: Patricia Henson  Procedure(s) Performed: * No procedures listed *  Patient location during evaluation: [facility] Anesthesia Type: Epidural Level of consciousness: awake and alert and oriented Pain management: satisfactory to patient Vital Signs Assessment: post-procedure vital signs reviewed and stable Respiratory status: respiratory function stable Cardiovascular status: stable Postop Assessment: no backache, no headache, epidural receding, patient able to bend at knees, no signs of nausea or vomiting and adequate PO intake Anesthetic complications: no    Last Vitals:  Filed Vitals:   03/27/16 0049 03/27/16 0506  BP: 122/66 120/66  Pulse: 96 100  Temp: 37 C 36.8 C  Resp: 18 18    Last Pain:  Filed Vitals:   03/27/16 0507  PainSc: 0-No pain                 Blima Singer

## 2016-03-27 NOTE — Clinical Social Work Note (Signed)
CSW was consulted for "drug exposed newborn." Patient's nurse nor CSW were able to find a current drug history and patient's only urine drug screens through the hospital have both been negative. CSW attempted earlier this morning to see patient but she was sleeping and then CSW just returned to see patient but the birth certificate representative was with her. If time permits, CSW will attempt to return to see patient. Currently no concerns or issues have been found. Shela Leff MSW,LCSW

## 2016-03-28 NOTE — Progress Notes (Signed)
Discharged to home to car via auxillary 

## 2016-03-28 NOTE — Clinical Social Work Maternal (Signed)
  CLINICAL SOCIAL WORK MATERNAL/CHILD NOTE  Patient Details  Name: Patricia Henson MRN: [id number] Date of Birth: [birth year]  Date:  03/28/2016  Clinical Social Worker Initiating Note:  Shela Leff MSW,LCSW Date/ Time Initiated:  03/28/16/1056     Child's Name:      Legal Guardian:  Mother   Need for Interpreter:  None   Date of Referral:        Reason for Referral:   (newborn drug exposure)   Referral Source:  Physician   Address:     Phone number:      Household Members:  Self, Significant Other, Minor Children   Natural Supports (not living in the home):      Professional Supports: None   Employment:     Type of Work:     Education:      Pensions consultant:  [employer]   Other Resources:      Cultural/Religious Considerations Which May Impact Care:  none  Strengths:  Ability to meet basic needs , Compliance with medical plan , Home prepared for child    Risk Factors/Current Problems:  None   Cognitive State:  Alert    Mood/Affect:  Happy , Comfortable    CSW Assessment: CSW attempted for 3rd time to see patient and this time was able to speak with patient. Patient was sitting up in her bed tending to her newborn and father of baby was asleep in the chair. Patient expressed she has all necessities for her newborn and has no concerns regarding caring for her newborn. She has another child in the home as well and family is watching that child while she is in the hospital. Patient reports no issues at this time.   CSW Plan/Description:  No Further Intervention Required/No Barriers to Discharge    Shela Leff, LCSW 03/28/2016, 10:57 AM

## 2016-03-28 NOTE — Progress Notes (Signed)
Discharge instructions reviewed with pt.  Verb u/o.

## 2016-06-28 ENCOUNTER — Encounter: Payer: Self-pay | Admitting: Emergency Medicine

## 2016-06-28 DIAGNOSIS — Z5321 Procedure and treatment not carried out due to patient leaving prior to being seen by health care provider: Secondary | ICD-10-CM | POA: Insufficient documentation

## 2016-06-28 DIAGNOSIS — O200 Threatened abortion: Secondary | ICD-10-CM | POA: Diagnosis not present

## 2016-06-28 DIAGNOSIS — R102 Pelvic and perineal pain: Secondary | ICD-10-CM | POA: Diagnosis present

## 2016-06-28 DIAGNOSIS — R1030 Lower abdominal pain, unspecified: Secondary | ICD-10-CM

## 2016-06-28 LAB — POCT PREGNANCY, URINE: Preg Test, Ur: POSITIVE — AB

## 2016-06-28 MED ORDER — ONDANSETRON 4 MG PO TBDP: 4.0000 mg | ORAL_TABLET | Freq: Once | ORAL | Status: DC | PRN

## 2016-06-28 NOTE — ED Triage Notes (Signed)
Pt states lower abd pain that began this am with vomiting and headache. Pt states is pregnant but unsure of how many weeks. Pt appears in no acute distress in triage, pt denies known fever, chills or diarrhea.

## 2016-06-28 NOTE — ED Triage Notes (Signed)
Discussed pain management with pt in triage. Pt states she took tylenol pta.

## 2016-06-29 ENCOUNTER — Emergency Department
Admission: EM | Admit: 2016-06-29 | Discharge: 2016-06-29 | Disposition: A | Discharge status: Home / Self Care | Payer: Medicaid Other | Source: Home / Self Care

## 2016-06-29 ENCOUNTER — Emergency Department
Admission: EM | Admit: 2016-06-29 | Discharge: 2016-06-29 | Disposition: A | Discharge status: Home / Self Care | Payer: Medicaid Other | Attending: Emergency Medicine | Admitting: Emergency Medicine

## 2016-06-29 ENCOUNTER — Encounter: Payer: Self-pay | Admitting: Emergency Medicine

## 2016-06-29 ENCOUNTER — Emergency Department: Payer: Medicaid Other

## 2016-06-29 DIAGNOSIS — R102 Pelvic and perineal pain: Secondary | ICD-10-CM

## 2016-06-29 DIAGNOSIS — O26899 Other specified pregnancy related conditions, unspecified trimester: Secondary | ICD-10-CM

## 2016-06-29 DIAGNOSIS — O200 Threatened abortion: Secondary | ICD-10-CM | POA: Insufficient documentation

## 2016-06-29 LAB — URINALYSIS COMPLETE WITH MICROSCOPIC (ARMC ONLY)
Bilirubin Urine: NEGATIVE
Glucose, UA: NEGATIVE mg/dL
HGB URINE DIPSTICK: NEGATIVE
Leukocytes, UA: NEGATIVE
NITRITE: NEGATIVE
PROTEIN: 30 mg/dL — AB
SPECIFIC GRAVITY, URINE: 1.032 — AB (ref 1.005–1.030)
pH: 6 (ref 5.0–8.0)

## 2016-06-29 LAB — COMPREHENSIVE METABOLIC PANEL
ALBUMIN: 4.2 g/dL (ref 3.5–5.0)
ALT: 22 U/L (ref 14–54)
ANION GAP: 7 (ref 5–15)
AST: 20 U/L (ref 15–41)
Alkaline Phosphatase: 47 U/L (ref 38–126)
BILIRUBIN TOTAL: 0.4 mg/dL (ref 0.3–1.2)
BUN: 14 mg/dL (ref 6–20)
CHLORIDE: 104 mmol/L (ref 101–111)
CO2: 25 mmol/L (ref 22–32)
Calcium: 9.1 mg/dL (ref 8.9–10.3)
Creatinine, Ser: 0.66 mg/dL (ref 0.44–1.00)
GFR calc Af Amer: >60 mL/min (ref >60)
GFR calc non Af Amer: >60 mL/min (ref >60)
GLUCOSE: 92 mg/dL (ref 65–99)
POTASSIUM: 3.3 mmol/L — AB (ref 3.5–5.1)
SODIUM: 136 mmol/L (ref 135–145)
TOTAL PROTEIN: 7.1 g/dL (ref 6.5–8.1)

## 2016-06-29 LAB — CBC
HEMATOCRIT: 33.5 % — AB (ref 35.0–47.0)
HEMOGLOBIN: 11.8 g/dL — AB (ref 12.0–16.0)
MCH: 27.3 pg (ref 26.0–34.0)
MCHC: 35.1 g/dL (ref 32.0–36.0)
MCV: 77.7 fL — ABNORMAL LOW (ref 80.0–100.0)
Platelets: 255 10*3/uL (ref 150–440)
RBC: 4.31 MIL/uL (ref 3.80–5.20)
RDW: 16.2 % — ABNORMAL HIGH (ref 11.5–14.5)
WBC: 12.7 10*3/uL — ABNORMAL HIGH (ref 3.6–11.0)

## 2016-06-29 LAB — HCG, QUANTITATIVE, PREGNANCY: HCG, BETA CHAIN, QUANT, S: 89256 m[IU]/mL — AB (ref <5)

## 2016-06-29 MED ORDER — ACETAMINOPHEN 500 MG PO TABS
1000.0000 mg | ORAL_TABLET | Freq: Once | ORAL | Status: AC
  Administered 2016-06-29: 1000 mg via ORAL

## 2016-06-29 MED ORDER — ACETAMINOPHEN 500 MG PO TABS
ORAL_TABLET | ORAL | Status: AC
  Filled 2016-06-29: qty 2

## 2016-06-29 NOTE — ED Provider Notes (Signed)
[facility] Emergency Department Provider Note    ____________________________________________   I have reviewed the triage vital signs and the nursing notes.   HISTORY  Chief Complaint Abdominal Pain   History limited by: Not Limited   HPI Patricia Henson is a 25 y.o. female who presents to the emergency department today because of concerns for lower abdominal pain and vaginal bleeding. The lower abdominal pain started 2 days ago. It is cramping in nature. It is severe. Has been gradually getting worse. It is accompanied by some vaginal bleeding. Last bleeding yesterday. Patient states it was heavy. She was not aware that she was pregnant. She was in the emergency department last night and was triaged however had to leave because of her children. She states she does have a 59-month-old. She is not currently breast-feeding.   Past Medical History:  Diagnosis Date  . Anxiety   . Gestational diabetes    diet controlled    Patient Active Problem List   Diagnosis Date Noted  . Postpartum care following vaginal delivery 03/28/2016  . Fetal growth restriction 03/25/2016  . Nausea and vomiting during pregnancy 03/20/2016  . Pregnancy 03/18/2016  . Irregular uterine contractions 03/18/2016  . Vaginal discharge during pregnancy in third trimester 03/14/2016  . Labor and delivery, indication for care 03/08/2016  . Bloody show and cramping in early pregnancy 02/12/2016    Past Surgical History:  Procedure Laterality Date  . CHOLECYSTECTOMY      Prior to Admission medications   Not on File    Allergies Penicillins  Family History  Problem Relation Age of Onset  . Diabetes Mother     Social History Social History  Substance Use Topics  . Smoking status: Never Smoker  . Smokeless tobacco: Never Used  . Alcohol use No    Review of Systems  Constitutional: Negative for fever. Cardiovascular: Negative for chest pain. Respiratory: Negative  for shortness of breath. Gastrointestinal: Negative for abdominal pain, vomiting and diarrhea. Neurological: Negative for headaches, focal weakness or numbness.  10-point ROS otherwise negative.  ____________________________________________   PHYSICAL EXAM:  VITAL SIGNS: ED Triage Vitals  Enc Vitals Group     BP 06/29/16 0921 126/79     Pulse Rate 06/29/16 0921 70     Resp 06/29/16 0921 18     Temp 06/29/16 0921 98.2 F (36.8 C)     Temp src --      SpO2 06/29/16 0921 100 %     Weight 06/29/16 0921 198 lb (89.8 kg)     Height 06/29/16 0921 5\' 2"  (1.575 m)     Head Circumference --      Peak Flow --      Pain Score 06/29/16 0922 10   Constitutional: Alert and oriented. Well appearing and in no distress. Eyes: Conjunctivae are normal. PERRL. Normal extraocular movements. ENT   Head: Normocephalic and atraumatic.   Nose: No congestion/rhinnorhea.   Mouth/Throat: Mucous membranes are moist.   Neck: No stridor. Hematological/Lymphatic/Immunilogical: No cervical lymphadenopathy. Cardiovascular: Normal rate, regular rhythm.  No murmurs, rubs, or gallops. Respiratory: Normal respiratory effort without tachypnea nor retractions. Breath sounds are clear and equal bilaterally. No wheezes/rales/rhonchi. Gastrointestinal: Soft and nontender. No distention. There is no CVA tenderness. Genitourinary: Deferred Musculoskeletal: Normal range of motion in all extremities. No joint effusions.  No lower extremity tenderness nor edema. Neurologic:  Normal speech and language. No gross focal neurologic deficits are appreciated.  Skin:  Skin is warm, dry and intact.  No rash noted. Psychiatric: Mood and affect are normal. Speech and behavior are normal. Patient exhibits appropriate insight and judgment.  ____________________________________________    LABS (pertinent  positives/negatives)  None  ____________________________________________   EKG  None  ____________________________________________    RADIOLOGY  US IMPRESSION: 1. Single live IUP. Small subchorionic hemorrhage.  ____________________________________________   PROCEDURES  Procedures  ____________________________________________   INITIAL IMPRESSION / ASSESSMENT AND PLAN / ED COURSE  Pertinent labs & imaging results that were available during my care of the patient were reviewed by me and considered in my medical decision making (see chart for details).  Patient presents to the emergency department today because of concerns for pelvic pain and vaginal bleeding. Did have lab work done last night. Patient is pregnant. Will plan on obtaining ultrasound. Previous Henson work shows patient is Rh+.  Clinical Course   Ultrasound shows an intrauterine gestational sac. Discussed threatened abortion with the patient. Will discharge to follow up with OB/GYN. ____________________________________________   FINAL CLINICAL IMPRESSION(S) / ED DIAGNOSES  Final diagnoses:  None     Note: This dictation was prepared with Dragon dictation. Any transcriptional errors that result from this process are unintentional    Nance Pear, MD 06/29/16 [phone]

## 2016-06-29 NOTE — ED Notes (Signed)
No answer when called for treatment room.  ?

## 2016-06-29 NOTE — ED Notes (Addendum)
This RN notified that patient had left prior to being D/C. Per [place], pt no longer in room. Unable to get last set of vitals at this time. Pt left after MD went over D/C instructions. Will leave AVS with secretary in event pt returns for AVS D/C instruction.

## 2016-06-29 NOTE — ED Notes (Signed)
Delay explained to patient, pt's family at bedside at this time. Pt c/o pain in her lower abdomen, explained will notify MD at this time. Pt states understanding. Will continue to monitor.

## 2016-06-29 NOTE — ED Notes (Signed)
Pt and family sitting in room at this time. NAD noted. Explained continued wait for [place] results at this time. Pt states understanding.

## 2016-06-29 NOTE — ED Notes (Signed)
Pt in US at this time 

## 2016-06-29 NOTE — ED Notes (Signed)
Pt returned from US at this time.

## 2016-06-29 NOTE — Discharge Instructions (Signed)
Please seek medical attention for any high fevers, chest pain, shortness of breath, change in behavior, persistent vomiting, bloody stool or any other new or concerning symptoms.  

## 2016-06-29 NOTE — ED Triage Notes (Signed)
Lower abdominal cramping and vag bleed since yesterday. No vag bleed today.

## 2016-06-29 NOTE — ED Triage Notes (Signed)
Patient was triaged yesterday and had protocols but left without being seen.

## 2016-11-25 NOTE — L&D Delivery Note (Signed)
Estimated Date of Delivery: 02/10/17 EGA: [redacted]w[redacted]d  Delivery Note At 12:18 AM a viable female was delivered via Vaginal, Spontaneous Delivery (Presentation: OA; ROA).  APGAR: 8, 9; weight 6 lb 13.4 oz (3100 g).   Placenta status: spontaneous, intact.  Cord:  with the following complications: none.  Cord pH: NA  Called to see patient.  Mom pushed to deliver a viable female infant.  The head followed by shoulders, which delivered without difficulty, and the rest of the body.  No nuchal cord noted.  Baby to mom's chest.  Cord clamped and cut after > 1 min delay.  No cord blood obtained.  Placenta delivered spontaneously, intact, with a 3-vessel cord.  Perineum intact, no lacerations.  All counts correct.  Hemostasis obtained with IV pitocin and fundal massage.   Anesthesia:  epidural Episiotomy: None Lacerations: None Suture Repair: NA Est. Blood Loss (mL): 250  Mom to postpartum.  Baby to Couplet care / Skin to Skin.  Rod Can, CNM 01/21/2017, 12:29 AM

## 2017-01-19 ENCOUNTER — Inpatient Hospital Stay
Admission: EM | Admit: 2017-01-19 | Discharge: 2017-01-19 | DRG: 775 | Disposition: A | Discharge status: Home / Self Care | Payer: Medicaid Other | Attending: Obstetrics and Gynecology | Admitting: Obstetrics and Gynecology

## 2017-01-19 ENCOUNTER — Inpatient Hospital Stay
Admission: EM | Admit: 2017-01-19 | Discharge: 2017-01-23 | Disposition: A | Discharge status: Home / Self Care | Payer: Medicaid Other | Source: Home / Self Care | Attending: Advanced Practice Midwife | Admitting: Advanced Practice Midwife

## 2017-01-19 ENCOUNTER — Encounter: Payer: Self-pay | Admitting: *Deleted

## 2017-01-19 DIAGNOSIS — E669 Obesity, unspecified: Secondary | ICD-10-CM | POA: Diagnosis present

## 2017-01-19 DIAGNOSIS — R51 Headache: Secondary | ICD-10-CM | POA: Diagnosis present

## 2017-01-19 DIAGNOSIS — Z6841 Body Mass Index (BMI) 40.0 and over, adult: Secondary | ICD-10-CM

## 2017-01-19 DIAGNOSIS — O99214 Obesity complicating childbirth: Secondary | ICD-10-CM | POA: Diagnosis present

## 2017-01-19 DIAGNOSIS — O9902 Anemia complicating childbirth: Secondary | ICD-10-CM | POA: Diagnosis present

## 2017-01-19 DIAGNOSIS — Z880 Allergy status to penicillin: Secondary | ICD-10-CM

## 2017-01-19 DIAGNOSIS — D509 Iron deficiency anemia, unspecified: Secondary | ICD-10-CM | POA: Diagnosis present

## 2017-01-19 DIAGNOSIS — O1493 Unspecified pre-eclampsia, third trimester: Secondary | ICD-10-CM

## 2017-01-19 DIAGNOSIS — Z23 Encounter for immunization: Secondary | ICD-10-CM | POA: Diagnosis not present

## 2017-01-19 DIAGNOSIS — Z3A36 36 weeks gestation of pregnancy: Secondary | ICD-10-CM | POA: Diagnosis not present

## 2017-01-19 DIAGNOSIS — O134 Gestational [pregnancy-induced] hypertension without significant proteinuria, complicating childbirth: Secondary | ICD-10-CM | POA: Diagnosis present

## 2017-01-19 DIAGNOSIS — O139 Gestational [pregnancy-induced] hypertension without significant proteinuria, unspecified trimester: Secondary | ICD-10-CM | POA: Diagnosis present

## 2017-01-19 LAB — PROTEIN / CREATININE RATIO, URINE
Creatinine, Urine: 199 mg/dL
Protein Creatinine Ratio: 0.09 mg/mg{Cre} (ref 0.00–0.15)
TOTAL PROTEIN, URINE: 18 mg/dL

## 2017-01-19 LAB — COMPREHENSIVE METABOLIC PANEL
ALBUMIN: 2.8 g/dL — AB (ref 3.5–5.0)
ALK PHOS: 83 U/L (ref 38–126)
ALT: 11 U/L — AB (ref 14–54)
AST: 22 U/L (ref 15–41)
Anion gap: 7 (ref 5–15)
BUN: 8 mg/dL (ref 6–20)
CALCIUM: 8.7 mg/dL — AB (ref 8.9–10.3)
CHLORIDE: 107 mmol/L (ref 101–111)
CO2: 21 mmol/L — AB (ref 22–32)
CREATININE: 0.36 mg/dL — AB (ref 0.44–1.00)
GFR calc non Af Amer: >60 mL/min (ref >60)
GLUCOSE: 92 mg/dL (ref 65–99)
Potassium: 3.8 mmol/L (ref 3.5–5.1)
SODIUM: 135 mmol/L (ref 135–145)
Total Bilirubin: 0.4 mg/dL (ref 0.3–1.2)
Total Protein: 6.5 g/dL (ref 6.5–8.1)

## 2017-01-19 LAB — CBC
HCT: 30.9 % — ABNORMAL LOW (ref 35.0–47.0)
HEMOGLOBIN: 11.1 g/dL — AB (ref 12.0–16.0)
MCH: 27.3 pg (ref 26.0–34.0)
MCHC: 36 g/dL (ref 32.0–36.0)
MCV: 75.6 fL — ABNORMAL LOW (ref 80.0–100.0)
PLATELETS: 214 10*3/uL (ref 150–440)
RBC: 4.09 MIL/uL (ref 3.80–5.20)
RDW: 15.6 % — ABNORMAL HIGH (ref 11.5–14.5)
WBC: 13.6 10*3/uL — AB (ref 3.6–11.0)

## 2017-01-19 LAB — TYPE AND SCREEN
ABO/RH(D): A POS
ABO/RH(D): A POS
ANTIBODY SCREEN: NEGATIVE
Antibody Screen: NEGATIVE

## 2017-01-19 MED ORDER — LACTATED RINGERS IV SOLN
INTRAVENOUS | Status: DC
  Administered 2017-01-19 – 2017-01-20 (×3): via INTRAVENOUS

## 2017-01-19 MED ORDER — LIDOCAINE HCL (PF) 1 % IJ SOLN: 30.0000 mL | INTRAMUSCULAR | Status: DC | PRN

## 2017-01-19 MED ORDER — OXYTOCIN BOLUS FROM INFUSION
500.0000 mL | Freq: Once | INTRAVENOUS | Status: DC
Start: 2017-01-19 — End: 2017-01-21

## 2017-01-19 MED ORDER — LACTATED RINGERS IV SOLN: 500.0000 mL | INTRAVENOUS | Status: DC | PRN

## 2017-01-19 MED ORDER — OXYTOCIN 10 UNIT/ML IJ SOLN: 10.0000 [IU] | Freq: Once | INTRAMUSCULAR | Status: DC

## 2017-01-19 MED ORDER — ACETAMINOPHEN 500 MG PO TABS
1000.0000 mg | ORAL_TABLET | Freq: Once | ORAL | Status: AC
  Administered 2017-01-19: 1000 mg via ORAL
  Filled 2017-01-19: qty 2

## 2017-01-19 MED ORDER — LIDOCAINE HCL (PF) 1 % IJ SOLN
INTRAMUSCULAR | Status: AC
  Filled 2017-01-19: qty 30

## 2017-01-19 MED ORDER — DINOPROSTONE 10 MG VA INST
10.0000 mg | VAGINAL_INSERT | Freq: Once | VAGINAL | Status: AC
  Administered 2017-01-19: 10 mg via VAGINAL
  Filled 2017-01-19: qty 1

## 2017-01-19 MED ORDER — ZOLPIDEM TARTRATE 5 MG PO TABS
5.0000 mg | ORAL_TABLET | Freq: Every day | ORAL | Status: AC
  Administered 2017-01-19: 5 mg via ORAL
  Filled 2017-01-19: qty 1

## 2017-01-19 MED ORDER — OXYTOCIN 40 UNITS IN LACTATED RINGERS INFUSION - SIMPLE MED: 2.5000 [IU]/h | INTRAVENOUS | Status: DC

## 2017-01-19 MED ORDER — FAMOTIDINE 40 MG/5ML PO SUSR
40.0000 mg | Freq: Two times a day (BID) | ORAL | Status: DC
  Administered 2017-01-19 – 2017-01-20 (×2): 40 mg via ORAL
  Filled 2017-01-19 (×4): qty 5

## 2017-01-19 MED ORDER — AMMONIA AROMATIC IN INHA
RESPIRATORY_TRACT | Status: DC
Start: 2017-01-19 — End: 2017-01-21
  Filled 2017-01-19: qty 10

## 2017-01-19 MED ORDER — TERBUTALINE SULFATE 1 MG/ML IJ SOLN: 0.2500 mg | Freq: Once | INTRAMUSCULAR | Status: DC | PRN

## 2017-01-19 MED ORDER — OXYTOCIN 10 UNIT/ML IJ SOLN
INTRAMUSCULAR | Status: DC
Start: 2017-01-19 — End: 2017-01-21
  Filled 2017-01-19: qty 2

## 2017-01-19 MED ORDER — SOD CITRATE-CITRIC ACID 500-334 MG/5ML PO SOLN
30.0000 mL | ORAL | Status: DC | PRN
  Administered 2017-01-19: 30 mL via ORAL
  Filled 2017-01-19 (×2): qty 30

## 2017-01-19 MED ORDER — MISOPROSTOL 200 MCG PO TABS
ORAL_TABLET | ORAL | Status: AC
  Filled 2017-01-19: qty 4

## 2017-01-19 MED ORDER — ONDANSETRON HCL 4 MG/2ML IJ SOLN
4.0000 mg | Freq: Four times a day (QID) | INTRAMUSCULAR | Status: DC | PRN
  Administered 2017-01-20: 4 mg via INTRAVENOUS
  Filled 2017-01-19: qty 2

## 2017-01-19 NOTE — H&P (Signed)
OB History & Physical   History of Present Illness:  Chief Complaint: contractions  HPI:  Patricia Henson is a 26 y.o. G3P2000 female at [redacted]w[redacted]d dated by 12 week ultrasound.  Her pregnancy has been complicated by closely spaced pregnancies (last delivery 5/17), obesity with initial BMI 35 (20 pound weight gain this pregnancy), hgb c trait, history of marijuana use, mild anemia in third trimester.    She reports contractions.   She denies leakage of fluid.   She denies vaginal bleeding.   She reports fetal movement.    She notes a left temporal headache. She has a history of headaches in this pregnancy that have been present for the past month. She states that this headache is a bit different and severe.  She noted seeing spots upon arrival to L&D, but not currently.  She denies RUQ pain.  Maternal Medical History:       Past Medical History:  Diagnosis Date  . Anxiety   . Gestational diabetes    diet controlled    Past Surgical History:  Procedure Laterality Date  . CHOLECYSTECTOMY          Allergies  Allergen Reactions  . Penicillins Rash    Prior to Admission medications   Not on File                    OB History  Gravida Para Term Preterm AB Living  3 2 2      0  SAB TAB Ectopic Multiple Live Births        0      # Outcome Date GA Lbr Len/2nd Weight Sex Delivery Anes PTL Lv  3 Current           2 Term 03/26/16 [redacted]w[redacted]d / 00:27 6 lb 15.5 oz (3.16 kg) F Vag-Forceps EPI    1 Term               Prenatal care site: [facility]  Social History: She  reports that she has never smoked. She has never used smokeless tobacco. She reports that she uses drugs, including Marijuana. She reports that she does not drink alcohol.  Family History: family history includes Diabetes in her mother.   Review of Systems:  Constitutional: Negative for fever and chills. Eyes: Positive for visual changes upon admission. ENT: Negative  for sore throat. Cardiovascular: Negative for chest pain. Respiratory: Negative for shortness of breath and chest pain Gastrointestinal:Postive for abdominal pain, as per the HPI. Genitourinary: Negative for dysuria, vaginal bleeding. Musculoskeletal: Negative for back pain and joint pain. Skin: Negative for rash. Neurological: Positive for headache as per the HPI.     Vital Signs: BP (!) 142/67   Pulse (!) 116   Temp 98.5 F (36.9 C) (Oral)   Resp 16   Ht 5\' 2"  (1.575 m)   Wt 218 lb (98.9 kg)   LMP  (LMP Unknown)   BMI 39.87 kg/m   Constitutional: Well nourished, well developed female in no acute distress.  HEENT: normal Skin: Warm and dry.  Cardiovascular: Regular rate and rhythm.   Extremity: no edema  Respiratory: Clear to auscultation bilateral. Normal respiratory effort Abdomen: gravid, NT, EFW 7 pounds Back: no CVAT Neuro: DTRs 2+, Cranial nerves grossly intact Psych: Alert and Oriented x3. No memory deficits. Normal mood and affect.  MS: normal gait, normal bilateral lower extremity ROM/strength/stability.  Pelvic exam: 1.5cm dilated per RN  Pertinent Results:  Prenatal Labs: Blood type/Rh A positive  Antibody screen negative  Rubella Immune  Varicella Immune    RPR NR  HBsAg negative  HIV negative  GC negative  Chlamydia negative  Genetic screening declines  1 hour GTT Early 97, 28 wk 136  3 hour GTT n/a  GBS unknown   Baseline FHR: 140 beats/min   Variability: moderate   Accelerations: present   Decelerations: absent Contractions: rare Overall assessment: category 1  Recent Labs       Lab Results  Component Value Date   WBC 13.6 (H) 01/19/2017   HGB 11.1 (L) 01/19/2017   HCT 30.9 (L) 01/19/2017   PLT 214 01/19/2017   CREATININE 0.36 (L) 01/19/2017   PROTCRRATIO 0.09 01/19/2017       Assessment:  Patricia Henson is a 26 y.o. G8P2002 female at [redacted]w[redacted]d with gestational hypertension versus preeclampsia with severe features.  Discussed that with gestational hypertension would need to deliver at 37 weeks.  However, her headache is difficult to assess as compared to prior headaches.  Recommend observation for the day and start induction either tonight for gestational hypertension or earlier with magnesium prophylaxis if headache persists or any other concerning symptom develops.  Patient agrees with the plan. I have thoroughly reviewed the risks associated with severe preeclampsia, including; seizure, stroke, maternal and fetal death, with other possible complications included.  She voiced understanding.   Plan:  1. Admit to Labor & Delivery  2. CBC, T&S, Clrs, IVF 3. GBS unknown.  Will collect, but will not be back prior to initiating induction.  If would start after midnight, could use risk-based treatment and she has no risk factors.   4. Fetwal well-being: reassuring 5. GHTN vs preeclampsia with severe features: see discussion above.   Prentice Docker, MD 01/19/2017 6:17 PM

## 2017-01-19 NOTE — H&P (Signed)
OB History & Physical   History of Present Illness:  Chief Complaint: contractions  HPI:  Patricia Henson is a 26 y.o. G3P2000 female at [redacted]w[redacted]d dated by 12 week ultrasound.  Her pregnancy has been complicated by closely spaced pregnancies (last delivery 5/17), obesity with initial BMI 35 (20 pound weight gain this pregnancy), hgb c trait, history of marijuana use, mild anemia in third trimester.    She reports contractions.   She denies leakage of fluid.   She denies vaginal bleeding.   She reports fetal movement.    She notes a left temporal headache. She has a history of headaches in this pregnancy that have been present for the past month. She states that this headache is a bit different and severe.  She noted seeing spots upon arrival to L&D, but not currently.  She denies RUQ pain.  Maternal Medical History:   Past Medical History:  Diagnosis Date  . Anxiety   . Gestational diabetes    diet controlled    Past Surgical History:  Procedure Laterality Date  . CHOLECYSTECTOMY      Allergies  Allergen Reactions  . Penicillins Rash    Prior to Admission medications   Not on File    OB History  Gravida Para Term Preterm AB Living  3 2 2      0  SAB TAB Ectopic Multiple Live Births        0      # Outcome Date GA Lbr Len/2nd Weight Sex Delivery Anes PTL Lv  3 Current           2 Term 03/26/16 [redacted]w[redacted]d / 00:27 6 lb 15.5 oz (3.16 kg) F Vag-Forceps EPI    1 Term               Prenatal care site: [facility]  Social History: She  reports that she has never smoked. She has never used smokeless tobacco. She reports that she uses drugs, including Marijuana. She reports that she does not drink alcohol.  Family History: family history includes Diabetes in her mother.   Review of Systems:  Constitutional: Negative for fever and chills. Eyes: Positive for visual changes upon admission. ENT: Negative for sore throat. Cardiovascular: Negative for chest pain. Respiratory:  Negative for shortness of breath and chest pain Gastrointestinal:Postive for abdominal pain, as per the HPI. Genitourinary: Negative for dysuria, vaginal bleeding. Musculoskeletal: Negative for back pain and joint pain. Skin: Negative for rash. Neurological: Positive for headache as per the HPI.     Vital Signs: BP 128/64 (BP Location: Right Arm)   Pulse 78   Temp 98.4 F (36.9 C) (Oral)   Resp 16   Ht 5\' 2"  (1.575 m)   Wt 218 lb (98.9 kg)   LMP  (LMP Unknown)   BMI 39.87 kg/m  Constitutional: Well nourished, well developed female in no acute distress.  HEENT: normal Skin: Warm and dry.  Cardiovascular: Regular rate and rhythm.   Extremity: no edema  Respiratory: Clear to auscultation bilateral. Normal respiratory effort Abdomen: gravid, NT, EFW 7 pounds Back: no CVAT Neuro: DTRs 2+, Cranial nerves grossly intact Psych: Alert and Oriented x3. No memory deficits. Normal mood and affect.  MS: normal gait, normal bilateral lower extremity ROM/strength/stability.  Pelvic exam: 1.5cm dilated per RN  Pertinent Results:  Prenatal Labs: Blood type/Rh A positive  Antibody screen negative  Rubella Immune  Varicella Immune    RPR NR  HBsAg negative  HIV negative  GC  negative  Chlamydia negative  Genetic screening declines  1 hour GTT Early 97, 28 wk 136  3 hour GTT n/a  GBS unknown   Baseline FHR: 140 beats/min   Variability: moderate   Accelerations: present   Decelerations: absent Contractions: present frequency: irregular and infrequent Overall assessment: category 1  Lab Results  Component Value Date   WBC 13.6 (H) 01/19/2017   HGB 11.1 (L) 01/19/2017   HCT 30.9 (L) 01/19/2017   PLT 214 01/19/2017   CREATININE 0.36 (L) 01/19/2017   PROTCRRATIO 0.09 01/19/2017     Assessment:  Patricia Henson is a 26 y.o. G35P2002 female at [redacted]w[redacted]d with gestational hypertension versus preeclampsia with severe features. Discussed that with gestational hypertension would need to  deliver at 37 weeks.  However, her headache is difficult to assess as compared to prior headaches.  Recommend observation for the day and start induction either tonight for gestational hypertension or earlier with magnesium prophylaxis if headache persists or any other concerning symptom develops.  Patient agrees with the plan. I have thoroughly reviewed the risks associated with severe preeclampsia, including; seizure, stroke, maternal and fetal death, with other possible complications included.  She voiced understanding.   Plan:  1. Admit to Labor & Delivery  2. CBC, T&S, Clrs, IVF 3. GBS unknown.  Will collect, but will not be back prior to initiating induction.  If would start after midnight, could use risk-based treatment and she has no risk factors.   4. Fetwal well-being: reassuring 5. GHTN vs preeclampsia with severe features: see discussion above.   Will Bonnet, MD 01/19/2017 10:58 AM

## 2017-01-19 NOTE — Discharge Instructions (Signed)
Patient leaving AMA to take care of childcare needs.  She will return as soon as possible.  If you have any questions or concerns please call the on call provider or the nurse's desk at [facility] [phone]. Any urgent concerns please go to the nearest emergency department.

## 2017-01-19 NOTE — OB Triage Note (Signed)
Patient stated she went to the bathroom, thought she had a bowel movement. She wiped and saw blood. "I just wanted to come and get checked"

## 2017-01-20 ENCOUNTER — Inpatient Hospital Stay: Payer: Medicaid Other | Admitting: Anesthesiology

## 2017-01-20 ENCOUNTER — Encounter: Payer: Self-pay | Admitting: *Deleted

## 2017-01-20 MED ORDER — BUTORPHANOL TARTRATE 1 MG/ML IJ SOLN
1.0000 mg | Freq: Once | INTRAMUSCULAR | Status: AC
  Administered 2017-01-20: 1 mg via INTRAVENOUS
  Filled 2017-01-20: qty 1

## 2017-01-20 MED ORDER — NALBUPHINE HCL 10 MG/ML IJ SOLN: 5.0000 mg | INTRAMUSCULAR | Status: DC | PRN

## 2017-01-20 MED ORDER — DIPHENHYDRAMINE HCL 50 MG/ML IJ SOLN: 12.5000 mg | INTRAMUSCULAR | Status: DC | PRN

## 2017-01-20 MED ORDER — OXYTOCIN 40 UNITS IN LACTATED RINGERS INFUSION - SIMPLE MED
1.0000 m[IU]/min | INTRAVENOUS | Status: DC
  Administered 2017-01-20: 9 m[IU]/min via INTRAVENOUS
  Administered 2017-01-20: 3 m[IU]/min via INTRAVENOUS
  Administered 2017-01-20: 7 m[IU]/min via INTRAVENOUS
  Administered 2017-01-20: 5 m[IU]/min via INTRAVENOUS
  Administered 2017-01-20: 1 m[IU]/min via INTRAVENOUS
  Filled 2017-01-20: qty 1000

## 2017-01-20 MED ORDER — LIDOCAINE-EPINEPHRINE (PF) 1.5 %-1:200000 IJ SOLN
INTRAMUSCULAR | Status: DC | PRN
  Administered 2017-01-20: 3 mL

## 2017-01-20 MED ORDER — ACETAMINOPHEN 500 MG PO TABS
ORAL_TABLET | ORAL | Status: AC
  Administered 2017-01-20: 1000 mg via ORAL
  Filled 2017-01-20: qty 2

## 2017-01-20 MED ORDER — CLINDAMYCIN PHOSPHATE 900 MG/50ML IV SOLN
900.0000 mg | Freq: Three times a day (TID) | INTRAVENOUS | Status: DC
  Administered 2017-01-20: 900 mg via INTRAVENOUS
  Filled 2017-01-20 (×3): qty 50

## 2017-01-20 MED ORDER — NALBUPHINE HCL 10 MG/ML IJ SOLN: 5.0000 mg | Freq: Once | INTRAMUSCULAR | Status: DC | PRN

## 2017-01-20 MED ORDER — BUTORPHANOL TARTRATE 1 MG/ML IJ SOLN
INTRAMUSCULAR | Status: AC
  Administered 2017-01-20: 1 mg via INTRAVENOUS
  Filled 2017-01-20: qty 1

## 2017-01-20 MED ORDER — SODIUM CHLORIDE 0.9% FLUSH: 3.0000 mL | INTRAVENOUS | Status: DC | PRN

## 2017-01-20 MED ORDER — FENTANYL 2.5 MCG/ML W/ROPIVACAINE 0.2% IN NS 100 ML EPIDURAL INFUSION (ARMC-ANES)
10.0000 mL/h | EPIDURAL | Status: DC
  Administered 2017-01-20: 10 mL/h via EPIDURAL

## 2017-01-20 MED ORDER — DIPHENHYDRAMINE HCL 25 MG PO CAPS: 25.0000 mg | ORAL_CAPSULE | ORAL | Status: DC | PRN

## 2017-01-20 MED ORDER — NALBUPHINE HCL 10 MG/ML IJ SOLN
5.0000 mg | INTRAMUSCULAR | Status: DC | PRN
Start: 2017-01-20 — End: 2017-01-21

## 2017-01-20 MED ORDER — SODIUM CHLORIDE FLUSH 0.9 % IV SOLN
INTRAVENOUS | Status: AC
  Administered 2017-01-20: 10 mL
  Filled 2017-01-20: qty 20

## 2017-01-20 MED ORDER — NALOXONE HCL 0.4 MG/ML IJ SOLN: 0.4000 mg | INTRAMUSCULAR | Status: DC | PRN

## 2017-01-20 MED ORDER — BUTORPHANOL TARTRATE 1 MG/ML IJ SOLN
1.0000 mg | Freq: Once | INTRAMUSCULAR | Status: AC
  Administered 2017-01-20: 1 mg via INTRAVENOUS

## 2017-01-20 MED ORDER — ONDANSETRON HCL 4 MG/2ML IJ SOLN: 4.0000 mg | Freq: Three times a day (TID) | INTRAMUSCULAR | Status: DC | PRN

## 2017-01-20 MED ORDER — SODIUM CHLORIDE 0.9 % IV SOLN
INTRAVENOUS | Status: DC | PRN
  Administered 2017-01-20 (×3): 5 mL via EPIDURAL

## 2017-01-20 MED ORDER — ACETAMINOPHEN 500 MG PO TABS
1000.0000 mg | ORAL_TABLET | Freq: Four times a day (QID) | ORAL | Status: DC | PRN
  Administered 2017-01-20: 1000 mg via ORAL

## 2017-01-20 MED ORDER — FENTANYL 2.5 MCG/ML W/ROPIVACAINE 0.2% IN NS 100 ML EPIDURAL INFUSION (ARMC-ANES)
EPIDURAL | Status: AC
  Filled 2017-01-20: qty 100

## 2017-01-20 MED ORDER — NALOXONE HCL 2 MG/2ML IJ SOSY
1.0000 ug/kg/h | PREFILLED_SYRINGE | INTRAVENOUS | Status: DC | PRN
  Filled 2017-01-20: qty 2

## 2017-01-20 MED ORDER — TERBUTALINE SULFATE 1 MG/ML IJ SOLN: 0.2500 mg | Freq: Once | INTRAMUSCULAR | Status: DC | PRN

## 2017-01-20 NOTE — Progress Notes (Signed)
  Labor Progress Note   26 y.o. G3P2000 @ [redacted]w[redacted]d , admitted for  Pregnancy, Labor Management. IOL for Gestational Hypertension  Subjective:  Pt is resting comfortably in bed. She has not been feeling the contractions since she had a dose of stadol around 6 am. Prior to that she was feeling them every 5 minutes.  Objective:  BP (!) 121/50 (BP Location: Right Arm)   Pulse 68   Temp 98 F (36.7 C) (Oral)   Resp 16   Ht 5\' 2"  (1.575 m)   Wt 218 lb (98.9 kg)   LMP  (LMP Unknown)   BMI 39.87 kg/m  Abd: mild Extr: trace to 1+ bilateral pedal edema SVE: CERVIX: 3 cm dilated, 60/70 effaced, -3 station, cervix swept and stretched to 3.5cm during exam  EFM: FHR: 150 bpm, variability: moderate,  accelerations:  Present,  decelerations:  Absent Toco: Frequency: Every 5-6 minutes Labs: I have reviewed the patient's lab results.   Assessment & Plan:  G3P2000 @ [redacted]w[redacted]d, admitted for  Pregnancy and Labor/Delivery Management  1. Pain management: IV analgesia. 2. FWB: FHT category I.  3. ID: GBS negative 4. Labor management: Cervidil removed, cervix swept. Breakfast/shower/walk then start Pitocin All discussed with patient, see orders   Ayren Zumbro, CNM

## 2017-01-20 NOTE — Progress Notes (Signed)
This note also relates to the following rows which could not be included: SpO2 - Cannot attach notes to unvalidated device data  RN in room - TOCO tracing patient HB during epidural on occasions.  Toco continually adjusted.

## 2017-01-20 NOTE — Anesthesia Preprocedure Evaluation (Signed)
Anesthesia Evaluation  Patient identified by MRN, date of birth, ID band Patient awake    Reviewed: Allergy & Precautions, NPO status , Patient's Chart, lab work & pertinent test results  History of Anesthesia Complications Negative for: history of anesthetic complications  Airway Mallampati: II  TM Distance: >3 FB Neck ROM: Full    Dental no notable dental hx.    Pulmonary neg pulmonary ROS, neg sleep apnea, neg COPD,    breath sounds clear to auscultation- rhonchi (-) wheezing      Cardiovascular hypertension (gHTN), (-) CAD and (-) Past MI  Rhythm:Regular Rate:Normal - Systolic murmurs and - Diastolic murmurs    Neuro/Psych Anxiety negative neurological ROS     GI/Hepatic negative GI ROS, Neg liver ROS,   Endo/Other  Diabetes: hx of gDM in prior pregnancy.  Renal/GU negative Renal ROS     Musculoskeletal negative musculoskeletal ROS (+)   Abdominal (+) + obese, Gravid abdomen   Peds  Hematology negative hematology ROS (+)   Anesthesia Other Findings Past Medical History: No date: Anxiety No date: Gestational diabetes     Comment: diet controlled   Reproductive/Obstetrics (+) Pregnancy                             Anesthesia Physical Anesthesia Plan  ASA: II  Anesthesia Plan: Epidural   Post-op Pain Management:    Induction:   Airway Management Planned:   Additional Equipment:   Intra-op Plan:   Post-operative Plan:   Informed Consent: I have reviewed the patients History and Physical, chart, labs and discussed the procedure including the risks, benefits and alternatives for the proposed anesthesia with the patient or authorized representative who has indicated his/her understanding and acceptance.     Plan Discussed with: Anesthesiologist  Anesthesia Plan Comments: (Plan for epidural for labor, discussed epidural vs spinal vs GA if need for csection)         Lab Results  Component Value Date   WBC 13.6 (H) 01/19/2017   HGB 11.1 (L) 01/19/2017   HCT 30.9 (L) 01/19/2017   MCV 75.6 (L) 01/19/2017   PLT 214 01/19/2017    Anesthesia Quick Evaluation

## 2017-01-20 NOTE — Anesthesia Procedure Notes (Signed)
Epidural Patient location during procedure: OB Start time: 01/20/2017 7:47 PM End time: 01/20/2017 8:06 PM  Staffing Anesthesiologist: Emmie Niemann Performed: anesthesiologist   Preanesthetic Checklist Completed: patient identified, site marked, surgical consent, pre-op evaluation, timeout performed, IV checked, risks and benefits discussed and monitors and equipment checked  Epidural Patient position: sitting Prep: ChloraPrep Patient monitoring: heart rate, continuous pulse ox and blood pressure Approach: midline Location: L3-L4 Injection technique: LOR saline  Needle:  Needle type: Tuohy  Needle gauge: 18 G Needle length: 9 cm and 9 Needle insertion depth: 7 cm Catheter type: closed end flexible Catheter size: 20 Guage Catheter at skin depth: 12 cm Test dose: negative (0.125% bupivacaine)  Assessment Events: blood not aspirated, injection not painful, no injection resistance, negative IV test and no paresthesia  Additional Notes   Patient tolerated the insertion well without complications.Reason for block:procedure for pain

## 2017-01-20 NOTE — Progress Notes (Signed)
Patient requests epidural:19:28  MD: [place]  MD notified:  19:32  MD in room:  19:47  Test dose given by MD:  19:57  Maternal heart rate:  87  Bolus 1 given by MD: 20:02  Bolus 2 given by MD: 20:05  Drip started: ID:[id number]

## 2017-01-21 DIAGNOSIS — Z3A36 36 weeks gestation of pregnancy: Secondary | ICD-10-CM

## 2017-01-21 DIAGNOSIS — O134 Gestational [pregnancy-induced] hypertension without significant proteinuria, complicating childbirth: Secondary | ICD-10-CM

## 2017-01-21 LAB — CBC
HEMATOCRIT: 29.7 % — AB (ref 35.0–47.0)
HEMOGLOBIN: 10.3 g/dL — AB (ref 12.0–16.0)
MCH: 26.5 pg (ref 26.0–34.0)
MCHC: 34.6 g/dL (ref 32.0–36.0)
MCV: 76.7 fL — AB (ref 80.0–100.0)
Platelets: 204 10*3/uL (ref 150–440)
RBC: 3.87 MIL/uL (ref 3.80–5.20)
RDW: 15.3 % — ABNORMAL HIGH (ref 11.5–14.5)
WBC: 14.1 10*3/uL — ABNORMAL HIGH (ref 3.6–11.0)

## 2017-01-21 MED ORDER — DIPHENHYDRAMINE HCL 25 MG PO CAPS: 25.0000 mg | ORAL_CAPSULE | Freq: Four times a day (QID) | ORAL | Status: DC | PRN

## 2017-01-21 MED ORDER — ACETAMINOPHEN 325 MG PO TABS
650.0000 mg | ORAL_TABLET | ORAL | Status: DC | PRN
  Administered 2017-01-21: 650 mg via ORAL
  Filled 2017-01-21: qty 2

## 2017-01-21 MED ORDER — BENZOCAINE-MENTHOL 20-0.5 % EX AERO: 1.0000 "application " | INHALATION_SPRAY | CUTANEOUS | Status: DC | PRN

## 2017-01-21 MED ORDER — COCONUT OIL OIL: 1.0000 "application " | TOPICAL_OIL | Status: DC | PRN

## 2017-01-21 MED ORDER — SENNOSIDES-DOCUSATE SODIUM 8.6-50 MG PO TABS
2.0000 | ORAL_TABLET | ORAL | Status: DC
  Administered 2017-01-21 – 2017-01-22 (×2): 2 via ORAL
  Filled 2017-01-21 (×2): qty 2

## 2017-01-21 MED ORDER — DIBUCAINE 1 % RE OINT: 1.0000 "application " | TOPICAL_OINTMENT | RECTAL | Status: DC | PRN

## 2017-01-21 MED ORDER — SIMETHICONE 80 MG PO CHEW: 80.0000 mg | CHEWABLE_TABLET | ORAL | Status: DC | PRN

## 2017-01-21 MED ORDER — TETANUS-DIPHTH-ACELL PERTUSSIS 5-2.5-18.5 LF-MCG/0.5 IM SUSP: 0.5000 mL | Freq: Once | INTRAMUSCULAR | Status: DC

## 2017-01-21 MED ORDER — ONDANSETRON HCL 4 MG PO TABS: 4.0000 mg | ORAL_TABLET | ORAL | Status: DC | PRN

## 2017-01-21 MED ORDER — OXYCODONE HCL 5 MG PO TABS
5.0000 mg | ORAL_TABLET | ORAL | Status: DC | PRN
  Administered 2017-01-21 – 2017-01-22 (×3): 5 mg via ORAL
  Filled 2017-01-21 (×2): qty 1

## 2017-01-21 MED ORDER — ONDANSETRON HCL 4 MG/2ML IJ SOLN: 4.0000 mg | INTRAMUSCULAR | Status: DC | PRN

## 2017-01-21 MED ORDER — IBUPROFEN 600 MG PO TABS
600.0000 mg | ORAL_TABLET | Freq: Four times a day (QID) | ORAL | Status: DC
  Administered 2017-01-21 – 2017-01-23 (×10): 600 mg via ORAL
  Filled 2017-01-21 (×10): qty 1

## 2017-01-21 MED ORDER — PRENATAL MULTIVITAMIN CH
1.0000 | ORAL_TABLET | Freq: Every day | ORAL | Status: DC
  Administered 2017-01-21 – 2017-01-23 (×3): 1 via ORAL
  Filled 2017-01-21 (×4): qty 1

## 2017-01-21 MED ORDER — WITCH HAZEL-GLYCERIN EX PADS: 1.0000 "application " | MEDICATED_PAD | CUTANEOUS | Status: DC | PRN

## 2017-01-21 MED ORDER — OXYCODONE HCL 5 MG PO TABS
10.0000 mg | ORAL_TABLET | ORAL | Status: DC | PRN
  Filled 2017-01-21: qty 2

## 2017-01-21 NOTE — Discharge Summary (Signed)
OB Discharge Summary     Patient Name: Patricia Henson DOB: 05-23-1991 MRN: [id number]  Date of admission: 01/19/2017 Delivering Provider: Rod Can, CNM  Date of Delivery: 01/21/2017  Date of discharge: 01/23/2017  Admitting diagnosis: Gestational Hypertension, headache Intrauterine pregnancy: [redacted]w[redacted]d     Secondary diagnosis: closely spaced pregnancies, obesity, hgb c trait, history of marijuana use, mild anemia in 3rd trimester     Discharge diagnosis: Term Pregnancy Delivered and Gestational Hypertension                                                                                                Post partum procedures:none  Augmentation: Pitocin  Complications: None  Hospital course:  Induction of Labor With Vaginal Delivery   26 y.o. yo G3P2000 at [redacted]w[redacted]d was admitted to the hospital 01/19/2017 for induction of labor.   Indication for induction: Gestational hypertension.   Patient had an uncomplicated labor course as follows: Membrane Rupture Time/Date: 12:18 AM ,01/21/2017   Patient had delivery of a viable female at 12:18 AM on 01/21/2017 Details of delivery can be found in separate delivery note.   Patient had a routine postpartum course.  Patient is discharged home 01/23/2017  Physical exam  Vitals:   01/22/17 1136 01/22/17 1604 01/22/17 1913 01/23/17 0744  BP: 123/71  126/79 115/65  Pulse: 92  88 93  Resp: 20  20 18   Temp: 98 F (36.7 C) 98.5 F (36.9 C) 98.3 F (36.8 C) 99 F (37.2 C)  TempSrc: Oral Oral Oral Oral  SpO2: 98%   100%  Weight:      Height:       General: alert, cooperative and no distress Lochia: appropriate Uterine Fundus: firm/ U-1/ML/NT Incision: N/A DVT Evaluation: No evidence of DVT seen on physical exam.  Labs: Lab Results  Component Value Date   WBC 14.1 (H) 01/21/2017   HGB 10.3 (L) 01/21/2017   HCT 29.7 (L) 01/21/2017   MCV 76.7 (L) 01/21/2017   PLT 204 01/21/2017   CMP Latest Ref Rng & Units 01/19/2017  Glucose 65 - 99 mg/dL 92   BUN 6 - 20 mg/dL 8  Creatinine 0.44 - 1.00 mg/dL 0.36(L)  Sodium 135 - 145 mmol/L 135  Potassium 3.5 - 5.1 mmol/L 3.8  Chloride 101 - 111 mmol/L 107  CO2 22 - 32 mmol/L 21(L)  Calcium 8.9 - 10.3 mg/dL 8.7(L)  Total Protein 6.5 - 8.1 g/dL 6.5  Total Bilirubin 0.3 - 1.2 mg/dL 0.4  Alkaline Phos 38 - 126 U/L 83  AST 15 - 41 U/L 22  ALT 14 - 54 U/L 11(L)    Discharge instruction: per After Visit Summary.  Medications:  Motrin 600 mgm po every 6 hours if needed for pain Prenatal vitamins daily  Diet: Routine  Activity: Advance as tolerated. Pelvic rest for 6 weeks.   Outpatient follow up: Follow-up Information    Rod Can, CNM. Schedule an appointment as soon as possible for a visit in 6 week(s).   Specialty:  Obstetrics Why:  Can schedule a Nexplanon insertion sooner if desires. Please tell scheduler if desires Nexplanon  or Mirena IUD inserted at 6 week check up. Contact information: [address] [place] [address] [phone]             Postpartum contraception: IUD Mirena/ or Nexplanon Rhogam Given postpartum: NA Rubella vaccine given postpartum: Rubella Immune Varicella vaccine given postpartum: Varicella Immune TDaP given antepartum or postpartum: last dose 01/2016  Newborn Data: Live born female / Patricia Henson seven Birth Weight: 6 lb 13.4 oz (3100 g) APGAR: 8, 9   Baby Feeding: Bottle  Disposition:home with mother  SIGNED:  Dalia Heading, CNM 01/23/2017 10:25 AM

## 2017-01-21 NOTE — Progress Notes (Signed)
Post Partum Day DOD Subjective: no complaints, up ad lib, voiding and tolerating PO  Objective: Blood pressure 128/70, pulse 98, temperature 98 F (36.7 C), temperature source Oral, resp. rate 20, height 5\' 2"  (1.575 m), weight 218 lb (98.9 kg), SpO2 97 %, not currently breastfeeding.  Physical Exam: BP 128/70 (BP Location: Left Arm)   Pulse 98   Temp 98 F (36.7 C) (Oral)   Resp 20   Ht 5\' 2"  (1.575 m)   Wt 218 lb (98.9 kg)   LMP  (LMP Unknown)   SpO2 97%   BMI 39.87 kg/m  General: alert, cooperative and no distress Lochia: appropriate Uterine Fundus: firm/ U+1/ML/NT (has urge to void) DVT Evaluation: No evidence of DVT seen on physical exam.   Recent Labs  01/19/17 0705 01/21/17 0418  HGB 11.1* 10.3*  HCT 30.9* 29.7*  WBC 13.6* 14.1*  PLT 214 204    Assessment/Plan: Stable DOD  Possible discharge tomorrow  Bottle  A+/RI/VI Mild iron deficiency anemia  Vitamin and iron Gestational HTN  Normotensive.   LOS: 2 days   Dalia Heading 01/21/2017, 12:39 PM

## 2017-01-21 NOTE — Discharge Summary (Signed)
Physician Discharge Summary Note  Patient ID: ISLAH RAGGS MRN: [id number] DOB/AGE: May 13, 1991 26 y.o.  Admit date: 01/19/2017 Admitting provider: Will Bonnet, MD Discharge date: 01/21/2017   Admission Diagnoses:  false labor at [redacted]w[redacted]d  Discharge Diagnoses:  false labor at [redacted]w[redacted]d Gestational hypertension  History of Present Illness: Patricia Henson is a 26 y.o. G20P2000 female at [redacted]w[redacted]d dated by 12 week ultrasound.  Her pregnancy has been complicated by closely spaced pregnancies (last delivery 5/17), obesity with initial BMI 35 (20 pound weight gain this pregnancy), hgb c trait, history of marijuana use, mild anemia in third trimester.    She reports contractions.   She denies leakage of fluid.   She denies vaginal bleeding.   She reports fetal movement.    She notes a left temporal headache. She has a history of headaches in this pregnancy that have been present for the past month. She states that this headache is a bit different and severe.  She noted seeing spots upon arrival to L&D, but not currently.  She denies RUQ pain.Marland Kitchen   Hospital Course:  The patient was admitted and found not to be in labor.  However, she was noted to have elevated blood pressures. She also complained of a headache.  Labs were normal and she had insignificant protein in her urine.  She reported a month-long headache.  However, it was difficult to determine whether the headache was new and related to her blood pressure issue or if the headache was her usual headache she had been having.  It was recommended that she stay for induction of labor considering this.  However, she had some social issues which needed addressing.  She was advised to not leave the hospital give the uncertainty of her diagnosis.  She was counseled regarding the risks of seizure, stroke, maternal and fetal death, abruption. With this understanding, she did sign out against medical advice with the promise that she would return ASAP after  she had settled out her home social scenario.    Past Medical History:  Diagnosis Date  . Anxiety   . Gestational diabetes    diet controlled    Past Surgical History:  Procedure Laterality Date  . CHOLECYSTECTOMY      No current facility-administered medications on file prior to encounter.    No current outpatient prescriptions on file prior to encounter.    Allergies  Allergen Reactions  . Penicillins Rash        Social History   Social History  . Marital status: Single    Spouse name: N/A  . Number of children: N/A  . Years of education: N/A   Occupational History  . Not on file.   Social History Main Topics  . Smoking status: Never Smoker  . Smokeless tobacco: Never Used  . Alcohol use No  . Drug use: Yes    Types: Marijuana  . Sexual activity: Yes    Birth control/ protection: None   Other Topics Concern  . Not on file   Social History Narrative  . No narrative on file   Family History  Problem Relation Age of Onset  . Diabetes Mother      Physical Exam: BP (!) 126/56   Pulse (!) 110   Temp 98.4 F (36.9 C) (Oral)   Resp 16   Ht 5\' 2"  (1.575 m)   Wt 218 lb (98.9 kg)   LMP  (LMP Unknown)   BMI 39.87 kg/m   Gen: NAD CV:  RRR Pulm: CTAB Abd: soft, gravid, nontender Pelvic: deferred Ext: no e/c/t  Consults: None  Significant Findings/ Diagnostic Studies:  See labs in chart  Procedures: NST - reactive (see prior documentation)  Discharge Condition: stable  Disposition: 07-Left Against Medical Advice/Left Without Being Seen/Elopement  Diet: Regular diet  Discharge Activity: Activity as tolerated  Disposition: Patient left against medical advice    Signed: Prentice Docker, MD  01/21/2017, 9:37 AM

## 2017-01-22 NOTE — Anesthesia Postprocedure Evaluation (Signed)
Anesthesia Post Note  Patient: Patricia Henson  Procedure(s) Performed: * No procedures listed *  Patient location during evaluation: [facility] Unit Level of consciousness: awake, awake and alert and oriented Pain management: pain level controlled Vital Signs Assessment: post-procedure vital signs reviewed and stable Respiratory status: spontaneous breathing Cardiovascular status: blood pressure returned to baseline Postop Assessment: no headache Anesthetic complications: no     Last Vitals:  Vitals:   01/21/17 2333 01/22/17 0732  BP: 129/67 110/65  Pulse: 81 82  Resp: 18 20  Temp: 36.7 C 36.9 C    Last Pain:  Vitals:   01/22/17 0754  TempSrc:   PainSc: 0-No pain                 Buckner Malta

## 2017-01-22 NOTE — Progress Notes (Signed)
Post Partum Day 1 Subjective: no complaints, up ad lib, voiding and tolerating PO  Objective: Blood pressure 110/65, pulse 82, temperature 98.5 F (36.9 C), temperature source Oral, resp. rate 20, height 5\' 2"  (1.575 m), weight 218 lb (98.9 kg), SpO2 98 %, not currently breastfeeding.  Physical Exam: BP 110/65 (BP Location: Left Arm)   Pulse 82   Temp 98.5 F (36.9 C) (Oral)   Resp 20   Ht 5\' 2"  (1.575 m)   Wt 218 lb (98.9 kg)   LMP  (LMP Unknown)   SpO2 98%   BMI 39.87 kg/m  General: alert, cooperative and no distress Lochia: appropriate Uterine Fundus: firm/ U+1/ML/NT (has urge to void) DVT Evaluation: No evidence of DVT seen on physical exam.   Recent Labs  01/21/17 0418  HGB 10.3*  HCT 29.7*  WBC 14.1*  PLT 204    Assessment/Plan: Stable PPD #1  Bottle  A+/RI/VI Mild iron deficiency anemia  Vitamin and iron Gestational HTN  Normotensive.   LOS: 3 days   Hoyt Koch 01/22/2017, 8:45 AM

## 2017-01-23 ENCOUNTER — Encounter: Payer: Self-pay | Admitting: Obstetrics and Gynecology

## 2017-01-23 MED ORDER — IBUPROFEN 600 MG PO TABS: 600.0000 mg | ORAL_TABLET | Freq: Four times a day (QID) | ORAL | 1 refills | Status: DC | PRN

## 2017-01-23 MED ORDER — PRENATAL MULTIVITAMIN CH: 1.0000 | ORAL_TABLET | Freq: Every day | ORAL | 2 refills | Status: DC

## 2017-01-23 NOTE — Discharge Instructions (Signed)
Please call your doctor or return to the ER if you experience any chest pains, shortness of breath, fever greater than 101, any heavy bleeding (saturating more than 1 pad per hour), large clots, or foul smelling discharge, any worsening abdominal pain and cramping that is not controlled by pain medication, or any signs of postpartum depression. No tampons, enemas, douches, or sexual intercourse for 6 weeks. Also avoid tub baths, hot tubs, or swimming for 6 weeks.    Vaginal Delivery, Care After Refer to this sheet in the next few weeks. These discharge instructions provide you with information on caring for yourself after delivery. Your caregiver may also give you specific instructions. Your treatment has been planned according to the most current medical practices available, but problems sometimes occur. Call your caregiver if you have any problems or questions after you go home. HOME CARE INSTRUCTIONS 1. Take over-the-counter or prescription medicines only as directed by your caregiver or pharmacist. 2. Do not drink alcohol, especially if you are breastfeeding or taking medicine to relieve pain. 3. Do not smoke tobacco. 4. Continue to use good perineal care. Good perineal care includes: 1. Wiping your perineum from back to front 2. Keeping your perineum clean. 3. You can do sitz baths twice a day, to help keep this area clean 5. Do not use tampons, douche or have sex until your caregiver says it is okay. 6. Shower only and avoid sitting in submerged water, aside from sitz baths 7. Wear a well-fitting bra that provides breast support. 8. Eat healthy foods. 9. Drink enough fluids to keep your urine clear or pale yellow. 10. Eat high-fiber foods such as whole grain cereals and breads, brown rice, beans, and fresh fruits and vegetables every day. These foods may help prevent or relieve constipation. 11. Avoid constipation with high fiber foods or medications, such as miralax or metamucil 12. Follow  your caregiver's recommendations regarding resumption of activities such as climbing stairs, driving, lifting, exercising, or traveling. 73. Talk to your caregiver about resuming sexual activities. Resumption of sexual activities is dependent upon your risk of infection, your rate of healing, and your comfort and desire to resume sexual activity. 14. Try to have someone help you with your household activities and your newborn for at least a few days after you leave the hospital. 15. Rest as much as possible. Try to rest or take a nap when your newborn is sleeping. 16. Increase your activities gradually. 2. Keep all of your scheduled postpartum appointments. It is very important to keep your scheduled follow-up appointments. At these appointments, your caregiver will be checking to make sure that you are healing physically and emotionally. SEEK MEDICAL CARE IF:   You are passing large clots from your vagina. Save any clots to show your caregiver.  You have a foul smelling discharge from your vagina.  You have trouble urinating.  You are urinating frequently.  You have pain when you urinate.  You have a change in your bowel movements.  You have increasing redness, pain, or swelling near your vaginal incision (episiotomy) or vaginal tear.  You have pus draining from your episiotomy or vaginal tear.  Your episiotomy or vaginal tear is separating.  You have painful, hard, or reddened breasts.  You have a severe headache.  You have blurred vision or see spots.  You feel sad or depressed.  You have thoughts of hurting yourself or your newborn.  You have questions about your care, the care of your newborn, or medicines.  You are dizzy or light-headed.  You have a rash.  You have nausea or vomiting.  You were breastfeeding and have not had a menstrual period within 12 weeks after you stopped breastfeeding.  You are not breastfeeding and have not had a menstrual period by the  12th week after delivery.  You have a fever. SEEK IMMEDIATE MEDICAL CARE IF:   You have persistent pain.  You have chest pain.  You have shortness of breath.  You faint.  You have leg pain.  You have stomach pain.  Your vaginal bleeding saturates two or more sanitary pads in 1 hour. MAKE SURE YOU:   Understand these instructions.  Will watch your condition.  Will get help right away if you are not doing well or get worse. Document Released: 11/08/2000 Document Revised: 03/28/2014 Document Reviewed: 07/08/2012 [facility] Patient Information 2015 [place], [place]. This information is not intended to replace advice given to you by your health care provider. Make sure you discuss any questions you have with your health care provider.  Sitz Bath A sitz bath is a warm water bath taken in the sitting position. The water covers only the hips and butt (buttocks). We recommend using one that fits in the toilet, to help with ease of use and cleanliness. It may be used for either healing or cleaning purposes. Sitz baths are also used to relieve pain, itching, or muscle tightening (spasms). The water may contain medicine. Moist heat will help you heal and relax.  HOME CARE  Take 3 to 4 sitz baths a day. 18. Fill the bathtub half-full with warm water. 19. Sit in the water and open the drain a little. 20. Turn on the warm water to keep the tub half-full. Keep the water running constantly. 21. Soak in the water for 15 to 20 minutes. 22. After the sitz bath, pat the affected area dry. GET HELP RIGHT AWAY IF: You get worse instead of better. Stop the sitz baths if you get worse. MAKE SURE YOU:  Understand these instructions.  Will watch your condition.  Will get help right away if you are not doing well or get worse. Document Released: 12/19/2004 Document Revised: 08/05/2012 Document Reviewed: 03/11/2011 [facility] Patient Information 2015 [place], [place]. This information is not intended to  replace advice given to you by your health care provider. Make sure you discuss any questions you have with your health care provider.

## 2017-01-23 NOTE — Progress Notes (Signed)
Discharge order received from doctor. Reviewed discharge instructions and prescriptions with patient and answered all questions. Follow up appointment instructions given. Patient verbalized understanding. ID bands checked. Patient discharged home with infant via wheelchair by nursing/auxillary.    Annsley Akkerman Garner, RN  

## 2017-04-03 ENCOUNTER — Ambulatory Visit: Payer: Self-pay | Admitting: Advanced Practice Midwife

## 2017-05-01 IMAGING — CT CT ABD-PELV W/O CM
3 of 4 series · 9 of 46 positions shown, 16 images · non-contrast
Comparison: None.

CLINICAL DATA: Lower abdominal pain radiating to the flanks. Nausea
and vomiting. Dysuria for 1 day

EXAM:
CT ABDOMEN AND PELVIS WITHOUT CONTRAST
TECHNIQUE: Multidetector CT imaging of the abdomen and pelvis was performed
following the standard protocol without oral or intravenous contrast
material administration.

[Series 4: lung · axial · 0.78mm/px · z∈[-592,-516]mm · 5 of 23 slices shown, 10 images]
[im 4/23  soft-tissue]
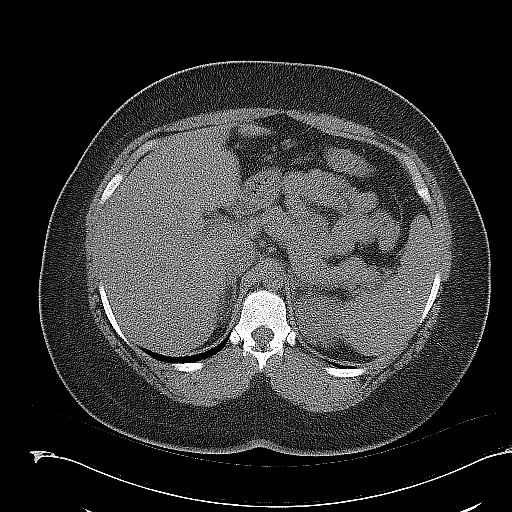
[im 4/23  bone]
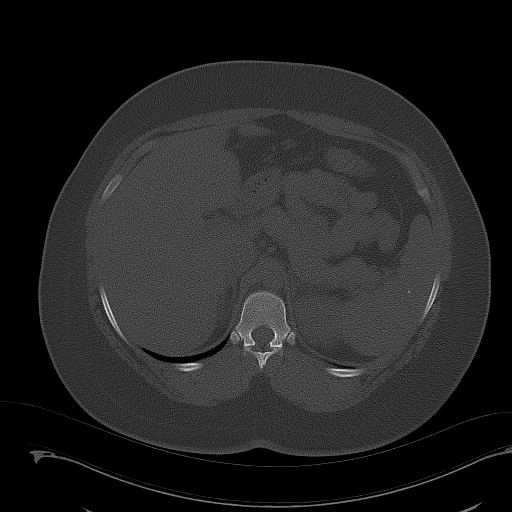
[im 8/23  soft-tissue]
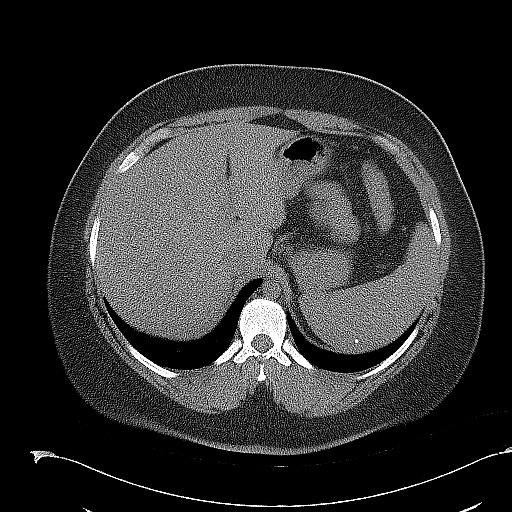
[im 8/23  lung]
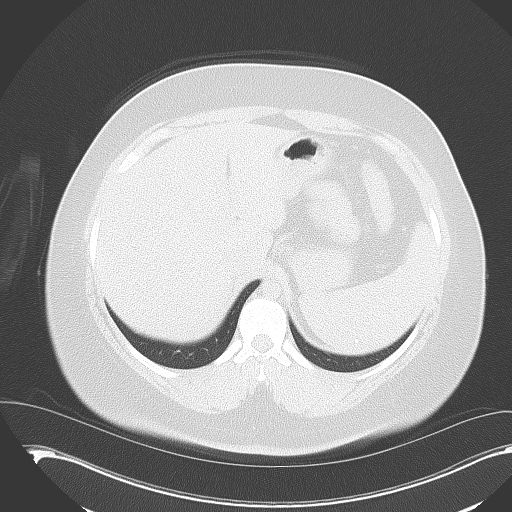
[im 12/23  soft-tissue]
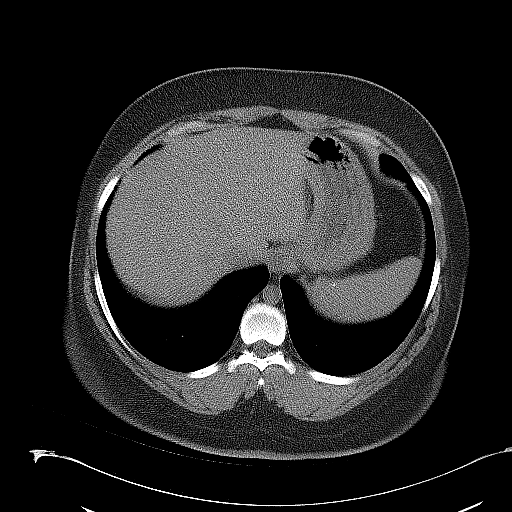
[im 12/23  lung]
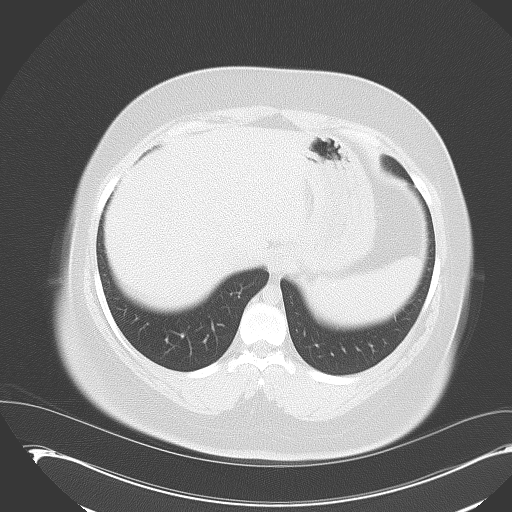
[im 15/23  soft-tissue]
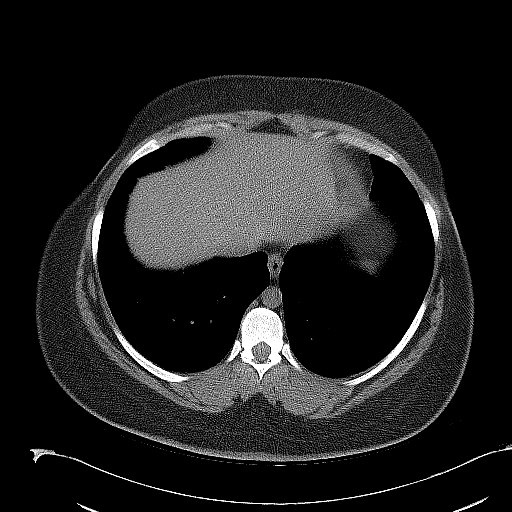
[im 15/23  lung]
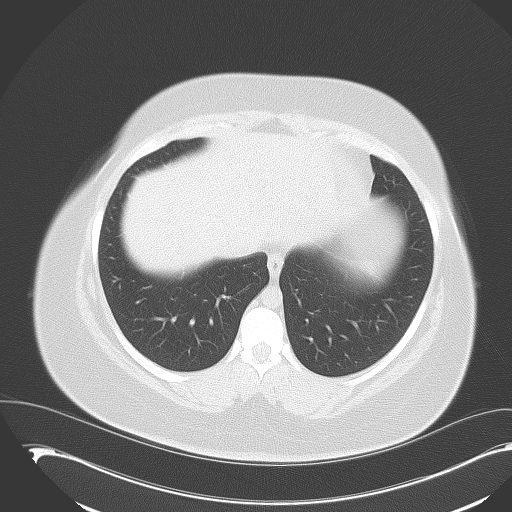
[im 19/23  soft-tissue]
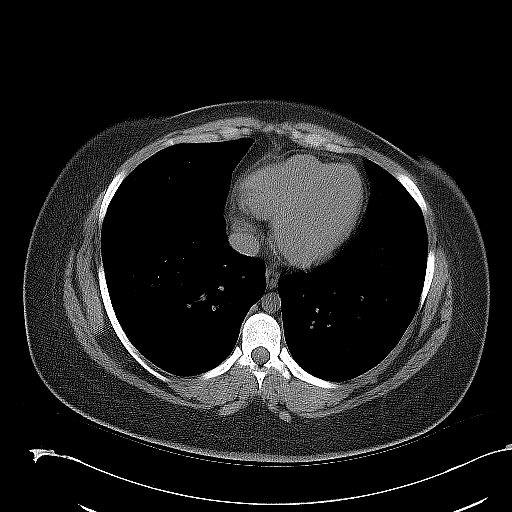
[im 19/23  lung]
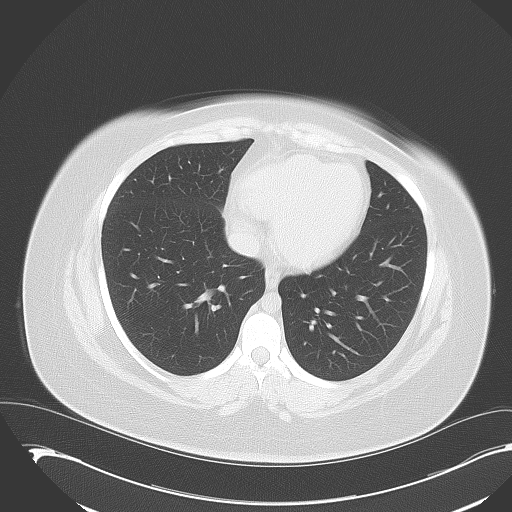

[Series 5: coronal · coronal · 0.77mm/px · 3 of 148 slices shown, 4 images]
[im 50/148  soft-tissue]
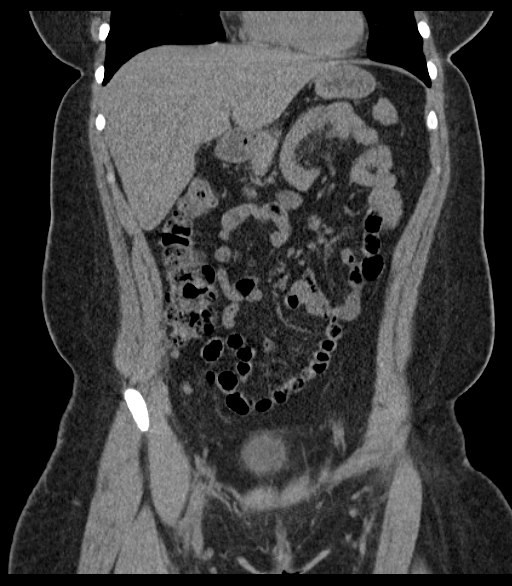
[im 66/148  soft-tissue]
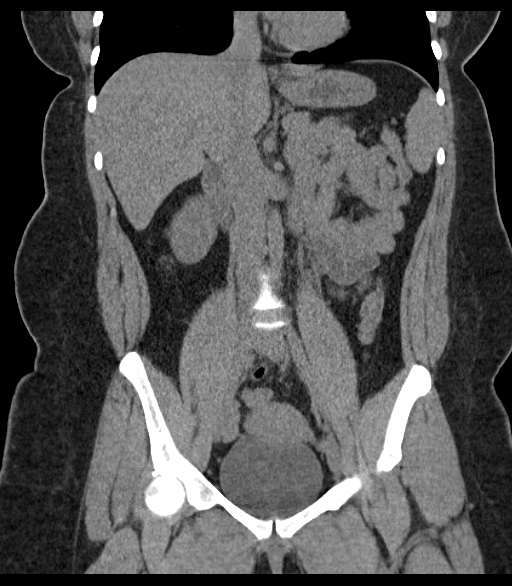
[im 66/148  bone]
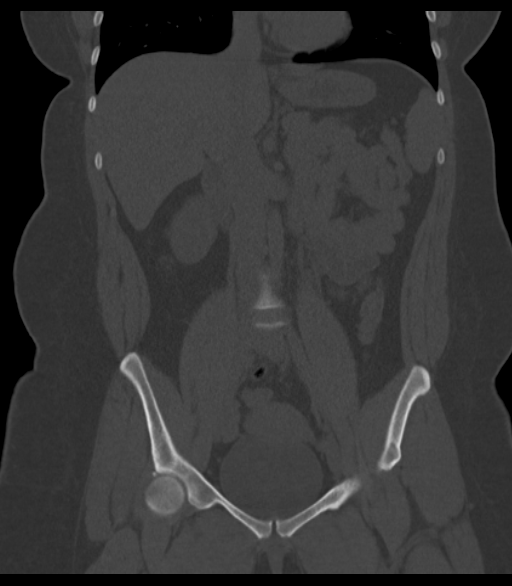
[im 82/148  soft-tissue]
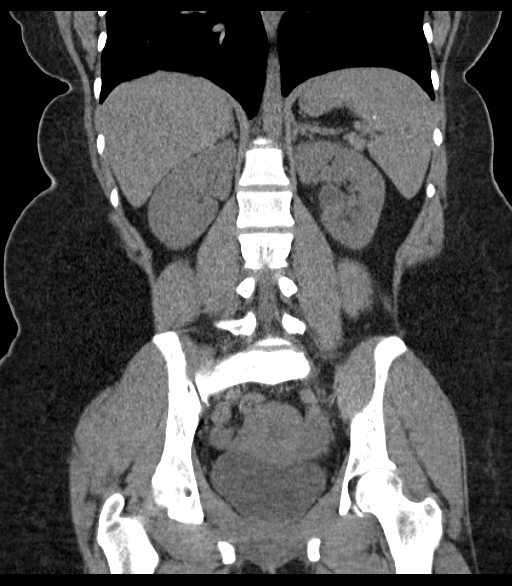

[Series 6: sagittal · sagittal · 0.64mm/px · 1 of 190 slices shown, 2 images]
[im 64/190  soft-tissue]
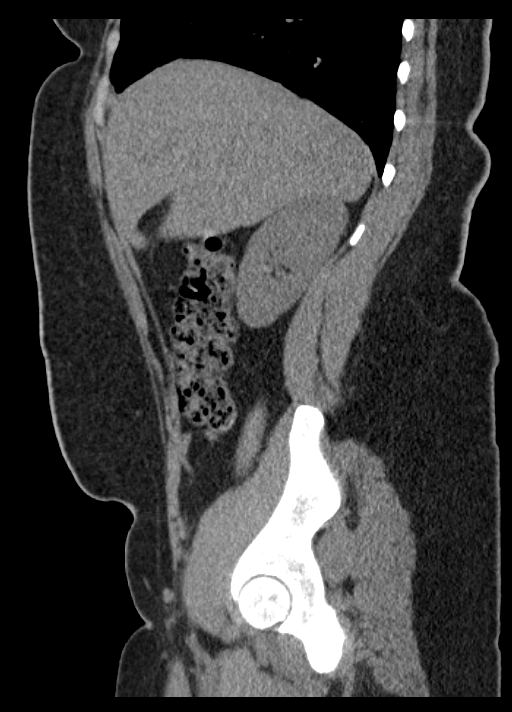
[im 64/190  bone]
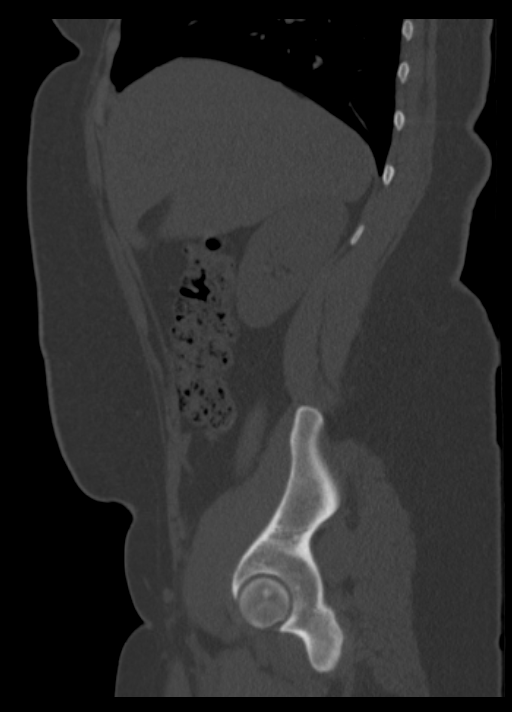

[9 of 46 positions shown; findings below may reference images not displayed]

FINDINGS: Lung bases are clear.

No focal liver lesions are identified on this noncontrast enhanced
study. There is no appreciable biliary duct dilatation. Gallbladder
is not appreciable on this study.

There are small calcifications in the spleen consistent with
granulomatous disease. Spleen otherwise appears unremarkable.
Pancreas and adrenals appear normal.

Kidneys bilaterally show no mass or hydronephrosis on either side.
There is no intrarenal calculus on either side. There is a 2 mm
calculus just proximal to the ureterovesical junction on the left.
No other ureteral calculi are identified.

In the pelvis, the urinary bladder is midline with normal wall
thickness. There is no pelvic mass. There is minimal free fluid in
the cul-de-sac. The appendix appears normal.

There is no bowel obstruction. No free air or portal venous air.
There is no appreciable ascites, adenopathy, or abscess in the
abdomen or pelvis. There is no abdominal aortic aneurysm. There are
no blastic or lytic bone lesions.
IMPRESSION: 2 mm calculus distal left ureter without appreciable hydronephrosis.
No intrarenal calculi identified on either side.

Appendix appears normal.  No bowel obstruction.  No abscess.

Scattered calcified granulomas in spleen.

## 2017-08-27 ENCOUNTER — Inpatient Hospital Stay (HOSPITAL_COMMUNITY): Payer: Medicaid Other

## 2017-08-27 ENCOUNTER — Inpatient Hospital Stay (HOSPITAL_COMMUNITY)
Admission: AD | Admit: 2017-08-27 | Discharge: 2017-08-27 | Disposition: A | Discharge status: Home / Self Care | Payer: Medicaid Other | Source: Ambulatory Visit | Attending: Obstetrics & Gynecology | Admitting: Obstetrics & Gynecology

## 2017-08-27 ENCOUNTER — Encounter (HOSPITAL_COMMUNITY): Payer: Self-pay | Admitting: *Deleted

## 2017-08-27 DIAGNOSIS — O26891 Other specified pregnancy related conditions, first trimester: Secondary | ICD-10-CM

## 2017-08-27 DIAGNOSIS — O131 Gestational [pregnancy-induced] hypertension without significant proteinuria, first trimester: Secondary | ICD-10-CM | POA: Insufficient documentation

## 2017-08-27 DIAGNOSIS — O10919 Unspecified pre-existing hypertension complicating pregnancy, unspecified trimester: Secondary | ICD-10-CM | POA: Diagnosis not present

## 2017-08-27 DIAGNOSIS — R109 Unspecified abdominal pain: Secondary | ICD-10-CM

## 2017-08-27 DIAGNOSIS — Z3A00 Weeks of gestation of pregnancy not specified: Secondary | ICD-10-CM | POA: Diagnosis not present

## 2017-08-27 DIAGNOSIS — O3680X0 Pregnancy with inconclusive fetal viability, not applicable or unspecified: Secondary | ICD-10-CM

## 2017-08-27 DIAGNOSIS — R1030 Lower abdominal pain, unspecified: Secondary | ICD-10-CM | POA: Diagnosis present

## 2017-08-27 DIAGNOSIS — O26899 Other specified pregnancy related conditions, unspecified trimester: Secondary | ICD-10-CM

## 2017-08-27 DIAGNOSIS — Z8632 Personal history of gestational diabetes: Secondary | ICD-10-CM | POA: Diagnosis not present

## 2017-08-27 LAB — URINALYSIS, ROUTINE W REFLEX MICROSCOPIC
Bilirubin Urine: NEGATIVE
Glucose, UA: NEGATIVE mg/dL
HGB URINE DIPSTICK: NEGATIVE
Ketones, ur: NEGATIVE mg/dL
LEUKOCYTES UA: NEGATIVE
Nitrite: NEGATIVE
PROTEIN: NEGATIVE mg/dL
Specific Gravity, Urine: 1.017 (ref 1.005–1.030)
pH: 8 (ref 5.0–8.0)

## 2017-08-27 LAB — WET PREP, GENITAL
Sperm: NONE SEEN
TRICH WET PREP: NONE SEEN
YEAST WET PREP: NONE SEEN

## 2017-08-27 LAB — CBC
HCT: 35.3 % — ABNORMAL LOW (ref 36.0–46.0)
HEMOGLOBIN: 12.6 g/dL (ref 12.0–15.0)
MCH: 27.2 pg (ref 26.0–34.0)
MCHC: 35.7 g/dL (ref 30.0–36.0)
MCV: 76.2 fL — ABNORMAL LOW (ref 78.0–100.0)
Platelets: 274 10*3/uL (ref 150–400)
RBC: 4.63 MIL/uL (ref 3.87–5.11)
RDW: 15.3 % (ref 11.5–15.5)
WBC: 12 10*3/uL — AB (ref 4.0–10.5)

## 2017-08-27 LAB — HCG, QUANTITATIVE, PREGNANCY: HCG, BETA CHAIN, QUANT, S: 969 m[IU]/mL — AB (ref <5)

## 2017-08-27 LAB — POCT PREGNANCY, URINE: PREG TEST UR: POSITIVE — AB

## 2017-08-27 NOTE — MAU Note (Signed)
Found out preg last Friday at HD.  Cycle has not been regular since delivery 7 months ago.  Started having pain last night cramping, feels like period was going to come on.

## 2017-08-27 NOTE — MAU Provider Note (Signed)
History     CSN: [id number]  Arrival date and time: 08/27/17 1111  First Provider Initiated Contact with Patient 08/27/17 1343     Chief Complaint  Patient presents with  . Abdominal Pain  . Possible Pregnancy   HPI Patricia Henson is a 26 y/o [phone] who presents today for evaluation of bilateral lower abdominal pain in the setting of pregnancy. Pain began suddenly last night at approximately 2200 hours. Patient describes pain as "cramping" and feels similar to menstrual cramps. Pain has been constant. She has not taken anything. Laying down seems to make pain better, nothing makes pain worse. Currently rates pain 5/10 on pain scale. Patient denies vaginal bleeding/discharge, dysuria, nausea, vomiting, diarrhea, constipation, fever, chills or visual disturbances. She reports occasional headaches. Patient has history of GDM and HTN in pregnancy and reports she was induced at [place] during her last pregnancy because of HTN. No other complaints noted at this time.    Past Medical History:  Diagnosis Date  . Gestational diabetes    diet controlled    Past Surgical History:  Procedure Laterality Date  . CHOLECYSTECTOMY      Family History  Problem Relation Age of Onset  . Diabetes Mother     Social History  Substance Use Topics  . Smoking status: Never Smoker  . Smokeless tobacco: Never Used  . Alcohol use No    Allergies:  Allergies  Allergen Reactions  . Penicillins Rash    Has patient had a PCN reaction causing immediate rash, facial/tongue/throat swelling, SOB or lightheadedness with hypotension: Yes Has patient had a PCN reaction causing severe rash involving mucus membranes or skin necrosis: No Has patient had a PCN reaction that required hospitalization: patient not sure Has patient had a PCN reaction occurring within the last 10 years: No If all of the above answers are "NO", then may proceed with Cephalosporin use.     Prescriptions Prior to Admission   Medication Sig Dispense Refill Last Dose  . ibuprofen (ADVIL,MOTRIN) 600 MG tablet Take 1 tablet (600 mg total) by mouth every 6 (six) hours as needed. 30 tablet 1   . Prenatal Vit-Fe Fumarate-FA (PRENATAL MULTIVITAMIN) TABS tablet Take 1 tablet by mouth daily at 12 noon. 30 tablet 2     Review of Systems  Constitutional: Negative for chills and fever.  Respiratory: Negative for shortness of breath.   Cardiovascular: Negative for chest pain.  Gastrointestinal: Positive for abdominal pain. Negative for constipation, diarrhea, nausea and vomiting.       Lower abdominal pain   Genitourinary: Negative for difficulty urinating, dysuria, vaginal bleeding, vaginal discharge and vaginal pain.  Neurological: Positive for headaches. Negative for light-headedness.   Physical Exam   Blood pressure (!) 151/86, pulse 84, temperature 98.4 F (36.9 C), temperature source Oral, resp. rate 18, weight 89.7 kg (197 lb 12 oz), SpO2 98 %, unknown if currently breastfeeding.  Physical Exam  Constitutional: She is oriented to person, place, and time. She appears well-developed and well-nourished. No distress.  Cardiovascular: Normal rate and regular rhythm.  Exam reveals no gallop and no friction rub.   Murmur heard. Respiratory: Effort normal and breath sounds normal. No respiratory distress. She has no wheezes. She has no rales.  GI: Soft. Bowel sounds are normal. She exhibits no distension and no mass. There is tenderness. There is no rebound and no guarding.  Genitourinary: There is no rash, tenderness or lesion on the right labia. There is no rash, tenderness or lesion on the  left labia. Cervix exhibits no motion tenderness, no discharge and no friability. Right adnexum displays no mass, no tenderness and no fullness. Left adnexum displays no mass, no tenderness and no fullness.  Neurological: She is alert and oriented to person, place, and time.  Skin: Skin is warm and dry.  Psychiatric: She has a  normal mood and affect.   Results for orders placed or performed during the hospital encounter of 08/27/17 (from the past 24 hour(s))  Urinalysis, Routine w reflex microscopic     Status: Abnormal   Collection Time: 08/27/17 11:47 AM  Result Value Ref Range   Color, Urine YELLOW YELLOW   APPearance CLOUDY (A) CLEAR   Specific Gravity, Urine 1.017 1.005 - 1.030   pH 8.0 5.0 - 8.0   Glucose, UA NEGATIVE NEGATIVE mg/dL   Hgb urine dipstick NEGATIVE NEGATIVE   Bilirubin Urine NEGATIVE NEGATIVE   Ketones, ur NEGATIVE NEGATIVE mg/dL   Protein, ur NEGATIVE NEGATIVE mg/dL   Nitrite NEGATIVE NEGATIVE   Leukocytes, UA NEGATIVE NEGATIVE  Pregnancy, urine POC     Status: Abnormal   Collection Time: 08/27/17 11:54 AM  Result Value Ref Range   Preg Test, Ur POSITIVE (A) NEGATIVE  CBC     Status: Abnormal   Collection Time: 08/27/17  1:47 PM  Result Value Ref Range   WBC 12.0 (H) 4.0 - 10.5 K/uL   RBC 4.63 3.87 - 5.11 MIL/uL   Hemoglobin 12.6 12.0 - 15.0 g/dL   HCT 35.3 (L) 36.0 - 46.0 %   MCV 76.2 (L) 78.0 - 100.0 fL   MCH 27.2 26.0 - 34.0 pg   MCHC 35.7 30.0 - 36.0 g/dL   RDW 15.3 11.5 - 15.5 %   Platelets 274 150 - 400 K/uL  hCG, quantitative, pregnancy     Status: Abnormal   Collection Time: 08/27/17  1:47 PM  Result Value Ref Range   hCG, Beta Chain, Quant, S 969 (H) <5 mIU/mL  Wet prep, genital     Status: Abnormal   Collection Time: 08/27/17  2:00 PM  Result Value Ref Range   Yeast Wet Prep HPF POC NONE SEEN NONE SEEN   Trich, Wet Prep NONE SEEN NONE SEEN   Clue Cells Wet Prep HPF POC PRESENT (A) NONE SEEN   WBC, Wet Prep HPF POC MANY (A) NONE SEEN   Sperm NONE SEEN    US Ob Comp Less 14 Wks  Result Date: 08/27/2017 CLINICAL DATA:  Pelvic pain.  Positive pregnancy test EXAM: OBSTETRIC <14 WK [place] AND TRANSVAGINAL OB US TECHNIQUE: Both transabdominal and transvaginal ultrasound examinations were performed for complete evaluation of the gestation as well as the maternal uterus,  adnexal regions, and pelvic cul-de-sac. Transvaginal technique was performed to assess early pregnancy. COMPARISON:  None. FINDINGS: Intrauterine gestational sac: Not visualized Yolk sac:  Not visualized Embryo:  Not visualized Cardiac Activity: Not visualized Subchorionic hemorrhage:  None visualized. Maternal uterus/adnexae: No intrauterine mass. Endometrium is thickened to 1.9 cm. The ovaries are normal in size and contour bilaterally. Right ovary measures 4.6 x 2.3 x 3.0 cm. Left ovary measures 4.3 x 4.3 x 2.6 cm. There is a corpus luteum on the left. There is a small amount of free fluid in the cul-de-sac. IMPRESSION: No intrauterine gestation is seen currently. Endometrium is thickened. There is a small amount of free pelvic fluid. There is no extrauterine pelvic mass beyond corpus luteum in left ovary. Given positive pregnancy test and lack of intrauterine gestation, differential considerations  at this time include intrauterine gestation too early to be seen by either transabdominal or transvaginal technique; recent spontaneous abortion; possible ectopic gestation. Close clinical and laboratory surveillance warranted in this regard. Timing of repeat ultrasound in part will depend on beta HCG values going forward. Electronically Signed   By: Lowella Grip III M.D.   On: 08/27/2017 14:56   US Ob Transvaginal  Result Date: 08/27/2017 CLINICAL DATA:  Pelvic pain.  Positive pregnancy test EXAM: OBSTETRIC <14 WK [place] AND TRANSVAGINAL OB US TECHNIQUE: Both transabdominal and transvaginal ultrasound examinations were performed for complete evaluation of the gestation as well as the maternal uterus, adnexal regions, and pelvic cul-de-sac. Transvaginal technique was performed to assess early pregnancy. COMPARISON:  None. FINDINGS: Intrauterine gestational sac: Not visualized Yolk sac:  Not visualized Embryo:  Not visualized Cardiac Activity: Not visualized Subchorionic hemorrhage:  None visualized. Maternal  uterus/adnexae: No intrauterine mass. Endometrium is thickened to 1.9 cm. The ovaries are normal in size and contour bilaterally. Right ovary measures 4.6 x 2.3 x 3.0 cm. Left ovary measures 4.3 x 4.3 x 2.6 cm. There is a corpus luteum on the left. There is a small amount of free fluid in the cul-de-sac. IMPRESSION: No intrauterine gestation is seen currently. Endometrium is thickened. There is a small amount of free pelvic fluid. There is no extrauterine pelvic mass beyond corpus luteum in left ovary. Given positive pregnancy test and lack of intrauterine gestation, differential considerations at this time include intrauterine gestation too early to be seen by either transabdominal or transvaginal technique; recent spontaneous abortion; possible ectopic gestation. Close clinical and laboratory surveillance warranted in this regard. Timing of repeat ultrasound in part will depend on beta HCG values going forward. Electronically Signed   By: Lowella Grip III M.D.   On: 08/27/2017 14:56   MAU Course  Procedures MDM:    Laboratory and imaging studies ordered including UPT, UA, CBC, OB U/S, ABO, beta quant, wet prep, GC/chlamydia  All laboratory and imaging studies reviewed with patient   Patient instructed to return here to MAU on Saturday for repeat Beta hCG quant.  Patient given strict return precautions including intractable pain, syncope, or vaginal bleeding  Patient discharged from MAU - likely early pregnancy but cannot exclude ectopic  - monitor BP, likely chronic- consider Labetalol  Danne Baxter, PA-S 1546 hours, 08/27/2017  Assessment and Plan   1. Pregnancy of unknown anatomic location   2. Abdominal pain affecting pregnancy   3. Chronic hypertension during pregnancy, antepartum    Discharge home Follow up in MAU on 08/30/17- am Ectopic/return precautions  I confirm that I have verified the information documented in the student's note and that I have also personally  reperformed the physical exam and all medical decision making activities.  Julianne Handler, CNM  08/27/2017 3:54 PM

## 2017-08-27 NOTE — Discharge Instructions (Signed)

## 2017-08-28 LAB — GC/CHLAMYDIA PROBE AMP (~~LOC~~) NOT AT ARMC
Chlamydia: NEGATIVE
NEISSERIA GONORRHEA: NEGATIVE

## 2017-08-30 ENCOUNTER — Inpatient Hospital Stay (HOSPITAL_COMMUNITY)
Admission: AD | Admit: 2017-08-30 | Discharge: 2017-08-30 | Disposition: A | Discharge status: Home / Self Care | Payer: Medicaid Other | Source: Ambulatory Visit | Attending: Obstetrics & Gynecology | Admitting: Obstetrics & Gynecology

## 2017-08-30 DIAGNOSIS — Z5321 Procedure and treatment not carried out due to patient leaving prior to being seen by health care provider: Secondary | ICD-10-CM | POA: Diagnosis not present

## 2017-08-30 LAB — HCG, QUANTITATIVE, PREGNANCY: hCG, Beta Chain, Quant, S: 3909 m[IU]/mL — ABNORMAL HIGH (ref <5)

## 2017-08-30 NOTE — MAU Note (Signed)
Patient not in lobby

## 2017-08-30 NOTE — MAU Note (Signed)
Was notified by admitting staff that she witnessed patient leave the lobby and get in a car.

## 2017-09-02 ENCOUNTER — Telehealth: Payer: Self-pay | Admitting: Student

## 2017-09-02 NOTE — Telephone Encounter (Signed)
Patient called requesting results of labwork from Saturday. Came for repeat BHCG but left after triage.  Verified by name & DOB. Notified of BHCG on Saturday. Patient to start prenatal care.

## 2017-11-25 NOTE — L&D Delivery Note (Signed)
       Delivery Note   Patricia Henson is a 27 y.o. [id number] at 106w4d Estimated Date of Delivery: 04/30/18  PRE-OPERATIVE DIAGNOSIS:  1) [redacted]w[redacted]d pregnancy.  2) Chronic hypertension  POST-OPERATIVE DIAGNOSIS:  1) [redacted]w[redacted]d pregnancy s/p Vaginal, Spontaneous   Delivery Type: Vaginal, Spontaneous    Delivery Anesthesia: Epidural   Labor Complications:      ESTIMATED BLOOD LOSS: 100  ml    FINDINGS:   1) Viable infant, Apgar scores of     at 1 minute and     at 5 minutes and a birthweight of    ounces.    2) Nuchal cord: No  SPECIMENS:   PLACENTA:   Appearance: Intact    Removal: Spontaneous      Disposition:     DISPOSITION:  Infant to left in stable condition in the delivery room, with L&D personnel and mother,  NARRATIVE SUMMARY: Labor course:  [name] is a [id number] at [redacted]w[redacted]d who presented with c/o contractions. She was followed at [facility] for chronic hypertension.  At the time of admission she was found to have labile blood pressures.  Her blood pressures were treated and she was begun on Cytotec for induction of delivery.  She received an epidural which failed.  A second epidural was placed but delivery ensued before it was working well. She pushed uncontrollably during the second stage of her labor. She went on to deliver a viable infant. The placenta delivered without problems and was noted to be complete. A perineal and vaginal examination was performed. Episiotomy/Lacerations:   None   Finis Bud, M.D. 04/13/2018 10:15 AM

## 2017-12-23 DIAGNOSIS — E6609 Other obesity due to excess calories: Secondary | ICD-10-CM | POA: Insufficient documentation

## 2017-12-23 DIAGNOSIS — O368320 Maternal care for abnormalities of the fetal heart rate or rhythm, second trimester, not applicable or unspecified: Secondary | ICD-10-CM | POA: Insufficient documentation

## 2017-12-24 LAB — OB RESULTS CONSOLE HEPATITIS B SURFACE ANTIGEN: HEP B S AG: NEGATIVE

## 2017-12-24 LAB — OB RESULTS CONSOLE HIV ANTIBODY (ROUTINE TESTING): HIV: NONREACTIVE

## 2017-12-24 LAB — OB RESULTS CONSOLE RPR: RPR: NONREACTIVE

## 2017-12-24 LAB — OB RESULTS CONSOLE RUBELLA ANTIBODY, IGM: RUBELLA: IMMUNE

## 2017-12-24 LAB — OB RESULTS CONSOLE GC/CHLAMYDIA: Gonorrhea: NEGATIVE

## 2017-12-24 LAB — OB RESULTS CONSOLE VARICELLA ZOSTER ANTIBODY, IGG: VARICELLA IGG: IMMUNE

## 2017-12-25 DIAGNOSIS — Z8759 Personal history of other complications of pregnancy, childbirth and the puerperium: Secondary | ICD-10-CM | POA: Insufficient documentation

## 2017-12-25 DIAGNOSIS — Z8632 Personal history of gestational diabetes: Secondary | ICD-10-CM | POA: Insufficient documentation

## 2017-12-25 DIAGNOSIS — Z87898 Personal history of other specified conditions: Secondary | ICD-10-CM | POA: Insufficient documentation

## 2017-12-25 DIAGNOSIS — F1291 Cannabis use, unspecified, in remission: Secondary | ICD-10-CM | POA: Insufficient documentation

## 2018-01-13 DIAGNOSIS — G4489 Other headache syndrome: Secondary | ICD-10-CM | POA: Insufficient documentation

## 2018-03-20 ENCOUNTER — Observation Stay
Admission: EM | Admit: 2018-03-20 | Discharge: 2018-03-20 | Disposition: A | Discharge status: Home / Self Care | Payer: Medicaid Other | Attending: Obstetrics and Gynecology | Admitting: Obstetrics and Gynecology

## 2018-03-20 DIAGNOSIS — R51 Headache: Secondary | ICD-10-CM | POA: Diagnosis not present

## 2018-03-20 DIAGNOSIS — Z3A00 Weeks of gestation of pregnancy not specified: Secondary | ICD-10-CM | POA: Diagnosis not present

## 2018-03-20 DIAGNOSIS — M549 Dorsalgia, unspecified: Secondary | ICD-10-CM | POA: Diagnosis not present

## 2018-03-20 DIAGNOSIS — Z3A37 37 weeks gestation of pregnancy: Secondary | ICD-10-CM

## 2018-03-20 DIAGNOSIS — I10 Essential (primary) hypertension: Secondary | ICD-10-CM | POA: Diagnosis present

## 2018-03-20 DIAGNOSIS — O26899 Other specified pregnancy related conditions, unspecified trimester: Secondary | ICD-10-CM | POA: Diagnosis not present

## 2018-03-20 DIAGNOSIS — O26893 Other specified pregnancy related conditions, third trimester: Secondary | ICD-10-CM

## 2018-03-20 LAB — CBC
HCT: 30.8 % — ABNORMAL LOW (ref 35.0–47.0)
Hemoglobin: 10.7 g/dL — ABNORMAL LOW (ref 12.0–16.0)
MCH: 27.7 pg (ref 26.0–34.0)
MCHC: 34.7 g/dL (ref 32.0–36.0)
MCV: 79.8 fL — AB (ref 80.0–100.0)
PLATELETS: 210 10*3/uL (ref 150–440)
RBC: 3.86 MIL/uL (ref 3.80–5.20)
RDW: 14.6 % — ABNORMAL HIGH (ref 11.5–14.5)
WBC: 9.7 10*3/uL (ref 3.6–11.0)

## 2018-03-20 LAB — URINALYSIS, ROUTINE W REFLEX MICROSCOPIC
BILIRUBIN URINE: NEGATIVE
Glucose, UA: NEGATIVE mg/dL
HGB URINE DIPSTICK: NEGATIVE
KETONES UR: 15 mg/dL — AB
Leukocytes, UA: NEGATIVE
NITRITE: NEGATIVE
Specific Gravity, Urine: >1.03 — ABNORMAL HIGH (ref 1.005–1.030)
pH: 7 (ref 5.0–8.0)

## 2018-03-20 LAB — COMPREHENSIVE METABOLIC PANEL
ALT: 10 U/L — AB (ref 14–54)
AST: 19 U/L (ref 15–41)
Albumin: 2.9 g/dL — ABNORMAL LOW (ref 3.5–5.0)
Alkaline Phosphatase: 93 U/L (ref 38–126)
Anion gap: 7 (ref 5–15)
BILIRUBIN TOTAL: 0.6 mg/dL (ref 0.3–1.2)
BUN: 11 mg/dL (ref 6–20)
CALCIUM: 8.4 mg/dL — AB (ref 8.9–10.3)
CHLORIDE: 104 mmol/L (ref 101–111)
CO2: 24 mmol/L (ref 22–32)
CREATININE: 0.58 mg/dL (ref 0.44–1.00)
Glucose, Bld: 83 mg/dL (ref 65–99)
Potassium: 3.7 mmol/L (ref 3.5–5.1)
Sodium: 135 mmol/L (ref 135–145)
TOTAL PROTEIN: 6.2 g/dL — AB (ref 6.5–8.1)

## 2018-03-20 LAB — URINALYSIS, MICROSCOPIC (REFLEX)

## 2018-03-20 LAB — PROTEIN / CREATININE RATIO, URINE
CREATININE, URINE: 275 mg/dL
Protein Creatinine Ratio: 0.07 mg/mg{Cre} (ref 0.00–0.15)
Total Protein, Urine: 20 mg/dL

## 2018-03-20 MED ORDER — ACETAMINOPHEN-CODEINE #3 300-30 MG PO TABS
2.0000 | ORAL_TABLET | Freq: Once | ORAL | Status: AC
  Administered 2018-03-20: 2 via ORAL
  Filled 2018-03-20: qty 2

## 2018-03-20 NOTE — Progress Notes (Signed)
Spoke with Dr. Amalia Hailey, MD. Orders for P/C ratio, CBC, CMP and Urinalysis. Requested MD to put in orders. MD refused, stating he was "driving in [place]." I agreed to put in orders for labs and requested MD put in orders for observation and additional orders as needed. MD stated "that would be okay."

## 2018-03-20 NOTE — Discharge Summary (Signed)
    L&D OB Triage Note  SUBJECTIVE Patricia Henson is a 27 y.o. [phone] female at Unknown, EDD Estimated Date of Delivery: None noted. who presented to triage with complaints of headache and back pain. H/O delivery at 37 weeks with previous pregnancy for pre-eclampsia.  OB History  Gravida Para Term Preterm AB Living  4 3 3  0 0 3  SAB TAB Ectopic Multiple Live Births  0 0 0 0 3    # Outcome Date GA Lbr Len/2nd Weight Sex Delivery Anes PTL Lv  4 Current           3 Term 03/26/16 [redacted]w[redacted]d / 00:27 6 lb 15.5 oz (3.16 kg) F Vag-Forceps EPI       Name: Solum,PENDINGBABY     Apgar1: 7  Apgar5: 9  2 Term           1 Term             Medications Prior to Admission  Medication Sig Dispense Refill Last Dose  . Prenatal Vit-Fe Fumarate-FA (PRENATAL MULTIVITAMIN) TABS tablet Take 1 tablet by mouth daily at 12 noon. 30 tablet 2 08/26/2017 at Unknown time     OBJECTIVE  Nursing Evaluation:   BP (!) 126/99   Temp 98.1 F (36.7 C) (Oral)   Resp 20   LMP  (LMP Unknown)    Findings:   NE - normal reflexes, no clonus.              Normal labs including ast and UPC ratio      NST was performed and will be reviewed by me. (Epic currently not working with [employer])  NST INTERPRETATION: Category I                 ASSESSMENT Impression:  1.  Pregnancy:  [id number] at Unknown , EDD Estimated Date of Delivery: None noted. 2.  NST:  Category I  3.  Likely chronic hypertension as labs not diagnostic for pre-e.  PLAN 1. Reassurance given 2. Discharge home with standard labor precautions given to return to L&D or call the office for problems. 3. Continue prenatal care.  Advised to call for an appointment at [facility] for Monday.

## 2018-03-20 NOTE — Progress Notes (Addendum)
G4P3 [redacted]w[redacted]d today present to BP d/t headache, spots in vision and low left-sided back pain that wraps around to the front. She rates her h/a 10/10 states that it began 3 days ago and is untouched by tylenol. Inisital BP 151/138, repeat 144/66. She has 1+ reflexes, no clonus, denies vaginal bleeding but reports large amounts of clear discharge that smells "unusual." Pt reports history of preeclampsia and gestational diabetes with previous pregnancies. Monitors applied and assessing.

## 2018-03-20 NOTE — Progress Notes (Signed)
EFM not transferring to [facility]. IT called.

## 2018-04-04 ENCOUNTER — Other Ambulatory Visit: Payer: Self-pay

## 2018-04-04 ENCOUNTER — Encounter: Payer: Self-pay | Admitting: *Deleted

## 2018-04-04 ENCOUNTER — Observation Stay
Admission: EM | Admit: 2018-04-04 | Discharge: 2018-04-04 | Disposition: A | Discharge status: Home / Self Care | Payer: Medicaid Other | Attending: Obstetrics and Gynecology | Admitting: Obstetrics and Gynecology

## 2018-04-04 DIAGNOSIS — Z3A37 37 weeks gestation of pregnancy: Secondary | ICD-10-CM | POA: Diagnosis not present

## 2018-04-04 DIAGNOSIS — O26893 Other specified pregnancy related conditions, third trimester: Secondary | ICD-10-CM | POA: Diagnosis present

## 2018-04-04 DIAGNOSIS — O163 Unspecified maternal hypertension, third trimester: Secondary | ICD-10-CM

## 2018-04-04 HISTORY — DX: Gestational (pregnancy-induced) hypertension without significant proteinuria, unspecified trimester: O13.9

## 2018-04-04 HISTORY — DX: Anemia, unspecified: D64.9

## 2018-04-04 LAB — URINALYSIS, ROUTINE W REFLEX MICROSCOPIC
Bilirubin Urine: NEGATIVE
GLUCOSE, UA: NEGATIVE mg/dL
HGB URINE DIPSTICK: NEGATIVE
KETONES UR: 20 mg/dL — AB
LEUKOCYTES UA: NEGATIVE
Nitrite: NEGATIVE
PROTEIN: NEGATIVE mg/dL
Specific Gravity, Urine: 1.02 (ref 1.005–1.030)
pH: 6 (ref 5.0–8.0)

## 2018-04-04 MED ORDER — SODIUM CHLORIDE 0.45 % IV SOLN
Freq: Once | INTRAVENOUS | Status: AC
  Administered 2018-04-04: 13:00:00 via INTRAVENOUS

## 2018-04-04 MED ORDER — ACETAMINOPHEN-CODEINE #3 300-30 MG PO TABS
2.0000 | ORAL_TABLET | Freq: Once | ORAL | Status: DC
  Filled 2018-04-04: qty 2

## 2018-04-04 NOTE — OB Triage Note (Signed)
C/o vaginal pain/pressure. Reports starting around 1700 yesterday. Reports pain/pressure is constant. Co lower middle back pain. Denies vaginal bleeding. Denies SROM. Reports good fetal movement. Patricia Henson

## 2018-04-05 ENCOUNTER — Observation Stay
Admission: EM | Admit: 2018-04-05 | Discharge: 2018-04-05 | Disposition: A | Discharge status: Home / Self Care | Payer: Medicaid Other | Attending: Obstetrics and Gynecology | Admitting: Obstetrics and Gynecology

## 2018-04-05 ENCOUNTER — Encounter: Payer: Self-pay | Admitting: *Deleted

## 2018-04-05 DIAGNOSIS — Z3A37 37 weeks gestation of pregnancy: Secondary | ICD-10-CM | POA: Diagnosis not present

## 2018-04-05 DIAGNOSIS — O471 False labor at or after 37 completed weeks of gestation: Secondary | ICD-10-CM | POA: Diagnosis not present

## 2018-04-05 DIAGNOSIS — O10913 Unspecified pre-existing hypertension complicating pregnancy, third trimester: Secondary | ICD-10-CM | POA: Diagnosis not present

## 2018-04-05 NOTE — OB Triage Note (Signed)
Patient given discharge instructions including follow up information, labor precautions, and fetal kick count instructions. Patient verbalized understanding. Discharged ambulatory in stable condition.

## 2018-04-05 NOTE — OB Triage Note (Addendum)
Patient comes in today complaining of contractions since around 3pm. She denies any bleeding or LOF.  Patient normally goes to [facility] for prenatal care. She has hypertension with this pregnancy and with her other 3 pregnancies.  Last intercourse 2 weeks ago. [phone]) Dr Amalia Hailey aware of patient in [place].  Contractions, vaginal exam and blood pressures.  Order to recheck in an hour and discharge home if she has not made any cervical change.

## 2018-04-05 NOTE — Discharge Instructions (Signed)
Follow up with Patricia Henson tomorrow, Monday May 13th. Return to Henson for increase in painful contractions, leaking of fluid, vaginal bleeding, or decreased fetal movement. May use tylenol as directed on the package as needed for pain.

## 2018-04-06 NOTE — Discharge Summary (Signed)
    L&D OB Triage Note  SUBJECTIVE Patricia Henson is a 27 y.o. [phone] female at [redacted]w[redacted]d, EDD Estimated Date of Delivery: 04/27/18 who presented to triage with complaints of contractions.   OB History  Gravida Para Term Preterm AB Living  4 3 3  0 0 3  SAB TAB Ectopic Multiple Live Births  0 0 0 0 3    # Outcome Date GA Lbr Len/2nd Weight Sex Delivery Anes PTL Lv  4 Current           3 Term 01/21/17 [redacted]w[redacted]d   M      2 Term 03/26/16 [redacted]w[redacted]d / 00:27 6 lb 15.5 oz (3.16 kg) F Vag-Forceps EPI       Name: Buller,PENDINGBABY     Apgar1: 7  Apgar5: 9  1 Term 12/07/13 [redacted]w[redacted]d  7 lb 2 oz (3.232 kg) F Vag-Forceps   LIV    No medications prior to admission.     OBJECTIVE  Nursing Evaluation:   BP (!) 158/86 (BP Location: Left Arm)   Pulse (!) 110   Temp 98.8 F (37.1 C) (Oral)   Resp 18   Ht 5\' 2"  (1.575 m)   Wt 211 lb (95.7 kg)   LMP  (LMP Unknown)   BMI 38.59 kg/m    Findings:   No cervical change.  Irreg contractions.  NST was performed and has been reviewed by me.  NST INTERPRETATION: Category I  Mode: External(EFM d/c'd for pt d/c home) Baseline Rate (A): 135 bpm Variability: Moderate Accelerations: 15 x 15 Decelerations: None     Contraction Frequency (min): 2-6  ASSESSMENT Impression:  1.  Pregnancy:  [id number] at [redacted]w[redacted]d , EDD Estimated Date of Delivery: 04/27/18 2.  NST:  Category I  3.  Continued Chronic HTN  PLAN 1. Reassurance given 2. Discharge home with standard labor precautions given to return to L&D or call the office for problems. 3. Continue routine prenatal care. 4.  Pt to return to [facility] per her choice for continued prenatal care and treatment of chronic HTN.

## 2018-04-06 NOTE — Discharge Summary (Signed)
    L&D OB Triage Note  SUBJECTIVE MITALI SHENEFIELD is a 27 y.o. [phone] female at [redacted]w[redacted]d, EDD Estimated Date of Delivery: 04/27/18 who presented to triage with complaints of contractions.   OB History  Gravida Para Term Preterm AB Living  4 3 3  0 0 3  SAB TAB Ectopic Multiple Live Births  0 0 0 0 3    # Outcome Date GA Lbr Len/2nd Weight Sex Delivery Anes PTL Lv  4 Current           3 Term 01/21/17 [redacted]w[redacted]d   M      2 Term 03/26/16 [redacted]w[redacted]d / 00:27 6 lb 15.5 oz (3.16 kg) F Vag-Forceps EPI       Name: Bilotta,PENDINGBABY     Apgar1: 7  Apgar5: 9  1 Term 12/07/13 [redacted]w[redacted]d  7 lb 2 oz (3.232 kg) F Vag-Forceps   LIV    No medications prior to admission.     OBJECTIVE  Nursing Evaluation:   BP (!) 151/88   Pulse 72   Resp 16   Ht 5\' 2"  (1.575 m)   Wt 211 lb (95.7 kg)   LMP  (LMP Unknown)   BMI 38.59 kg/m    Findings:   No cervical change - rare contractions  NST was performed and has been reviewed by me.  NST INTERPRETATION: Category I  Mode: External Baseline Rate (A): 135 bpm Variability: Moderate Accelerations: 15 x 15 Decelerations: None     Contraction Frequency (min): ui present  ASSESSMENT Impression:  1.  Pregnancy:  [id number] at [redacted]w[redacted]d , EDD Estimated Date of Delivery: 04/27/18 2.  NST:  Category I  3.  Chronic HTN - unchanged since last 2 visits.  PLAN 1. Reassurance given 2. Discharge home with standard labor precautions given to return to L&D or call the office for problems. 3. Continue routine prenatal care at [facility] per pt choice.  Needs to be seen Monday for HTN f/u.Marland Kitchen

## 2018-04-12 ENCOUNTER — Inpatient Hospital Stay
Admission: EM | Admit: 2018-04-12 | Discharge: 2018-04-15 | DRG: 807 | Disposition: A | Discharge status: Home / Self Care | Payer: Medicaid Other | Source: Ambulatory Visit | Attending: Obstetrics and Gynecology | Admitting: Obstetrics and Gynecology

## 2018-04-12 DIAGNOSIS — O9902 Anemia complicating childbirth: Secondary | ICD-10-CM | POA: Diagnosis present

## 2018-04-12 DIAGNOSIS — Z880 Allergy status to penicillin: Secondary | ICD-10-CM | POA: Diagnosis not present

## 2018-04-12 DIAGNOSIS — I10 Essential (primary) hypertension: Secondary | ICD-10-CM | POA: Diagnosis present

## 2018-04-12 DIAGNOSIS — Z3A37 37 weeks gestation of pregnancy: Secondary | ICD-10-CM | POA: Diagnosis not present

## 2018-04-12 DIAGNOSIS — O1002 Pre-existing essential hypertension complicating childbirth: Secondary | ICD-10-CM | POA: Diagnosis present

## 2018-04-12 DIAGNOSIS — O99210 Obesity complicating pregnancy, unspecified trimester: Secondary | ICD-10-CM | POA: Insufficient documentation

## 2018-04-12 DIAGNOSIS — R030 Elevated blood-pressure reading, without diagnosis of hypertension: Secondary | ICD-10-CM | POA: Diagnosis present

## 2018-04-12 DIAGNOSIS — O169 Unspecified maternal hypertension, unspecified trimester: Secondary | ICD-10-CM | POA: Diagnosis present

## 2018-04-12 DIAGNOSIS — E669 Obesity, unspecified: Secondary | ICD-10-CM | POA: Diagnosis present

## 2018-04-12 DIAGNOSIS — O99214 Obesity complicating childbirth: Secondary | ICD-10-CM | POA: Diagnosis present

## 2018-04-12 DIAGNOSIS — D649 Anemia, unspecified: Secondary | ICD-10-CM | POA: Diagnosis present

## 2018-04-12 LAB — TYPE AND SCREEN
ABO/RH(D): A POS
Antibody Screen: NEGATIVE

## 2018-04-12 LAB — CBC
HEMATOCRIT: 32.3 % — AB (ref 35.0–47.0)
HEMOGLOBIN: 11.5 g/dL — AB (ref 12.0–16.0)
MCH: 27.4 pg (ref 26.0–34.0)
MCHC: 35.4 g/dL (ref 32.0–36.0)
MCV: 77.4 fL — ABNORMAL LOW (ref 80.0–100.0)
Platelets: 245 10*3/uL (ref 150–440)
RBC: 4.17 MIL/uL (ref 3.80–5.20)
RDW: 14.7 % — ABNORMAL HIGH (ref 11.5–14.5)
WBC: 12.7 10*3/uL — ABNORMAL HIGH (ref 3.6–11.0)

## 2018-04-12 LAB — COMPREHENSIVE METABOLIC PANEL
ALBUMIN: 2.9 g/dL — AB (ref 3.5–5.0)
ALT: 10 U/L — AB (ref 14–54)
AST: 24 U/L (ref 15–41)
Alkaline Phosphatase: 124 U/L (ref 38–126)
Anion gap: 8 (ref 5–15)
BILIRUBIN TOTAL: 0.6 mg/dL (ref 0.3–1.2)
BUN: 10 mg/dL (ref 6–20)
CO2: 21 mmol/L — AB (ref 22–32)
Calcium: 9.1 mg/dL (ref 8.9–10.3)
Chloride: 107 mmol/L (ref 101–111)
Creatinine, Ser: 0.58 mg/dL (ref 0.44–1.00)
GFR calc Af Amer: >60 mL/min (ref >60)
GFR calc non Af Amer: >60 mL/min (ref >60)
GLUCOSE: 130 mg/dL — AB (ref 65–99)
Potassium: 3.4 mmol/L — ABNORMAL LOW (ref 3.5–5.1)
Sodium: 136 mmol/L (ref 135–145)
TOTAL PROTEIN: 6.7 g/dL (ref 6.5–8.1)

## 2018-04-12 LAB — RAPID HIV SCREEN (HIV 1/2 AB+AG)
HIV 1/2 Antibodies: NONREACTIVE
HIV-1 P24 Antigen - HIV24: NONREACTIVE

## 2018-04-12 LAB — PROTEIN / CREATININE RATIO, URINE
Creatinine, Urine: 176 mg/dL
Protein Creatinine Ratio: 0.09 mg/mg{Cre} (ref 0.00–0.15)
Total Protein, Urine: 15 mg/dL

## 2018-04-12 LAB — URIC ACID: Uric Acid, Serum: 4.4 mg/dL (ref 2.3–6.6)

## 2018-04-12 MED ORDER — OXYCODONE-ACETAMINOPHEN 5-325 MG PO TABS: 1.0000 | ORAL_TABLET | ORAL | Status: DC | PRN

## 2018-04-12 MED ORDER — BUTORPHANOL TARTRATE 1 MG/ML IJ SOLN
1.0000 mg | INTRAMUSCULAR | Status: DC | PRN
  Administered 2018-04-13 (×3): 1 mg via INTRAVENOUS
  Filled 2018-04-12 (×3): qty 1

## 2018-04-12 MED ORDER — SOD CITRATE-CITRIC ACID 500-334 MG/5ML PO SOLN: 30.0000 mL | ORAL | Status: DC | PRN

## 2018-04-12 MED ORDER — LACTATED RINGERS IV SOLN
INTRAVENOUS | Status: DC
  Administered 2018-04-12 – 2018-04-13 (×2): via INTRAVENOUS

## 2018-04-12 MED ORDER — OXYTOCIN BOLUS FROM INFUSION
500.0000 mL | Freq: Once | INTRAVENOUS | Status: AC
  Administered 2018-04-13: 500 mL via INTRAVENOUS

## 2018-04-12 MED ORDER — ACETAMINOPHEN 325 MG PO TABS: 650.0000 mg | ORAL_TABLET | ORAL | Status: DC | PRN

## 2018-04-12 MED ORDER — LACTATED RINGERS IV SOLN: 500.0000 mL | INTRAVENOUS | Status: DC | PRN

## 2018-04-12 MED ORDER — HYDRALAZINE HCL 20 MG/ML IJ SOLN: 10.0000 mg | Freq: Once | INTRAMUSCULAR | Status: DC | PRN

## 2018-04-12 MED ORDER — ONDANSETRON HCL 4 MG/2ML IJ SOLN: 4.0000 mg | Freq: Four times a day (QID) | INTRAMUSCULAR | Status: DC | PRN

## 2018-04-12 MED ORDER — MISOPROSTOL 200 MCG PO TABS
ORAL_TABLET | ORAL | Status: AC
  Administered 2018-04-13: 25 ug via VAGINAL
  Filled 2018-04-12: qty 4

## 2018-04-12 MED ORDER — AMMONIA AROMATIC IN INHA
RESPIRATORY_TRACT | Status: AC
  Filled 2018-04-12: qty 10

## 2018-04-12 MED ORDER — OXYTOCIN 40 UNITS IN LACTATED RINGERS INFUSION - SIMPLE MED
1.0000 m[IU]/min | INTRAVENOUS | Status: DC
  Filled 2018-04-12: qty 1000

## 2018-04-12 MED ORDER — LIDOCAINE HCL (PF) 1 % IJ SOLN: 30.0000 mL | INTRAMUSCULAR | Status: DC | PRN

## 2018-04-12 MED ORDER — LIDOCAINE HCL (PF) 1 % IJ SOLN
INTRAMUSCULAR | Status: AC
  Filled 2018-04-12: qty 30

## 2018-04-12 MED ORDER — OXYTOCIN 10 UNIT/ML IJ SOLN
INTRAMUSCULAR | Status: AC
  Filled 2018-04-12: qty 2

## 2018-04-12 MED ORDER — OXYTOCIN 40 UNITS IN LACTATED RINGERS INFUSION - SIMPLE MED
2.5000 [IU]/h | INTRAVENOUS | Status: DC
  Administered 2018-04-13: 2.5 [IU]/h via INTRAVENOUS

## 2018-04-12 MED ORDER — MISOPROSTOL 50MCG HALF TABLET
50.0000 ug | ORAL_TABLET | Freq: Once | ORAL | Status: AC
  Administered 2018-04-12: 50 ug via ORAL
  Filled 2018-04-12: qty 1

## 2018-04-12 MED ORDER — LABETALOL HCL 5 MG/ML IV SOLN
20.0000 mg | INTRAVENOUS | Status: DC | PRN
  Filled 2018-04-12: qty 16

## 2018-04-12 MED ORDER — OXYCODONE-ACETAMINOPHEN 5-325 MG PO TABS: 2.0000 | ORAL_TABLET | ORAL | Status: DC | PRN

## 2018-04-12 MED ORDER — TERBUTALINE SULFATE 1 MG/ML IJ SOLN: 0.2500 mg | Freq: Once | INTRAMUSCULAR | Status: DC | PRN

## 2018-04-12 MED ORDER — MISOPROSTOL 25 MCG QUARTER TABLET
50.0000 ug | ORAL_TABLET | ORAL | Status: DC | PRN
  Filled 2018-04-12: qty 1

## 2018-04-12 NOTE — OB Triage Note (Signed)
Patient here for elevated blood pressure states it was taken at home today by a family member and it was elevated. She doesn't feel well today " I feel light headed and just not right" her left side has been going numb off an on throughout the day. No lof or bleeding.

## 2018-04-12 NOTE — H&P (Addendum)
Obstetric History and Physical  Patricia Henson is an unassigned 27 y.o. [phone] with IUP at [redacted]w[redacted]d presenting for complaints of elevated blood pressure.  Patient has a h/o GHTN in current pregnancy (as well as prior pregnancies).  She currently receives care at [facility]. She reports that she went to [facility] yesterday for complaints of elevated blood pressures, but was discharged home. Today she presents with elevated BPs at home, general malaise, light-headedness, and stating that her left side has been going numb "off and on throughout the day". Patient states she has been having occasional contractions (but not painful), no vaginal bleeding, intact membranes, with active fetal movement.    Prenatal Course Source of Care: [facility] with onset of care at 21 weeks (transferred from [place], also lived briefly in [place])  Pregnancy complications or risks: Patient Active Problem List   Diagnosis Date Noted  . Hypertension affecting pregnancy 04/12/2018  . Obesity in pregnancy 04/12/2018  . Labor and delivery, indication for care 04/05/2018  . Other headache syndrome 01/13/2018  . History of gestational diabetes 12/25/2017  . History of marijuana use 12/25/2017  . History of pre-eclampsia 12/25/2017  . Maternal care for abnormalities of the fetal heart rate or rhythm, second trimester, not applicable or unspecified 12/23/2017     She plans to breastfeed She desires undecided method (considering Mirena IUD vs BTL) for postpartum contraception.   Prenatal labs and studies: ABO, Rh: --/--/PENDING (05/19 2118) Antibody: PENDING (05/19 2118) Rubella:  IMMUNE (01/30) Reviewed in Care Everywhere RPR:   NEGATIVE (01/30) Reviewed in Care Everywhere HBsAg:   NEGATIVE (01/30) Reviewed in Care Everywhere HIV: NON REACTIVE (05/19 2044)  GBS: Negative (04/04/2018) Reviewed in Care Everywhere 1 hr Glucola  normal Genetic screening declined Anatomy US normal   Past Medical History:  Diagnosis  Date  . Anemia   . Gestational diabetes    diet controlled  . Pregnancy induced hypertension     Past Surgical History:  Procedure Laterality Date  . CHOLECYSTECTOMY    . WISDOM TOOTH EXTRACTION      OB History  Gravida Para Term Preterm AB Living  4 3 3     3   SAB TAB Ectopic Multiple Live Births        0 3    # Outcome Date GA Lbr Len/2nd Weight Sex Delivery Anes PTL Lv  4 Current           3 Term 01/21/17 [redacted]w[redacted]d   M      2 Term 03/26/16 [redacted]w[redacted]d / 00:27 6 lb 15.5 oz (3.16 kg) F Vag-Forceps EPI    1 Term 12/07/13 [redacted]w[redacted]d  7 lb 2 oz (3.232 kg) F Vag-Forceps   LIV    Social History   Socioeconomic History  . Marital status: Single    Spouse name: Not on file  . Number of children: Not on file  . Years of education: Not on file  . Highest education level: Not on file  Occupational History  . Not on file  Social Needs  . Financial resource strain: Not on file  . Food insecurity:    Worry: Not on file    Inability: Not on file  . Transportation needs:    Medical: Not on file    Non-medical: Not on file  Tobacco Use  . Smoking status: Never Smoker  . Smokeless tobacco: Never Used  Substance and Sexual Activity  . Alcohol use: No  . Drug use: Not Currently    Types:  Marijuana  . Sexual activity: Yes    Birth control/protection: None  Lifestyle  . Physical activity:    Days per week: Not on file    Minutes per session: Not on file  . Stress: Not on file  Relationships  . Social connections:    Talks on phone: Not on file    Gets together: Not on file    Attends religious service: Not on file    Active member of club or organization: Not on file    Attends meetings of clubs or organizations: Not on file    Relationship status: Not on file  Other Topics Concern  . Not on file  Social History Narrative  . Not on file    Family History  Problem Relation Age of Onset  . Diabetes Mother     Medications Prior to Admission  Medication Sig Dispense Refill Last  Dose  . Prenatal Vit-Fe Fumarate-FA (PRENATAL MULTIVITAMIN) TABS tablet Take 1 tablet by mouth daily at 12 noon. 30 tablet 2 04/12/2018 at Unknown time    Allergies  Allergen Reactions  . Penicillins Rash    Has patient had a PCN reaction causing immediate rash, facial/tongue/throat swelling, SOB or lightheadedness with hypotension: Yes Has patient had a PCN reaction causing severe rash involving mucus membranes or skin necrosis: No Has patient had a PCN reaction that required hospitalization No patient not sure Has patient had a PCN reaction occurring within the last 10 years: No If all of the above answers are "NO", then may proceed with Cephalosporin use.    Review of Systems: Negative except for what is mentioned in HPI.  Physical Exam: Temp:  [98.6 F (37 C)] 98.6 F (37 C) (05/19 2130) Pulse Rate:  [79-112] 79 (05/19 2202) Resp:  [18-20] 20 (05/19 2147) BP: (117-166)/(66-111) 117/73 (05/19 2202)  CONSTITUTIONAL: Well-developed, well-nourished female in no acute distress.  HENT:  Normocephalic, atraumatic, External right and left ear normal. Oropharynx is clear and moist EYES: Conjunctivae and EOM are normal. Pupils are equal, round, and reactive to light. No scleral icterus.  NECK: Normal range of motion, supple, no masses SKIN: Skin is warm and dry. No rash noted. Not diaphoretic. No erythema. No pallor. NEUROLOGIC: Alert and oriented to person, place, and time. Normal reflexes, muscle tone coordination. No cranial nerve deficit noted. PSYCHIATRIC: Normal mood and affect. Normal behavior. Normal judgment and thought content. CARDIOVASCULAR: Normal heart rate noted, regular rhythm RESPIRATORY: Effort and breath sounds normal, no problems with respiration noted ABDOMEN: Soft, nontender, nondistended, gravid. MUSCULOSKELETAL: Normal range of motion. No edema and no tenderness. 2+ distal pulses.  Cervical Exam: Dilatation 2.5 cm   Effacement 50%   Station -2   Presentation:  cephalic FHT:  Baseline rate 135 bpm   Variability moderate  Accelerations present   Decelerations none Contractions: Rare, with uterine irritability   Pertinent Labs/Studies:   Results for orders placed or performed during the hospital encounter of 04/12/18 (from the past 24 hour(s))  CBC     Status: Abnormal   Collection Time: 04/12/18  8:44 PM  Result Value Ref Range   WBC 12.7 (H) 3.6 - 11.0 K/uL   RBC 4.17 3.80 - 5.20 MIL/uL   Hemoglobin 11.5 (L) 12.0 - 16.0 g/dL   HCT 32.3 (L) 35.0 - 47.0 %   MCV 77.4 (L) 80.0 - 100.0 fL   MCH 27.4 26.0 - 34.0 pg   MCHC 35.4 32.0 - 36.0 g/dL   RDW 14.7 (H) 11.5 - 14.5 %  Platelets 245 150 - 440 K/uL  Comprehensive metabolic panel     Status: Abnormal   Collection Time: 04/12/18  8:44 PM  Result Value Ref Range   Sodium 136 135 - 145 mmol/L   Potassium 3.4 (L) 3.5 - 5.1 mmol/L   Chloride 107 101 - 111 mmol/L   CO2 21 (L) 22 - 32 mmol/L   Glucose, Bld 130 (H) 65 - 99 mg/dL   BUN 10 6 - 20 mg/dL   Creatinine, Ser 0.58 0.44 - 1.00 mg/dL   Calcium 9.1 8.9 - 10.3 mg/dL   Total Protein 6.7 6.5 - 8.1 g/dL   Albumin 2.9 (L) 3.5 - 5.0 g/dL   AST 24 15 - 41 U/L   ALT 10 (L) 14 - 54 U/L   Alkaline Phosphatase 124 38 - 126 U/L   Total Bilirubin 0.6 0.3 - 1.2 mg/dL   GFR calc non Af Amer >60 >60 mL/min   GFR calc Af Amer >60 >60 mL/min   Anion gap 8 5 - 15  Uric acid     Status: None   Collection Time: 04/12/18  8:44 PM  Result Value Ref Range   Uric Acid, Serum 4.4 2.3 - 6.6 mg/dL  Protein / creatinine ratio, urine     Status: None   Collection Time: 04/12/18  8:44 PM  Result Value Ref Range   Creatinine, Urine 176 mg/dL   Total Protein, Urine 15 mg/dL   Protein Creatinine Ratio 0.09 0.00 - 0.15 mg/mg[Cre]  Rapid HIV screen (HIV 1/2 Ab+Ag) ([facility] Only)     Status: None   Collection Time: 04/12/18  8:44 PM  Result Value Ref Range   HIV-1 P24 Antigen - HIV24 NON REACTIVE NON REACTIVE   HIV 1/2 Antibodies NON REACTIVE NON REACTIVE    Interpretation (HIV Ag Ab)      A non reactive test result means that HIV 1 or HIV 2 antibodies and HIV 1 p24 antigen were not detected in the specimen.    Assessment : AUDELIA KNAPE is a 27 y.o. [phone] at [redacted]w[redacted]d being admitted for labor induction due to [facility] (with intermittent severe range blood pressures). H/o pre-eclampsia in prior pregnancy. Ruled out for pre-eclampsia currently. H/o marijuana use in pregnancy.   Plan: Labor: Induction as ordered as per protocol with Cytotec. Analgesia as needed. Ruled out for pre-eclampsia. Will treat with Labetalol for severe range BPs. UDS ordered for h/o MJ use.  FWB: Reassuring fetal heart tracing.  GBS negative Delivery plan: Hopeful for vaginal delivery    Rubie Maid, MD [facility]

## 2018-04-13 ENCOUNTER — Other Ambulatory Visit: Payer: Self-pay

## 2018-04-13 ENCOUNTER — Inpatient Hospital Stay: Payer: Medicaid Other | Admitting: Certified Registered Nurse Anesthetist

## 2018-04-13 DIAGNOSIS — O1002 Pre-existing essential hypertension complicating childbirth: Secondary | ICD-10-CM

## 2018-04-13 DIAGNOSIS — Z3A37 37 weeks gestation of pregnancy: Secondary | ICD-10-CM

## 2018-04-13 LAB — URINE DRUG SCREEN, QUALITATIVE (ARMC ONLY)
Amphetamines, Ur Screen: NOT DETECTED
BARBITURATES, UR SCREEN: NOT DETECTED
Benzodiazepine, Ur Scrn: NOT DETECTED
CANNABINOID 50 NG, UR ~~LOC~~: NOT DETECTED
COCAINE METABOLITE, UR ~~LOC~~: NOT DETECTED
MDMA (Ecstasy)Ur Screen: NOT DETECTED
Methadone Scn, Ur: NOT DETECTED
Opiate, Ur Screen: NOT DETECTED
Phencyclidine (PCP) Ur S: NOT DETECTED
TRICYCLIC, UR SCREEN: NOT DETECTED

## 2018-04-13 MED ORDER — IBUPROFEN 600 MG PO TABS
600.0000 mg | ORAL_TABLET | Freq: Four times a day (QID) | ORAL | Status: DC
  Administered 2018-04-13 – 2018-04-14 (×3): 600 mg via ORAL
  Filled 2018-04-13 (×3): qty 1

## 2018-04-13 MED ORDER — LIDOCAINE-EPINEPHRINE (PF) 1.5 %-1:200000 IJ SOLN
INTRAMUSCULAR | Status: DC | PRN
  Administered 2018-04-13: 3 mL via EPIDURAL

## 2018-04-13 MED ORDER — MISOPROSTOL 25 MCG QUARTER TABLET
25.0000 ug | ORAL_TABLET | ORAL | Status: DC
  Administered 2018-04-13: 25 ug via VAGINAL
  Filled 2018-04-13: qty 1

## 2018-04-13 MED ORDER — ACETAMINOPHEN 325 MG PO TABS
650.0000 mg | ORAL_TABLET | ORAL | Status: DC | PRN
  Administered 2018-04-13 – 2018-04-15 (×5): 650 mg via ORAL
  Filled 2018-04-13 (×5): qty 2

## 2018-04-13 MED ORDER — DIPHENHYDRAMINE HCL 25 MG PO CAPS: 25.0000 mg | ORAL_CAPSULE | Freq: Four times a day (QID) | ORAL | Status: DC | PRN

## 2018-04-13 MED ORDER — BENZOCAINE-MENTHOL 20-0.5 % EX AERO: 1.0000 "application " | INHALATION_SPRAY | CUTANEOUS | Status: DC | PRN

## 2018-04-13 MED ORDER — FENTANYL 2.5 MCG/ML W/ROPIVACAINE 0.15% IN NS 100 ML EPIDURAL (ARMC)
EPIDURAL | Status: DC | PRN
  Administered 2018-04-13: 12 mL/h via EPIDURAL

## 2018-04-13 MED ORDER — TETANUS-DIPHTH-ACELL PERTUSSIS 5-2.5-18.5 LF-MCG/0.5 IM SUSP: 0.5000 mL | Freq: Once | INTRAMUSCULAR | Status: DC

## 2018-04-13 MED ORDER — ZOLPIDEM TARTRATE 5 MG PO TABS: 5.0000 mg | ORAL_TABLET | Freq: Every evening | ORAL | Status: DC | PRN

## 2018-04-13 MED ORDER — PRENATAL MULTIVITAMIN CH
1.0000 | ORAL_TABLET | Freq: Every day | ORAL | Status: DC
  Administered 2018-04-14: 1 via ORAL
  Filled 2018-04-13: qty 1

## 2018-04-13 MED ORDER — FENTANYL 2.5 MCG/ML W/ROPIVACAINE 0.15% IN NS 100 ML EPIDURAL (ARMC)
EPIDURAL | Status: AC
  Filled 2018-04-13: qty 100

## 2018-04-13 MED ORDER — BUPIVACAINE HCL (PF) 0.25 % IJ SOLN
INTRAMUSCULAR | Status: DC | PRN
  Administered 2018-04-13 (×2): 4 mL via EPIDURAL

## 2018-04-13 MED ORDER — LIDOCAINE HCL (PF) 1 % IJ SOLN
INTRAMUSCULAR | Status: DC | PRN
  Administered 2018-04-13: 3 mL

## 2018-04-13 MED ORDER — OXYTOCIN 40 UNITS IN LACTATED RINGERS INFUSION - SIMPLE MED
2.5000 [IU]/h | INTRAVENOUS | Status: DC | PRN
  Filled 2018-04-13: qty 1000

## 2018-04-13 MED ORDER — SIMETHICONE 80 MG PO CHEW: 80.0000 mg | CHEWABLE_TABLET | ORAL | Status: DC | PRN

## 2018-04-13 MED ORDER — DOCUSATE SODIUM 100 MG PO CAPS
100.0000 mg | ORAL_CAPSULE | Freq: Two times a day (BID) | ORAL | Status: DC
  Administered 2018-04-13 – 2018-04-15 (×4): 100 mg via ORAL
  Filled 2018-04-13 (×4): qty 1

## 2018-04-13 NOTE — Progress Notes (Signed)
Pt asleep in bed. Infant asleep in bassinet.

## 2018-04-13 NOTE — Plan of Care (Signed)
  Problem: Education: Goal: Knowledge of Childbirth will improve Outcome: Progressing Goal: Ability to make informed decisions regarding treatment and plan of care will improve Outcome: Progressing Goal: Ability to state and carry out methods to decrease the pain will improve Outcome: Progressing   Problem: Coping: Goal: Ability to verbalize concerns and feelings about labor and delivery will improve Outcome: Progressing   Problem: Life Cycle: Goal: Ability to make normal progression through stages of labor will improve Outcome: Progressing Goal: Ability to effectively push during vaginal delivery will improve Outcome: Progressing   Problem: Role Relationship: Goal: Ability to demonstrate positive interaction with the child will improve Outcome: Progressing   Problem: Safety: Goal: Risk of complications during labor and delivery will decrease Outcome: Progressing   Problem: Pain Management: Goal: Relief or control of pain from uterine contractions will improve Outcome: Progressing   Problem: Education: Goal: Knowledge of General Education information will improve Outcome: Progressing   Problem: Health Behavior/Discharge Planning: Goal: Ability to manage health-related needs will improve Outcome: Progressing   Problem: Clinical Measurements: Goal: Ability to maintain clinical measurements within normal limits will improve Outcome: Progressing Goal: Will remain free from infection Outcome: Progressing Goal: Diagnostic test results will improve Outcome: Progressing Goal: Respiratory complications will improve Outcome: Progressing Goal: Cardiovascular complication will be avoided Outcome: Progressing   Problem: Activity: Goal: Risk for activity intolerance will decrease Outcome: Progressing   Problem: Nutrition: Goal: Adequate nutrition will be maintained Outcome: Progressing   Problem: Coping: Goal: Level of anxiety will decrease Outcome: Progressing    Problem: Elimination: Goal: Will not experience complications related to bowel motility Outcome: Progressing Goal: Will not experience complications related to urinary retention Outcome: Progressing   Problem: Pain Managment: Goal: General experience of comfort will improve Outcome: Progressing   Problem: Safety: Goal: Ability to remain free from injury will improve Outcome: Progressing   Problem: Skin Integrity: Goal: Risk for impaired skin integrity will decrease Outcome: Progressing

## 2018-04-13 NOTE — Progress Notes (Signed)
Pt up to rocking chair after walking to bathroom and back to room. Holding infant.

## 2018-04-13 NOTE — Anesthesia Procedure Notes (Addendum)
Epidural Patient location during procedure: OB Start time: 04/13/2018 7:18 AM End time: 04/13/2018 7:35 AM  Staffing Anesthesiologist: Gunnar Bulla, MD Resident/CRNA: Demetrius Charity, CRNA Performed: resident/CRNA   Preanesthetic Checklist Completed: patient identified, site marked, surgical consent, pre-op evaluation, timeout performed, IV checked, risks and benefits discussed and monitors and equipment checked  Epidural Patient position: sitting Prep: Betadine and ChloraPrep Patient monitoring: heart rate, continuous pulse ox and blood pressure Approach: midline Location: L4-L5 Injection technique: LOR saline  Needle:  Needle type: Tuohy  Needle gauge: 18 G Needle length: 9 cm and 9 Needle insertion depth: 5 cm Catheter type: closed end flexible Catheter size: 19 Gauge Catheter at skin depth: 10 cm Test dose: negative and 1.5% lidocaine with Epi 1:200 K  Assessment Sensory level: T12 Events: blood not aspirated, injection not painful, no injection resistance, negative IV test and no paresthesia  Additional Notes 1st attempt Pt. Evaluated and documentation done after procedure finished. Patient identified. Risks/Benefits/Options discussed with patient including but not limited to bleeding, infection, nerve damage, paralysis, failed block, incomplete pain control, headache, blood pressure changes, nausea, vomiting, reactions to medication both or allergic, itching and postpartum back pain. Confirmed with bedside nurse the patient's most recent platelet count. Confirmed with patient that they are not currently taking any anticoagulation, have any bleeding history or any family history of bleeding disorders. Patient expressed understanding and wished to proceed. All questions were answered. Sterile technique was used throughout the entire procedure. Please see nursing notes for vital signs. Test dose was given through epidural catheter and negative prior to continuing to dose  epidural or start infusion. Warning signs of high block given to the patient including shortness of breath, tingling/numbness in hands, complete motor block, or any concerning symptoms with instructions to call for help. Patient was given instructions on fall risk and not to get out of bed. All questions and concerns addressed with instructions to call with any issues or inadequate analgesia.   Patient tolerated the insertion well without immediate complications.Reason for block:procedure for pain

## 2018-04-13 NOTE — Progress Notes (Signed)
Intrapartum Progress Note  S: Patient complains of pain with contractions. Desires epidural.  Anesthesiology in room.   O: Temp:  [98 F (36.7 C)-98.6 F (37 C)] 98 F (36.7 C) (05/20 0302) Pulse Rate:  [65-112] 88 (05/20 0702) Resp:  [18-20] 18 (05/20 0302) BP: (110-166)/(59-111) 123/73 (05/20 0702)  Gen App: NAD, uncomfortable with contractions Abdomen: soft, gravid FHT: baseline 125 bpm.  Accels present.  Decels absent. moderate in degree variability.   Tocometer: contractions q 2-4 minutes Cervix: 4/70/-1 Extremities: Nontender, no edema.  Pitocin: None  Labs:   No new labs   Assessment:  1: SIUP at [redacted]w[redacted]d 2. GHTN  Plan:  1. Planning for epidural 2. Labetalol for severe range BPs 3. Can begin pitocin as needed after epidural placed for augmentation of labor   Rubie Maid, MD 04/13/2018 7:30 AM

## 2018-04-13 NOTE — Lactation Note (Signed)
This note was copied from a baby's chart. Lactation Consultation Note  Patient Name: Patricia Henson [id number] Date: 04/13/2018 Reason for consult: Initial assessment;1st time breastfeeding   Maternal Data Formula Feeding for Exclusion: No Does the patient have breastfeeding experience prior to this delivery?: Yes Breastfed first two children x 4ths, did not breast feed her last who is 26 yr old Feeding Feeding Type: Breast Fed Length of feed: 60 min  LATCH Score Latch: Grasps breast easily, tongue down, lips flanged, rhythmical sucking.  Audible Swallowing: Spontaneous and intermittent  Type of Nipple: Everted at rest and after stimulation  Comfort (Breast/Nipple): Soft / non-tender  Hold (Positioning): No assistance needed to correctly position infant at breast.  LATCH Score: 10  Interventions Interventions: Skin to skin;Adjust position  Lactation Tools Discussed/Used WIC Program: Yes   Consult Status Consult Status: PRN    Ferol Luz 04/13/2018, 11:37 AM

## 2018-04-13 NOTE — Anesthesia Preprocedure Evaluation (Signed)
Anesthesia Evaluation  Patient identified by MRN, date of birth, ID band Patient awake    Reviewed: Allergy & Precautions, NPO status , Patient's Chart, lab work & pertinent test results  Airway Mallampati: II   Neck ROM: Full    Dental   Pulmonary           Cardiovascular hypertension,      Neuro/Psych    GI/Hepatic GERD  ,  Endo/Other  diabetes  Renal/GU      Musculoskeletal   Abdominal   Peds  Hematology  (+) anemia ,   Anesthesia Other Findings   Reproductive/Obstetrics (+) Pregnancy                             Anesthesia Physical Anesthesia Plan  ASA: II  Anesthesia Plan: Epidural   Post-op Pain Management:    Induction:   PONV Risk Score and Plan:   Airway Management Planned:   Additional Equipment:   Intra-op Plan:   Post-operative Plan:   Informed Consent:   Plan Discussed with:   Anesthesia Plan Comments:         Anesthesia Quick Evaluation

## 2018-04-14 DIAGNOSIS — D649 Anemia, unspecified: Secondary | ICD-10-CM

## 2018-04-14 DIAGNOSIS — O9902 Anemia complicating childbirth: Secondary | ICD-10-CM

## 2018-04-14 LAB — RPR: RPR Ser Ql: NONREACTIVE

## 2018-04-14 MED ORDER — IBUPROFEN 600 MG PO TABS
600.0000 mg | ORAL_TABLET | Freq: Four times a day (QID) | ORAL | Status: DC
  Administered 2018-04-14 – 2018-04-15 (×4): 600 mg via ORAL
  Filled 2018-04-14 (×4): qty 1

## 2018-04-14 MED ORDER — IBUPROFEN 600 MG PO TABS
600.0000 mg | ORAL_TABLET | Freq: Four times a day (QID) | ORAL | Status: DC
  Administered 2018-04-14: 600 mg via ORAL
  Filled 2018-04-14: qty 1

## 2018-04-14 NOTE — Lactation Note (Signed)
This note was copied from a baby's chart. Lactation Consultation Note  Patient Name: Patricia Henson [id number] Date: 04/14/2018 Reason for consult: Follow-up assessment   Mom had been nursing her baby for 15 minutes per her report when I walked in. She denied problems other than baby has not stooled yet at 24 hours of age. I noted that while we were talking, baby was latched but not moving his mouth when she thought he was "nursing well". I turned down volume on tv, and showed her how to compress breast and stim baby a little to coax suckling which soon helped him develop a good rhythm. I helped her to listen for swallows which she claimed she had not heard before. I encouraged her to focus on the quality of each feeding and he should start to stool soon. Lots of swallows heard when she massaged/compressed breast. I gave her LC contact info and suggested I see at least another feeding or two today to follow assessment.    Maternal Data    Feeding Feeding Type: Breast Fed  LATCH Score Latch: Grasps breast easily, tongue down, lips flanged, rhythmical sucking.  Audible Swallowing: A few with stimulation  Type of Nipple: Everted at rest and after stimulation  Comfort (Breast/Nipple): Soft / non-tender  Hold (Positioning): No assistance needed to correctly position infant at breast.  LATCH Score: 9  Interventions Interventions: Breast feeding basics reviewed;Breast compression(taught Mom signs of milk transfer at breast vs sleeping at b)  Lactation Tools Discussed/Used     Consult Status Consult Status: PRN    Roque Cash 04/14/2018, 10:12 AM

## 2018-04-14 NOTE — Anesthesia Postprocedure Evaluation (Signed)
Anesthesia Post Note  Patient: Patricia Henson  Procedure(s) Performed: AN AD [place]  Patient location during evaluation: Mother Baby Anesthesia Type: Epidural Level of consciousness: awake and alert Pain management: pain level controlled Vital Signs Assessment: post-procedure vital signs reviewed and stable Respiratory status: spontaneous breathing, nonlabored ventilation and respiratory function stable Cardiovascular status: stable Postop Assessment: no headache, no backache and epidural receding Anesthetic complications: no     Last Vitals:  Vitals:   04/14/18 0023 04/14/18 0516  BP: 135/87 137/86  Pulse: 94 88  Resp: 18 20  Temp: 36.9 C 36.8 C  SpO2: 99% 98%    Last Pain:  Vitals:   04/14/18 0712  TempSrc:   PainSc: 6                  Alison Stalling

## 2018-04-14 NOTE — Plan of Care (Signed)
Vs stable; up ad lib; tolerating regular diet; taking motrin and occasional tylenol for pain control; breastfeeding and has needed no assistance

## 2018-04-14 NOTE — Progress Notes (Signed)
Patient ID: Patricia Henson, female   DOB: Dec 10, 1990, 27 y.o.   MRN: [id number]  Progress Note - Vaginal Delivery  Patricia Henson is a 27 y.o. [id number] now PP day 1 s/p Vaginal, Spontaneous .   Subjective:  The patient reports no complaints, up ad lib, voiding and tolerating PO  Objective:  Vital signs in last 24 hours: Temp:  [98.1 F (36.7 C)-98.4 F (36.9 C)] 98.1 F (36.7 C) (05/21 1145) Pulse Rate:  [78-100] 91 (05/21 1145) Resp:  [16-20] 18 (05/21 1145) BP: (122-137)/(71-87) 123/71 (05/21 1145) SpO2:  [98 %-99 %] 99 % (05/21 1145)  Physical Exam:  General: sleeping soundly Lochia: appropriate Uterine Fundus: firm     Data Review Recent Labs    04/12/18 2044  HGB 11.5*  HCT 32.3*    Assessment/Plan: Active Problems:   Hypertension   Labor and delivery, indication for care   Hypertension affecting pregnancy   Obesity in pregnancy   Plan for discharge tomorrow  -- Continue routine PP care.     Finis Bud, M.D. 04/14/2018 1:36 PM

## 2018-04-15 DIAGNOSIS — D649 Anemia, unspecified: Secondary | ICD-10-CM

## 2018-04-15 DIAGNOSIS — O9902 Anemia complicating childbirth: Secondary | ICD-10-CM

## 2018-04-15 NOTE — Progress Notes (Signed)
Pt discharged with infant.  Discharge instructions, prescriptions and follow up appointment given to and reviewed with pt. Pt verbalized understanding. Escorted out by auxillary. 

## 2018-04-15 NOTE — Discharge Summary (Signed)
                              Discharge Summary  Date of Admission: 04/12/2018  Date of Discharge: 04/15/2018  Admitting Diagnosis: Induction of labor and HTN at [redacted]w[redacted]d  Mode of Delivery: Vaginal delivery.                 Discharge Diagnosis: Chronic hypertension not well controlled   Intrapartum Procedures: Atificial rupture of membranes and Misoprostol   Post partum procedures:   Complications: none                      Discharge Day SOAP Note:  Progress Note - Vaginal Delivery  Patricia Henson is a 27 y.o. [id number] now PP day 2 s/p Vaginal, Spontaneous . Delivery was complicated by hypertension  Subjective  The patient has the following complaints: has no unusual complaints  Pain is controlled with current medications.   Patient is urinating without difficulty.  She is ambulating well.    Objective  Vital signs: BP 110/68 (BP Location: Left Arm)   Pulse 86   Temp 98.4 F (36.9 C) (Oral)   Resp 18   Ht 5\' 2"  (1.575 m)   Wt 211 lb (95.7 kg)   LMP  (LMP Unknown)   SpO2 99%   Breastfeeding? Unknown   BMI 38.59 kg/m   Physical Exam: Gen: NAD Fundus Fundal Tone: Firm  Lochia Amount: Small  Perineum Appearance: Intact     Data Review Labs: CBC Latest Ref Rng & Units 04/12/2018 03/20/2018 08/27/2017  WBC 3.6 - 11.0 K/uL 12.7(H) 9.7 12.0(H)  Hemoglobin 12.0 - 16.0 g/dL 11.5(L) 10.7(L) 12.6  Hematocrit 35.0 - 47.0 % 32.3(L) 30.8(L) 35.3(L)  Platelets 150 - 440 K/uL 245 210 274   A POS  Assessment/Plan  Active Problems:   Hypertension   Labor and delivery, indication for care   Hypertension affecting pregnancy   Obesity in pregnancy    Plan for discharge today.   Discharge Instructions: Per After Visit Summary. Activity: Advance as tolerated. Pelvic rest for 6 weeks.  Also refer to After Visit Summary Diet: Regular Medications:  Outpatient follow up:  Follow-up Information    Harlin Heys, MD Follow up in 6 week(s).   Specialty:  Obstetrics  and Gynecology Contact information: [address] [place] [address] [phone]          Postpartum contraception: Will discuss at first office visit post-partum  Discharged Condition: good  Discharged to: home  Newborn Data: Disposition:home with mother  Apgars: APGAR (1 MIN): 8   APGAR (5 MINS): 9   APGAR (10 MINS):    Baby Feeding: Breast    Finis Bud, M.D. 04/15/2018 10:20 AM

## 2018-05-05 ENCOUNTER — Encounter: Payer: Self-pay | Admitting: Obstetrics and Gynecology

## 2018-05-05 ENCOUNTER — Ambulatory Visit (INDEPENDENT_AMBULATORY_CARE_PROVIDER_SITE_OTHER): Payer: Medicaid Other | Admitting: Obstetrics and Gynecology

## 2018-05-05 VITALS — BP 122/83 | HR 80 | Ht 62.0 in | Wt 196.1 lb

## 2018-05-05 DIAGNOSIS — Z7689 Persons encountering health services in other specified circumstances: Secondary | ICD-10-CM

## 2018-05-05 NOTE — Progress Notes (Signed)
HPI:      [name] is a 27 y.o. [id number] who LMP was No LMP recorded (lmp unknown).  Subjective:   She presents today because she would like to return to work early.  She is approximately 3 weeks postpartum.  She works at [employer] and she says the job is not difficult.  She is no longer bleeding.  She has no pain.  She is breast-feeding full-time.  She plans to pump or supplement with formula.  She has not returned to sexual activity. Patient would like to discuss Mirena IUD at her postpartum visit.    Hx: The following portions of the patient's history were reviewed and updated as appropriate:             She  has a past medical history of Anemia, Gestational diabetes, and Pregnancy induced hypertension. She does not have any pertinent problems on file. She  has a past surgical history that includes Cholecystectomy and Wisdom tooth extraction. Her family history includes Diabetes in her mother. She  reports that she has never smoked. She has never used smokeless tobacco. She reports that she has current or past drug history. Drug: Marijuana. She reports that she does not drink alcohol. She has a current medication list which includes the following prescription(s): prenatal multivitamin. She is allergic to penicillins.       Review of Systems:  Review of Systems  Constitutional: Denied constitutional symptoms, night sweats, recent illness, fatigue, fever, insomnia and weight loss.  Eyes: Denied eye symptoms, eye pain, photophobia, vision change and visual disturbance.  Ears/Nose/Throat/Neck: Denied ear, nose, throat or neck symptoms, hearing loss, nasal discharge, sinus congestion and sore throat.  Cardiovascular: Denied cardiovascular symptoms, arrhythmia, chest pain/pressure, edema, exercise intolerance, orthopnea and palpitations.  Respiratory: Denied pulmonary symptoms, asthma, pleuritic pain, productive sputum, cough, dyspnea and wheezing.  Gastrointestinal: Denied,  gastro-esophageal reflux, melena, nausea and vomiting.  Genitourinary: Denied genitourinary symptoms including symptomatic vaginal discharge, pelvic relaxation issues, and urinary complaints.  Musculoskeletal: Denied musculoskeletal symptoms, stiffness, swelling, muscle weakness and myalgia.  Dermatologic: Denied dermatology symptoms, rash and scar.  Neurologic: Denied neurology symptoms, dizziness, headache, neck pain and syncope.  Psychiatric: Denied psychiatric symptoms, anxiety and depression.  Endocrine: Denied endocrine symptoms including hot flashes and night sweats.   Meds:   Current Outpatient Medications on File Prior to Visit  Medication Sig Dispense Refill  . Prenatal Vit-Fe Fumarate-FA (PRENATAL MULTIVITAMIN) TABS tablet Take 1 tablet by mouth daily at 12 noon. 30 tablet 2   No current facility-administered medications on file prior to visit.     Objective:     Vitals:   05/05/18 1540  BP: 122/83  Pulse: 80                Assessment:    [id number] Patient Active Problem List   Diagnosis Date Noted  . Hypertension affecting pregnancy 04/12/2018  . Obesity in pregnancy 04/12/2018  . Labor and delivery, indication for care 04/05/2018  . Indication for care in labor or delivery 04/04/2018  . Hypertension 03/20/2018  . Other headache syndrome 01/13/2018  . History of gestational diabetes 12/25/2017  . History of marijuana use 12/25/2017  . History of pre-eclampsia 12/25/2017  . Maternal care for abnormalities of the fetal heart rate or rhythm, second trimester, not applicable or unspecified 12/23/2017  . Obesity due to excess calories 12/23/2017     1. Return to work evaluation     Patient seems to be doing very well  and desires to return to work.  I see no medical restrictions for this.   Plan:            1.  Patient may return to work.  2.  Follow-up for postpartum visit 3 weeks. Orders No orders of the defined types were placed in this  encounter.   No orders of the defined types were placed in this encounter.     F/U  Return in about 3 weeks (around 05/26/2018). I spent 12 minutes involved in the care of this patient of which greater than 50% was spent discussing return to work, current medical condition, breast-feeding, future birth control.  All questions answered. Patricia Henson, M.D. 05/05/2018 4:16 PM

## 2018-05-26 ENCOUNTER — Encounter: Payer: Medicaid Other | Admitting: Obstetrics and Gynecology

## 2018-06-23 ENCOUNTER — Encounter: Payer: Medicaid Other | Admitting: Obstetrics and Gynecology

## 2018-10-10 ENCOUNTER — Other Ambulatory Visit: Payer: Self-pay

## 2018-10-10 ENCOUNTER — Emergency Department
Admission: EM | Admit: 2018-10-10 | Discharge: 2018-10-10 | Disposition: A | Discharge status: Home / Self Care | Payer: Medicaid Other | Attending: Emergency Medicine | Admitting: Emergency Medicine

## 2018-10-10 ENCOUNTER — Encounter: Payer: Self-pay | Admitting: Internal Medicine

## 2018-10-10 DIAGNOSIS — B9789 Other viral agents as the cause of diseases classified elsewhere: Secondary | ICD-10-CM | POA: Diagnosis not present

## 2018-10-10 DIAGNOSIS — R05 Cough: Secondary | ICD-10-CM | POA: Diagnosis present

## 2018-10-10 DIAGNOSIS — J069 Acute upper respiratory infection, unspecified: Secondary | ICD-10-CM | POA: Diagnosis not present

## 2018-10-10 DIAGNOSIS — J302 Other seasonal allergic rhinitis: Secondary | ICD-10-CM | POA: Diagnosis not present

## 2018-10-10 MED ORDER — GUAIFENESIN-CODEINE 100-10 MG/5ML PO SOLN: 5.0000 mL | Freq: Every evening | ORAL | 0 refills | Status: DC | PRN

## 2018-10-10 MED ORDER — CETIRIZINE HCL 10 MG PO TABS: 10.0000 mg | ORAL_TABLET | Freq: Every day | ORAL | 0 refills | Status: DC

## 2018-10-10 MED ORDER — FLUTICASONE PROPIONATE 50 MCG/ACT NA SUSP: 1.0000 | Freq: Every day | NASAL | 0 refills | Status: DC

## 2018-10-10 NOTE — Discharge Instructions (Addendum)
You have been diagnosed with a viral upper respiratory infection and allergies.  I have given you prescriptions for an antihistamine, steroid nasal spray and cough syrup.  Please use as directed.  Follow-up for fever, worsening cough or shortness of breath.

## 2018-10-10 NOTE — ED Triage Notes (Signed)
Cough since yesterday.   

## 2018-10-10 NOTE — ED Notes (Signed)
See triage note  Presents with cough and congestion   States she recently started a new job and has been exposed to strept and possible bronchitis  States she is not sure if she has been feverish but has had hot and cold chills  Cough is non prod this am  Afebrile on arrival

## 2018-10-10 NOTE — ED Provider Notes (Signed)
[facility] Emergency Department Provider Note ____________________________________________  Time seen: 29  I have reviewed the triage vital signs and the nursing notes.  HISTORY  Chief Complaint  Cough   HPI Patricia Henson is a 27 y.o. female presents to the ER today with complaint of hoarseness, cough and shortness of breath.  She reports this started yesterday.  The cough is nonproductive.  She is short of breath mostly at night when laying down.  She denies runny nose, ear pain, nasal congestion or sore throat.  She denies fever or chills but has had body aches.  She has tried an albuterol inhaler with some relief.  She has no history of allergies or asthma.  She has had sick contacts.  Past Medical History:  Diagnosis Date  . Anemia   . Gestational diabetes    diet controlled  . Pregnancy induced hypertension     Patient Active Problem List   Diagnosis Date Noted  . Hypertension affecting pregnancy 04/12/2018  . Obesity in pregnancy 04/12/2018  . Labor and delivery, indication for care 04/05/2018  . Indication for care in labor or delivery 04/04/2018  . Hypertension 03/20/2018  . Other headache syndrome 01/13/2018  . History of gestational diabetes 12/25/2017  . History of marijuana use 12/25/2017  . History of pre-eclampsia 12/25/2017  . Maternal care for abnormalities of the fetal heart rate or rhythm, second trimester, not applicable or unspecified 12/23/2017  . Obesity due to excess calories 12/23/2017    Past Surgical History:  Procedure Laterality Date  . CHOLECYSTECTOMY    . WISDOM TOOTH EXTRACTION      Prior to Admission medications   Medication Sig Start Date End Date Taking? Authorizing Provider  cetirizine (ZYRTEC) 10 MG tablet Take 1 tablet (10 mg total) by mouth daily. 10/10/18   Jearld Fenton, NP  fluticasone (FLONASE) 50 MCG/ACT nasal spray Place 1 spray into both nostrils daily. 10/10/18   Jearld Fenton, NP   guaiFENesin-codeine 100-10 MG/5ML syrup Take 5 mLs by mouth at bedtime as needed for cough. 10/10/18   Jearld Fenton, NP  Prenatal Vit-Fe Fumarate-FA (PRENATAL MULTIVITAMIN) TABS tablet Take 1 tablet by mouth daily at 12 noon. 01/23/17   Dalia Heading, CNM    Allergies Penicillins  Family History  Problem Relation Age of Onset  . Diabetes Mother     Social History Social History   Tobacco Use  . Smoking status: Never Smoker  . Smokeless tobacco: Never Used  Substance Use Topics  . Alcohol use: No  . Drug use: Not Currently    Types: Marijuana    Review of Systems  Constitutional: Positive for body aches.  Negative for fever. ENT: Negative for runny nose, ear pain or sore throat. Cardiovascular: Negative for chest pain. Respiratory: Positive for cough and shortness of breath.   Gastrointestinal: Negative for abdominal pain, vomiting and diarrhea. Skin: Negative for rash.  ____________________________________________  PHYSICAL EXAM:  VITAL SIGNS: ED Triage Vitals  Enc Vitals Group     BP 10/10/18 0836 127/77     Pulse Rate 10/10/18 0836 (!) 113     Resp 10/10/18 0836 20     Temp 10/10/18 0836 98.5 F (36.9 C)     Temp Source 10/10/18 0836 Oral     SpO2 10/10/18 0836 96 %     Weight 10/10/18 0838 180 lb (81.6 kg)     Height 10/10/18 0838 5\' 2"  (1.575 m)     Head Circumference --  Peak Flow --      Pain Score 10/10/18 0838 10     Pain Loc --      Pain Edu? --      Excl. in [place]? --     Constitutional: Alert and oriented. Well appearing and in no distress. Head: Normocephalic and atraumatic. Ears: Unable to visualize TM secondary to cerumen impaction bilaterally. Nose: [place] boggy and moist, turbinates swollen. Mouth/Throat: Mucous membranes are moist.  Throat erythematous without exudate.  + PND. Hematological/Lymphatic/Immunological: No cervical lymphadenopathy. Cardiovascular: Normal rate, regular rhythm. Respiratory: Normal respiratory effort. No  wheezes/rales/rhonchi. Neurologic:  Normal speech and language. No gross focal neurologic deficits are appreciated. Skin:  Skin is warm, dry and intact. No rash noted. ____________________________________________  INITIAL IMPRESSION / ASSESSMENT AND PLAN / ED COURSE  Allergic Rhinitis, Viral URI with Cough:  RX for Zyrtec 10 mg daily x 1 week RX for Flonase 1 spray each nostril daily x 1 week RX for Robitussin AC for cough ____________________________________________  FINAL CLINICAL IMPRESSION(S) / ED DIAGNOSES  Final diagnoses:  Viral URI with cough  Seasonal allergic rhinitis, unspecified trigger   Webb Silversmith, NP    Jearld Fenton, NP 10/10/18 0177    Orbie Pyo, MD 10/10/18 [phone]

## 2018-10-25 ENCOUNTER — Emergency Department: Payer: Medicaid Other

## 2018-10-25 ENCOUNTER — Other Ambulatory Visit: Payer: Self-pay

## 2018-10-25 ENCOUNTER — Encounter: Payer: Self-pay | Admitting: Emergency Medicine

## 2018-10-25 ENCOUNTER — Emergency Department
Admission: EM | Admit: 2018-10-25 | Discharge: 2018-10-25 | Disposition: A | Discharge status: Home / Self Care | Payer: Medicaid Other | Attending: Emergency Medicine | Admitting: Emergency Medicine

## 2018-10-25 DIAGNOSIS — J111 Influenza due to unidentified influenza virus with other respiratory manifestations: Secondary | ICD-10-CM | POA: Insufficient documentation

## 2018-10-25 DIAGNOSIS — I10 Essential (primary) hypertension: Secondary | ICD-10-CM | POA: Diagnosis not present

## 2018-10-25 DIAGNOSIS — Z79899 Other long term (current) drug therapy: Secondary | ICD-10-CM | POA: Diagnosis not present

## 2018-10-25 DIAGNOSIS — R52 Pain, unspecified: Secondary | ICD-10-CM | POA: Diagnosis present

## 2018-10-25 DIAGNOSIS — R69 Illness, unspecified: Secondary | ICD-10-CM

## 2018-10-25 LAB — COMPREHENSIVE METABOLIC PANEL
ALT: 19 U/L (ref 0–44)
ANION GAP: 8 (ref 5–15)
AST: 19 U/L (ref 15–41)
Albumin: 4.1 g/dL (ref 3.5–5.0)
Alkaline Phosphatase: 51 U/L (ref 38–126)
BILIRUBIN TOTAL: 0.7 mg/dL (ref 0.3–1.2)
BUN: 14 mg/dL (ref 6–20)
CO2: 25 mmol/L (ref 22–32)
Calcium: 8.9 mg/dL (ref 8.9–10.3)
Chloride: 102 mmol/L (ref 98–111)
Creatinine, Ser: 0.66 mg/dL (ref 0.44–1.00)
Glucose, Bld: 112 mg/dL — ABNORMAL HIGH (ref 70–99)
POTASSIUM: 3.4 mmol/L — AB (ref 3.5–5.1)
Sodium: 135 mmol/L (ref 135–145)
TOTAL PROTEIN: 7.2 g/dL (ref 6.5–8.1)

## 2018-10-25 LAB — PROTIME-INR
INR: 1.13
Prothrombin Time: 14.4 seconds (ref 11.4–15.2)

## 2018-10-25 LAB — URINALYSIS, COMPLETE (UACMP) WITH MICROSCOPIC
BACTERIA UA: NONE SEEN
Bilirubin Urine: NEGATIVE
Glucose, UA: NEGATIVE mg/dL
Hgb urine dipstick: NEGATIVE
Ketones, ur: 80 mg/dL — AB
Nitrite: NEGATIVE
PH: 6 (ref 5.0–8.0)
PROTEIN: 30 mg/dL — AB
Specific Gravity, Urine: 1.027 (ref 1.005–1.030)

## 2018-10-25 LAB — POCT PREGNANCY, URINE: Preg Test, Ur: NEGATIVE

## 2018-10-25 LAB — CBC WITH DIFFERENTIAL/PLATELET
ABS IMMATURE GRANULOCYTES: 0.12 10*3/uL — AB (ref 0.00–0.07)
BASOS PCT: 0 %
Basophils Absolute: 0.1 10*3/uL (ref 0.0–0.1)
EOS ABS: 0 10*3/uL (ref 0.0–0.5)
Eosinophils Relative: 0 %
HCT: 34.6 % — ABNORMAL LOW (ref 36.0–46.0)
Hemoglobin: 12 g/dL (ref 12.0–15.0)
IMMATURE GRANULOCYTES: 1 %
Lymphocytes Relative: 10 %
Lymphs Abs: 1.6 10*3/uL (ref 0.7–4.0)
MCH: 26.5 pg (ref 26.0–34.0)
MCHC: 34.7 g/dL (ref 30.0–36.0)
MCV: 76.4 fL — AB (ref 80.0–100.0)
MONOS PCT: 6 %
Monocytes Absolute: 1 10*3/uL (ref 0.1–1.0)
NEUTROS ABS: 13.7 10*3/uL — AB (ref 1.7–7.7)
NEUTROS PCT: 83 %
PLATELETS: 264 10*3/uL (ref 150–400)
RBC: 4.53 MIL/uL (ref 3.87–5.11)
RDW: 15 % (ref 11.5–15.5)
WBC: 16.4 10*3/uL — AB (ref 4.0–10.5)
nRBC: 0 % (ref 0.0–0.2)

## 2018-10-25 LAB — INFLUENZA PANEL BY PCR (TYPE A & B)
INFLAPCR: NEGATIVE
INFLBPCR: NEGATIVE

## 2018-10-25 LAB — CG4 I-STAT (LACTIC ACID): LACTIC ACID, VENOUS: 0.5 mmol/L (ref 0.5–1.9)

## 2018-10-25 MED ORDER — IBUPROFEN 600 MG PO TABS: 600.0000 mg | ORAL_TABLET | Freq: Three times a day (TID) | ORAL | 0 refills | Status: DC | PRN

## 2018-10-25 MED ORDER — IBUPROFEN 800 MG PO TABS
800.0000 mg | ORAL_TABLET | Freq: Once | ORAL | Status: AC
  Administered 2018-10-25: 800 mg via ORAL
  Filled 2018-10-25: qty 1

## 2018-10-25 MED ORDER — SODIUM CHLORIDE 0.9 % IV BOLUS
1000.0000 mL | Freq: Once | INTRAVENOUS | Status: AC
  Administered 2018-10-25: 1000 mL via INTRAVENOUS

## 2018-10-25 MED ORDER — ACETAMINOPHEN 500 MG PO TABS
1000.0000 mg | ORAL_TABLET | Freq: Once | ORAL | Status: AC
  Administered 2018-10-25: 1000 mg via ORAL
  Filled 2018-10-25: qty 2

## 2018-10-25 NOTE — ED Notes (Signed)
MD Beather Arbour made aware of pt's VS.

## 2018-10-25 NOTE — Discharge Instructions (Signed)
Fortunately today your blood work, your chest x-ray, and your flu test were very reassuring.  Please take 600 mg of ibuprofen and 1000 mg of Tylenol every 6 hours as needed for fevers, chills, and muscle aches and make sure you remain well-hydrated.  I expect you to be sick for another 4 or 5 days.  You are contagious to make sure everyone at home washes their hands several times a day.  Return to the emergency department for any concerns such as if you cannot eat or drink, if your pain worsens, or for any other issues whatsoever.  It was a pleasure to take care of you today, and thank you for coming to our emergency department.  If you have any questions or concerns before leaving please ask the nurse to grab me and I'm more than happy to go through your aftercare instructions again.  If you were prescribed any opioid pain medication today such as Norco, Vicodin, Percocet, morphine, hydrocodone, or oxycodone please make sure you do not drive when you are taking this medication as it can alter your ability to drive safely.  If you have any concerns once you are home that you are not improving or are in fact getting worse before you can make it to your follow-up appointment, please do not hesitate to call [phone] and come back for further evaluation.  Darel Hong, MD  Results for orders placed or performed during the hospital encounter of 10/25/18  Comprehensive metabolic panel  Result Value Ref Range   Sodium 135 135 - 145 mmol/L   Potassium 3.4 (L) 3.5 - 5.1 mmol/L   Chloride 102 98 - 111 mmol/L   CO2 25 22 - 32 mmol/L   Glucose, Bld 112 (H) 70 - 99 mg/dL   BUN 14 6 - 20 mg/dL   Creatinine, Ser 0.66 0.44 - 1.00 mg/dL   Calcium 8.9 8.9 - 10.3 mg/dL   Total Protein 7.2 6.5 - 8.1 g/dL   Albumin 4.1 3.5 - 5.0 g/dL   AST 19 15 - 41 U/L   ALT 19 0 - 44 U/L   Alkaline Phosphatase 51 38 - 126 U/L   Total Bilirubin 0.7 0.3 - 1.2 mg/dL   GFR calc non Af Amer >60 >60 mL/min   GFR calc Af Amer >60  >60 mL/min   Anion gap 8 5 - 15  CBC with Differential  Result Value Ref Range   WBC 16.4 (H) 4.0 - 10.5 K/uL   RBC 4.53 3.87 - 5.11 MIL/uL   Hemoglobin 12.0 12.0 - 15.0 g/dL   HCT 34.6 (L) 36.0 - 46.0 %   MCV 76.4 (L) 80.0 - 100.0 fL   MCH 26.5 26.0 - 34.0 pg   MCHC 34.7 30.0 - 36.0 g/dL   RDW 15.0 11.5 - 15.5 %   Platelets 264 150 - 400 K/uL   nRBC 0.0 0.0 - 0.2 %   Neutrophils Relative % 83 %   Neutro Abs 13.7 (H) 1.7 - 7.7 K/uL   Lymphocytes Relative 10 %   Lymphs Abs 1.6 0.7 - 4.0 K/uL   Monocytes Relative 6 %   Monocytes Absolute 1.0 0.1 - 1.0 K/uL   Eosinophils Relative 0 %   Eosinophils Absolute 0.0 0.0 - 0.5 K/uL   Basophils Relative 0 %   Basophils Absolute 0.1 0.0 - 0.1 K/uL   Immature Granulocytes 1 %   Abs Immature Granulocytes 0.12 (H) 0.00 - 0.07 K/uL  Protime-INR  Result Value Ref Range  Prothrombin Time 14.4 11.4 - 15.2 seconds   INR 1.13   Urinalysis, Complete w Microscopic  Result Value Ref Range   Color, Urine YELLOW (A) YELLOW   APPearance HAZY (A) CLEAR   Specific Gravity, Urine 1.027 1.005 - 1.030   pH 6.0 5.0 - 8.0   Glucose, UA NEGATIVE NEGATIVE mg/dL   Hgb urine dipstick NEGATIVE NEGATIVE   Bilirubin Urine NEGATIVE NEGATIVE   Ketones, ur 80 (A) NEGATIVE mg/dL   Protein, ur 30 (A) NEGATIVE mg/dL   Nitrite NEGATIVE NEGATIVE   Leukocytes, UA TRACE (A) NEGATIVE   RBC / HPF 0-5 0 - 5 RBC/hpf   WBC, UA 6-10 0 - 5 WBC/hpf   Bacteria, UA NONE SEEN NONE SEEN   Squamous Epithelial / LPF 0-5 0 - 5   Mucus PRESENT   Influenza panel by PCR (type A & B)  Result Value Ref Range   Influenza A By PCR NEGATIVE NEGATIVE   Influenza B By PCR NEGATIVE NEGATIVE  CG4 I-STAT (Lactic acid)  Result Value Ref Range   Lactic Acid, Venous 0.50 0.5 - 1.9 mmol/L  Pregnancy, urine POC  Result Value Ref Range   Preg Test, Ur NEGATIVE NEGATIVE   Dg Chest 2 View  Result Date: 10/25/2018 CLINICAL DATA:  Generalized body aches and fever today. EXAM: CHEST - 2 VIEW  COMPARISON:  08/12/2011 FINDINGS: The heart size and mediastinal contours are within normal limits. Both lungs are clear. No pleural effusion or pneumothorax. The visualized skeletal structures are unremarkable. IMPRESSION: Normal chest radiographs. Electronically Signed   By: Lajean Manes M.D.   On: 10/25/2018 01:43

## 2018-10-25 NOTE — ED Triage Notes (Signed)
Pt arrives ambulatory to triage with c/o of generalized body aches and fever today. Pt is febrile and tachycardic at this time in triage.

## 2018-10-25 NOTE — ED Notes (Signed)
Pt reports already taking tylenol prior to arrival.

## 2018-10-25 NOTE — ED Provider Notes (Signed)
[facility] Emergency Department Provider Note  ____________________________________________   First MD Initiated Contact with Patient 10/25/18 0320     (approximate)  I have reviewed the triage vital signs and the nursing notes.   HISTORY  Chief Complaint Generalized Body Aches   HPI Patricia Henson is a 27 y.o. female presents to the emergency department with 24 hours of generalized body aches, malaise, and fever at home.  She is here some dry cough.  No dysuria frequency or hesitancy.  No back pain.  Symptoms came on insidiously have been slowly progressive and are now moderate in severity.  No sick contacts.  Does report mild bifrontal throbbing headache similar to previous headaches.  No neck pain.  No double vision or blurred vision.  The symptoms are improved with Tylenol and nothing seems to make them worse.    Past Medical History:  Diagnosis Date  . Anemia   . Gestational diabetes    diet controlled  . Pregnancy induced hypertension     Patient Active Problem List   Diagnosis Date Noted  . Hypertension affecting pregnancy 04/12/2018  . Obesity in pregnancy 04/12/2018  . Labor and delivery, indication for care 04/05/2018  . Indication for care in labor or delivery 04/04/2018  . Hypertension 03/20/2018  . Other headache syndrome 01/13/2018  . History of gestational diabetes 12/25/2017  . History of marijuana use 12/25/2017  . History of pre-eclampsia 12/25/2017  . Maternal care for abnormalities of the fetal heart rate or rhythm, second trimester, not applicable or unspecified 12/23/2017  . Obesity due to excess calories 12/23/2017    Past Surgical History:  Procedure Laterality Date  . CHOLECYSTECTOMY    . WISDOM TOOTH EXTRACTION      Prior to Admission medications   Medication Sig Start Date End Date Taking? Authorizing Provider  cetirizine (ZYRTEC) 10 MG tablet Take 1 tablet (10 mg total) by mouth daily. 10/10/18   Jearld Fenton, NP  fluticasone (FLONASE) 50 MCG/ACT nasal spray Place 1 spray into both nostrils daily. 10/10/18   Jearld Fenton, NP  guaiFENesin-codeine 100-10 MG/5ML syrup Take 5 mLs by mouth at bedtime as needed for cough. 10/10/18   Jearld Fenton, NP  ibuprofen (ADVIL,MOTRIN) 600 MG tablet Take 1 tablet (600 mg total) by mouth every 8 (eight) hours as needed. 10/25/18   Darel Hong, MD  Prenatal Vit-Fe Fumarate-FA (PRENATAL MULTIVITAMIN) TABS tablet Take 1 tablet by mouth daily at 12 noon. 01/23/17   Dalia Heading, CNM    Allergies Penicillins  Family History  Problem Relation Age of Onset  . Diabetes Mother     Social History Social History   Tobacco Use  . Smoking status: Never Smoker  . Smokeless tobacco: Never Used  Substance Use Topics  . Alcohol use: No  . Drug use: Not Currently    Types: Marijuana    Review of Systems Constitutional: Positive for fevers and chills Eyes: No visual changes. ENT: No sore throat. Cardiovascular: Denies chest pain. Respiratory: Positive for cough Gastrointestinal: No abdominal pain.  Positive for nausea, no vomiting.  No diarrhea.  No constipation. Genitourinary: Negative for dysuria. Musculoskeletal: Negative for back pain. Skin: Negative for rash. Neurological: Positive for headache   ____________________________________________   PHYSICAL EXAM:  VITAL SIGNS: ED Triage Vitals  Enc Vitals Group     BP 10/25/18 0101 133/78     Pulse Rate 10/25/18 0101 (!) 126     Resp 10/25/18 0101 18  Temp 10/25/18 0101 (!) 102.8 F (39.3 C)     Temp Source 10/25/18 0101 Oral     SpO2 10/25/18 0101 97 %     Weight 10/25/18 0101 180 lb (81.6 kg)     Height 10/25/18 0101 5\' 2"  (1.575 m)     Head Circumference --      Peak Flow --      Pain Score 10/25/18 0105 10     Pain Loc --      Pain Edu? --      Excl. in [place]? --     Constitutional: Alert and oriented x4 appears somewhat uncomfortable although nontoxic no diaphoresis and  speaks full clear sentences Eyes: PERRL EOMI. midrange and brisk Head: Atraumatic. Nose: Mild congestion. Mouth/Throat: No trismus Neck: No stridor.  No meningismus Cardiovascular: Normal rate, regular rhythm. Grossly normal heart sounds.  Good peripheral circulation. Respiratory: Normal respiratory effort.  No retractions. Lungs CTAB and moving good air Gastrointestinal: Soft nontender no peritonitis.  No costovertebral tenderness Musculoskeletal: No lower extremity edema   Neurologic:  Normal speech and language. No gross focal neurologic deficits are appreciated. Skin:  Skin is warm, dry and intact. No rash noted. Psychiatric: Mood and affect are normal. Speech and behavior are normal.    ____________________________________________   DIFFERENTIAL includes but not limited to  Influenza, influenza-like illness, upper respiratory tract infection, pyelonephritis ____________________________________________   LABS (all labs ordered are listed, but only abnormal results are displayed)  Labs Reviewed  COMPREHENSIVE METABOLIC PANEL - Abnormal; Notable for the following components:      Result Value   Potassium 3.4 (*)    Glucose, Bld 112 (*)    All other components within normal limits  CBC WITH DIFFERENTIAL/PLATELET - Abnormal; Notable for the following components:   WBC 16.4 (*)    HCT 34.6 (*)    MCV 76.4 (*)    Neutro Abs 13.7 (*)    Abs Immature Granulocytes 0.12 (*)    All other components within normal limits  URINALYSIS, COMPLETE (UACMP) WITH MICROSCOPIC - Abnormal; Notable for the following components:   Color, Urine YELLOW (*)    APPearance HAZY (*)    Ketones, ur 80 (*)    Protein, ur 30 (*)    Leukocytes, UA TRACE (*)    All other components within normal limits  CULTURE, BLOOD (ROUTINE X 2)  CULTURE, BLOOD (ROUTINE X 2)  PROTIME-INR  INFLUENZA PANEL BY PCR (TYPE A & B)  I-STAT CG4 LACTIC ACID, ED  POC URINE PREG, ED  CG4 I-STAT (LACTIC ACID)  POCT  PREGNANCY, URINE    Lab work reviewed by me with ketosis suggestive of starvation and dehydration.  Elevated white count is nonspecific __________________________________________  EKG   ____________________________________________  RADIOLOGY  6 reviewed by me with no acute disease ____________________________________________   PROCEDURES  Procedure(s) performed: no  Procedures  Critical Care performed: no  ____________________________________________   INITIAL IMPRESSION / ASSESSMENT AND PLAN / ED COURSE  Pertinent labs & imaging results that were available during my care of the patient were reviewed by me and considered in my medical decision making (see chart for details).   As part of my medical decision making, I reviewed the following data within the [place] History obtained from family if available, nursing notes, old chart and ekg, as well as notes from prior ED visits.  By the time I saw the patient she had already gotten more ibuprofen and Tylenol at home and  defervesced.  She arrived tachycardic with a fever nearly 103 degrees along with myalgias and generalized malaise.  Differential is broad but includes season most likely would be influenza, viral syndrome, or possibly bacteremia.  Cultures have been sent and her influenza testing is negative.  Chest x-ray is reassuring.  No evidence of pyelonephritis.  I had a lengthy discussion with the patient regarding the diagnostic uncertainty but the likelihood is she would not benefit from antibiotics so I will discharge her home with supportive care.  Strict return precautions were given.      ____________________________________________   FINAL CLINICAL IMPRESSION(S) / ED DIAGNOSES  Final diagnoses:  Influenza-like illness      NEW MEDICATIONS STARTED DURING THIS VISIT:  Discharge Medication List as of 10/25/2018  3:49 AM    START taking these medications   Details  ibuprofen  (ADVIL,MOTRIN) 600 MG tablet Take 1 tablet (600 mg total) by mouth every 8 (eight) hours as needed., Starting Sun 10/25/2018, Print         Note:  This document was prepared using Dragon voice recognition software and may include unintentional dictation errors.     Darel Hong, MD 10/27/18 2228

## 2018-10-25 NOTE — ED Notes (Signed)
Patient asking about wait time. Patient given update on wait time. Patient verbalizes understanding.  

## 2018-10-30 LAB — CULTURE, BLOOD (ROUTINE X 2)
CULTURE: NO GROWTH
Special Requests: ADEQUATE

## 2019-02-19 ENCOUNTER — Ambulatory Visit: Payer: Medicaid Other | Admitting: Obstetrics and Gynecology

## 2019-02-19 ENCOUNTER — Encounter: Payer: Medicaid Other | Admitting: Obstetrics and Gynecology

## 2019-04-16 ENCOUNTER — Ambulatory Visit: Payer: Medicaid Other | Admitting: Obstetrics and Gynecology

## 2019-04-30 ENCOUNTER — Ambulatory Visit: Payer: Medicaid Other | Admitting: Obstetrics and Gynecology

## 2019-05-03 ENCOUNTER — Telehealth: Payer: Self-pay

## 2019-05-03 NOTE — Telephone Encounter (Signed)
Pt is requesting tb test to be ordered.  Pls advise.

## 2019-05-03 NOTE — Telephone Encounter (Signed)
Tried to call patient back. Line was busy

## 2019-05-06 NOTE — Telephone Encounter (Signed)
LM for patient to return call.

## 2019-05-14 ENCOUNTER — Ambulatory Visit: Payer: Medicaid Other | Admitting: Obstetrics and Gynecology

## 2019-08-31 ENCOUNTER — Ambulatory Visit: Payer: Self-pay

## 2019-10-05 ENCOUNTER — Emergency Department: Payer: Medicaid Other

## 2019-10-05 ENCOUNTER — Emergency Department
Admission: EM | Admit: 2019-10-05 | Discharge: 2019-10-06 | Disposition: A | Discharge status: Home / Self Care | Payer: Medicaid Other | Attending: Student | Admitting: Student

## 2019-10-05 ENCOUNTER — Other Ambulatory Visit: Payer: Self-pay

## 2019-10-05 DIAGNOSIS — N390 Urinary tract infection, site not specified: Secondary | ICD-10-CM | POA: Insufficient documentation

## 2019-10-05 DIAGNOSIS — R300 Dysuria: Secondary | ICD-10-CM | POA: Diagnosis present

## 2019-10-05 DIAGNOSIS — Z3202 Encounter for pregnancy test, result negative: Secondary | ICD-10-CM | POA: Diagnosis not present

## 2019-10-05 DIAGNOSIS — Z79899 Other long term (current) drug therapy: Secondary | ICD-10-CM | POA: Insufficient documentation

## 2019-10-05 DIAGNOSIS — I10 Essential (primary) hypertension: Secondary | ICD-10-CM | POA: Diagnosis not present

## 2019-10-05 DIAGNOSIS — R1030 Lower abdominal pain, unspecified: Secondary | ICD-10-CM | POA: Insufficient documentation

## 2019-10-05 LAB — URINALYSIS, COMPLETE (UACMP) WITH MICROSCOPIC
Bilirubin Urine: NEGATIVE
Glucose, UA: NEGATIVE mg/dL
Ketones, ur: 20 mg/dL — AB
Nitrite: NEGATIVE
Protein, ur: 100 mg/dL — AB
RBC / HPF: >50 RBC/hpf — ABNORMAL HIGH (ref 0–5)
Specific Gravity, Urine: 1.026 (ref 1.005–1.030)
WBC, UA: >50 WBC/hpf — ABNORMAL HIGH (ref 0–5)
pH: 5 (ref 5.0–8.0)

## 2019-10-05 LAB — COMPREHENSIVE METABOLIC PANEL
ALT: 16 U/L (ref 0–44)
AST: 19 U/L (ref 15–41)
Albumin: 4.1 g/dL (ref 3.5–5.0)
Alkaline Phosphatase: 63 U/L (ref 38–126)
Anion gap: 8 (ref 5–15)
BUN: 16 mg/dL (ref 6–20)
CO2: 26 mmol/L (ref 22–32)
Calcium: 9.7 mg/dL (ref 8.9–10.3)
Chloride: 103 mmol/L (ref 98–111)
Creatinine, Ser: 0.76 mg/dL (ref 0.44–1.00)
GFR calc Af Amer: >60 mL/min (ref >60)
GFR calc non Af Amer: >60 mL/min (ref >60)
Glucose, Bld: 101 mg/dL — ABNORMAL HIGH (ref 70–99)
Potassium: 3.8 mmol/L (ref 3.5–5.1)
Sodium: 137 mmol/L (ref 135–145)
Total Bilirubin: 0.6 mg/dL (ref 0.3–1.2)
Total Protein: 7.1 g/dL (ref 6.5–8.1)

## 2019-10-05 LAB — CBC
HCT: 35.5 % — ABNORMAL LOW (ref 36.0–46.0)
Hemoglobin: 12.7 g/dL (ref 12.0–15.0)
MCH: 27.2 pg (ref 26.0–34.0)
MCHC: 35.8 g/dL (ref 30.0–36.0)
MCV: 76 fL — ABNORMAL LOW (ref 80.0–100.0)
Platelets: 274 10*3/uL (ref 150–400)
RBC: 4.67 MIL/uL (ref 3.87–5.11)
RDW: 13.6 % (ref 11.5–15.5)
WBC: 20 10*3/uL — ABNORMAL HIGH (ref 4.0–10.5)
nRBC: 0 % (ref 0.0–0.2)

## 2019-10-05 LAB — POCT PREGNANCY, URINE: Preg Test, Ur: NEGATIVE

## 2019-10-05 LAB — LIPASE, BLOOD: Lipase: 14 U/L (ref 11–51)

## 2019-10-05 MED ORDER — CEFDINIR 300 MG PO CAPS: 300.0000 mg | ORAL_CAPSULE | Freq: Two times a day (BID) | ORAL | 0 refills | Status: AC

## 2019-10-05 MED ORDER — SODIUM CHLORIDE 0.9 % IV SOLN
1.0000 g | Freq: Once | INTRAVENOUS | Status: AC
  Administered 2019-10-05: 1 g via INTRAVENOUS
  Filled 2019-10-05: qty 10

## 2019-10-05 MED ORDER — IOHEXOL 300 MG/ML  SOLN
100.0000 mL | Freq: Once | INTRAMUSCULAR | Status: AC | PRN
  Administered 2019-10-05: 100 mL via INTRAVENOUS

## 2019-10-05 MED ORDER — SODIUM CHLORIDE 0.9 % IV BOLUS
1000.0000 mL | Freq: Once | INTRAVENOUS | Status: AC
  Administered 2019-10-05: 1000 mL via INTRAVENOUS

## 2019-10-05 NOTE — ED Provider Notes (Signed)
[facility] Emergency Department Provider Note  ____________________________________________   First MD Initiated Contact with Patient 10/05/19 2151     (approximate)  I have reviewed the triage vital signs and the nursing notes.  History  Chief Complaint Abdominal Pain    HPI Patricia Henson is a 28 y.o. female who presents for lower abdominal pain as well as dysuria.  Patient reports several days of sharp, lower abdominal pain.  Pain is currently 7/10 in severity, does not radiate, no identifiable alleviating or aggravating factors.  Associated with dysuria.  She denies any fevers, nausea, vomiting.  No back pain.  Today she has noticed some associated hematuria.  She does have a history of nephrolithiasis.   Past Medical Hx Past Medical History:  Diagnosis Date  . Anemia   . Gestational diabetes    diet controlled  . Pregnancy induced hypertension     Problem List Patient Active Problem List   Diagnosis Date Noted  . Hypertension affecting pregnancy 04/12/2018  . Obesity in pregnancy 04/12/2018  . Labor and delivery, indication for care 04/05/2018  . Indication for care in labor or delivery 04/04/2018  . Hypertension 03/20/2018  . Other headache syndrome 01/13/2018  . History of gestational diabetes 12/25/2017  . History of marijuana use 12/25/2017  . History of pre-eclampsia 12/25/2017  . Maternal care for abnormalities of the fetal heart rate or rhythm, second trimester, not applicable or unspecified 12/23/2017  . Obesity due to excess calories 12/23/2017    Past Surgical Hx Past Surgical History:  Procedure Laterality Date  . CHOLECYSTECTOMY    . WISDOM TOOTH EXTRACTION      Medications Prior to Admission medications   Medication Sig Start Date End Date Taking? Authorizing Provider  cefdinir (OMNICEF) 300 MG capsule Take 1 capsule (300 mg total) by mouth 2 (two) times daily for 10 days. 10/05/19 10/15/19  Lilia Pro., MD   cetirizine (ZYRTEC) 10 MG tablet Take 1 tablet (10 mg total) by mouth daily. 10/10/18   Jearld Fenton, NP  fluticasone (FLONASE) 50 MCG/ACT nasal spray Place 1 spray into both nostrils daily. 10/10/18   Jearld Fenton, NP  guaiFENesin-codeine 100-10 MG/5ML syrup Take 5 mLs by mouth at bedtime as needed for cough. 10/10/18   Jearld Fenton, NP  ibuprofen (ADVIL,MOTRIN) 600 MG tablet Take 1 tablet (600 mg total) by mouth every 8 (eight) hours as needed. 10/25/18   Darel Hong, MD  Prenatal Vit-Fe Fumarate-FA (PRENATAL MULTIVITAMIN) TABS tablet Take 1 tablet by mouth daily at 12 noon. 01/23/17   Dalia Heading, CNM    Allergies Penicillins  Family Hx Family History  Problem Relation Age of Onset  . Diabetes Mother     Social Hx Social History   Tobacco Use  . Smoking status: Never Smoker  . Smokeless tobacco: Never Used  Substance Use Topics  . Alcohol use: No  . Drug use: Not Currently    Types: Marijuana     Review of Systems  Constitutional: Negative for fever, chills. Eyes: Negative for visual changes. ENT: Negative for sore throat. Cardiovascular: Negative for chest pain. Respiratory: Negative for shortness of breath. Gastrointestinal: + lower abdominal pain Genitourinary: + for dysuria. Musculoskeletal: Negative for leg swelling. Skin: Negative for rash. Neurological: Negative for for headaches.   Physical Exam  Vital Signs: ED Triage Vitals  Enc Vitals Group     BP 10/05/19 2108 138/65     Pulse Rate 10/05/19 2108 97  Resp 10/05/19 2108 14     Temp 10/05/19 2108 98.6 F (37 C)     Temp Source 10/05/19 2108 Oral     SpO2 10/05/19 2108 98 %     Weight 10/05/19 2110 183 lb (83 kg)     Height 10/05/19 2110 5\' 2"  (1.575 m)     Head Circumference --      Peak Flow --      Pain Score 10/05/19 2109 10     Pain Loc --      Pain Edu? --      Excl. in [place]? --     Constitutional: Alert and oriented.  Head: Normocephalic. Atraumatic. Eyes:  Conjunctivae clear. Sclera anicteric. Nose: No congestion. No rhinorrhea. Mouth/Throat: Mucous membranes are moist.  Neck: No stridor.   Cardiovascular: Normal rate, regular rhythm. Extremities well perfused. Respiratory: Normal respiratory effort.  Lungs CTAB. Gastrointestinal: Soft. TTP suprapubically. No rebound or guarding. Remainder of abdomen is soft and non-tender. Musculoskeletal: No lower extremity edema. No deformities. Neurologic:  Normal speech and language. No gross focal neurologic deficits are appreciated.  Skin: Skin is warm, dry and intact. No rash noted. Psychiatric: Mood and affect are appropriate for situation.  EKG  N/A    Radiology  CT: IMPRESSION:  Partially distended bladder with question mild Apparent wall  thickening which could be due to under distention versus cystitis.   No renal or collecting system calculi.    Procedures  Procedure(s) performed (including critical care):  Procedures   Initial Impression / Assessment and Plan / ED Course  28 y.o. female who presents to the ED for lower abdominal pain, dysuria.  Ddx: UTI, early pyelonephritis, stone  Labs reveal leukocytosis to 20.  UA consistent with infection, large LE, RBCs, WBCs, bacteria.  Given her history of nephrolithiasis, will obtain a CT to rule out concurrent stone. Negative pregnancy.   CT negative for stone. Work up consistent with UTI. Receiving IVF and antibiotics, after which we will plan for discharge with course of oral antibiotics.  Patient voices understanding is comfortable plan and discharge.  Given return precautions.   Final Clinical Impression(s) / ED Diagnosis  Final diagnoses:  Urinary tract infection in female       Note:  This document was prepared using Dragon voice recognition software and may include unintentional dictation errors.   Lilia Pro., MD 10/05/19 [phone]

## 2019-10-05 NOTE — ED Triage Notes (Signed)
Pt presents via POV c/p sharp lower abd pain. Reports dysuria.

## 2019-10-05 NOTE — Discharge Instructions (Signed)
Thank you for letting us take care of you in the emergency department today.   Please continue to take any regular, prescribed medications.   New medications we have prescribed:  - Omnicef (cefdinir) - antibiotic  Please return to the ER for any new or worsening symptoms.

## 2019-10-05 NOTE — ED Notes (Signed)
Received report from [employer]

## 2019-11-24 ENCOUNTER — Ambulatory Visit: Payer: Self-pay | Admitting: Obstetrics and Gynecology

## 2019-11-30 ENCOUNTER — Encounter: Payer: Self-pay | Admitting: Emergency Medicine

## 2019-11-30 ENCOUNTER — Emergency Department
Admission: EM | Admit: 2019-11-30 | Discharge: 2019-11-30 | Disposition: A | Discharge status: Home / Self Care | Payer: Medicaid Other | Attending: Emergency Medicine | Admitting: Emergency Medicine

## 2019-11-30 ENCOUNTER — Other Ambulatory Visit: Payer: Self-pay

## 2019-11-30 DIAGNOSIS — U071 COVID-19: Secondary | ICD-10-CM | POA: Diagnosis not present

## 2019-11-30 DIAGNOSIS — R519 Headache, unspecified: Secondary | ICD-10-CM | POA: Diagnosis present

## 2019-11-30 DIAGNOSIS — I10 Essential (primary) hypertension: Secondary | ICD-10-CM | POA: Insufficient documentation

## 2019-11-30 DIAGNOSIS — R6883 Chills (without fever): Secondary | ICD-10-CM

## 2019-11-30 DIAGNOSIS — R432 Parageusia: Secondary | ICD-10-CM

## 2019-11-30 DIAGNOSIS — R52 Pain, unspecified: Secondary | ICD-10-CM

## 2019-11-30 DIAGNOSIS — Z79899 Other long term (current) drug therapy: Secondary | ICD-10-CM | POA: Insufficient documentation

## 2019-11-30 DIAGNOSIS — B349 Viral infection, unspecified: Secondary | ICD-10-CM

## 2019-11-30 NOTE — ED Triage Notes (Signed)
Here for headache, congestion, chills, loss of taste and smell. No known covid exposure.  Has not checked temp. Unlabored.

## 2019-11-30 NOTE — ED Triage Notes (Signed)
FIRST NURSE NOTE- chills and headache.  Pt reports subjective fever. Ambulatory. NAD

## 2019-11-30 NOTE — Discharge Instructions (Signed)
Please make sure you are drinking lots of fluids.  Take Tylenol 1000 mg every 6 hours as needed for chills and body aches.  If you develop any shortness of breath, chest pain, worsening symptoms, return to the emergency department.  Please quarantine until your tests have resulted.  If positive you will need to quarantine for 10 days until health department states she can return to work.  Please avoid going out into public and spreading your illness.

## 2019-11-30 NOTE — ED Notes (Addendum)
Pt states she has been feeling weak with muscle/body aches, chills, and congestion for two days. Pt states she also has a bad headache and diarrhea. Pt denies being around anyone with covid.  Pt currently in NAD.

## 2019-11-30 NOTE — ED Provider Notes (Signed)
[facility] EMERGENCY DEPARTMENT Provider Note   CSN: [id number] Arrival date & time: 11/30/19  1619     History Chief Complaint  Patient presents with  . Headache    Patricia Henson is a 29 y.o. female presents to the emergency department for evaluation of chills, body aches, headache, loss of taste and smell.  Headaches described as generalized as well as body aches.  No head trauma.  She denies any chest pain or shortness of breath.  No abdominal pain.  She has had no nausea or vomiting but has had a few episodes of diarrhea today.  No rashes or fevers.  She has not had a medications for her chills or body aches.  No known exposure to Covid but she does work at [employer].  No comorbidities.  Overall patient healthy. HPI     Past Medical History:  Diagnosis Date  . Anemia   . Gestational diabetes    diet controlled  . Pregnancy induced hypertension     Patient Active Problem List   Diagnosis Date Noted  . Hypertension affecting pregnancy 04/12/2018  . Obesity in pregnancy 04/12/2018  . Labor and delivery, indication for care 04/05/2018  . Indication for care in labor or delivery 04/04/2018  . Hypertension 03/20/2018  . Other headache syndrome 01/13/2018  . History of gestational diabetes 12/25/2017  . History of marijuana use 12/25/2017  . History of pre-eclampsia 12/25/2017  . Maternal care for abnormalities of the fetal heart rate or rhythm, second trimester, not applicable or unspecified 12/23/2017  . Obesity due to excess calories 12/23/2017    Past Surgical History:  Procedure Laterality Date  . CHOLECYSTECTOMY    . WISDOM TOOTH EXTRACTION       OB History    Gravida  7   Para  4   Term  4   Preterm      AB  3   Living  4     SAB  3   TAB      Ectopic      Multiple  0   Live Births  4           Family History  Problem Relation Age of Onset  . Diabetes Mother     Social History   Tobacco Use  . Smoking  status: Never Smoker  . Smokeless tobacco: Never Used  Substance Use Topics  . Alcohol use: No  . Drug use: Not Currently    Types: Marijuana    Home Medications Prior to Admission medications   Medication Sig Start Date End Date Taking? Authorizing Provider  cetirizine (ZYRTEC) 10 MG tablet Take 1 tablet (10 mg total) by mouth daily. 10/10/18   Jearld Fenton, NP  fluticasone (FLONASE) 50 MCG/ACT nasal spray Place 1 spray into both nostrils daily. 10/10/18   Jearld Fenton, NP  guaiFENesin-codeine 100-10 MG/5ML syrup Take 5 mLs by mouth at bedtime as needed for cough. 10/10/18   Jearld Fenton, NP  ibuprofen (ADVIL,MOTRIN) 600 MG tablet Take 1 tablet (600 mg total) by mouth every 8 (eight) hours as needed. 10/25/18   Darel Hong, MD  Prenatal Vit-Fe Fumarate-FA (PRENATAL MULTIVITAMIN) TABS tablet Take 1 tablet by mouth daily at 12 noon. 01/23/17   Dalia Heading, CNM    Allergies    Penicillins  Review of Systems   Review of Systems  Constitutional: Positive for chills.  HENT: Negative for sore throat, trouble swallowing and voice change.  Respiratory: Negative for cough, shortness of breath and wheezing.   Cardiovascular: Negative for chest pain.  Gastrointestinal: Positive for diarrhea. Negative for abdominal pain, nausea and vomiting.  Genitourinary: Negative for dysuria.  Musculoskeletal: Positive for myalgias.  Skin: Negative for rash.  Neurological: Positive for headaches. Negative for numbness.    Physical Exam Updated Vital Signs BP 137/90 (BP Location: Left Arm)   Pulse 93   Temp 98.4 F (36.9 C) (Oral)   Resp 20   Wt 81.6 kg   LMP 11/15/2019 (Approximate)   SpO2 100%   BMI 32.92 kg/m   Physical Exam  ED Results / Procedures / Treatments   Labs (all labs ordered are listed, but only abnormal results are displayed) Labs Reviewed  SARS CORONAVIRUS 2 (TAT 6-24 HRS)    EKG None  Radiology No results found.  Procedures Procedures (including  critical care time)  Medications Ordered in ED Medications - No data to display  ED Course  I have reviewed the triage vital signs and the nursing notes.  Pertinent labs & imaging results that were available during my care of the patient were reviewed by me and considered in my medical decision making (see chart for details).    MDM Rules/Calculators/A&P                      29 year old female with chills, body aches, headache, diarrhea.  Also presenting with loss of taste and smell.  Symptoms consistent with Covid.  Covid test performed, pending.  Vital signs are stable, lungs are clear to auscultation.  No reports of chest pain or shortness of breath.  She is tolerating p.o. well.  Diarrhea has been improving.  No signs of dehydration.  Patient educated on Covid, symptomatic relief with Tylenol, fluids.  She understands signs symptoms return to the ED for.  She is given a note to remain out of work. Final Clinical Impression(s) / ED Diagnoses Final diagnoses:  Viral illness  Loss of taste  Body aches  Chills (without fever)    Rx / DC Orders ED Discharge Orders    None       Renata Caprice 11/30/19 1928    Gregor Hams, MD 12/02/19 [phone]

## 2019-12-01 ENCOUNTER — Ambulatory Visit: Payer: Self-pay | Admitting: *Deleted

## 2019-12-01 ENCOUNTER — Telehealth: Payer: Self-pay | Admitting: *Deleted

## 2019-12-01 LAB — SARS CORONAVIRUS 2 (TAT 6-24 HRS): SARS Coronavirus 2: POSITIVE — AB

## 2019-12-01 NOTE — Telephone Encounter (Signed)
Patient called and requested results faxed to her employer, [employer] fax# [phone].

## 2019-12-01 NOTE — Telephone Encounter (Signed)
Pt given result; tier of symptoms reviewed; instructions given to pt: . remain in self-quarantine until they meet the "Non-Test Criteria for Ending Self-Isolation". Non-Test Criteria for Ending Self-Isolation All persons with fever and respiratory symptoms should isolate themselves until ALL conditions listed below are met: - at least 10 days since symptoms onset - AND 3 consecutive days fever free without antipyretics (acetaminophen [Tylenol] or ibuprofen [Advil]) - AND improvement in respiratory symptoms . If the patient develops respiratory issues/distress, seek medical care in the Emergency Department, call 911, reports symptoms and report COVID-19 positive test. Patient Instructions . continue to utilize over-the-counter medications for fever (ibuprofen and/or Tylenol) and cough (cough medicine and/or sore throat lozenges). . wear a mask around people and follow good infection prevention techniques. . Patient to should only leave home to seek medical care. . Instruct patient to send family for food, prescriptions or medicines; or use delivery service.  . If the patient must leave the home, they must wear a mask in public. Marland Kitchen limit contact with immediate family members or caregivers in the home, and use mask, social distancing, and handwashing to decrease risk to patients. o Please continue good preventive care measures, including frequent hand washing, avoid touching your face, cover coughs/sneezes with tissue or into elbow, stay out of crowds and keep a 6-foot distance from others.   . patient and family to clean hard surfaces touched by patient frequently with household cleaning products. Link for MyChart sent per request to [phone]; she verbalized understanding, and confirmed receipt; [facility] Dept previously notified.

## 2019-12-01 NOTE — Telephone Encounter (Addendum)
I went to the ED last night.   I'm calling to see what my COVID-19 test result is.   My question is do I go home and spread it to everyone?   (She is talking loud and very angry that they did not admit her to the hospital last night).  I let her know her result was detected meaning she had the virus.  "I don't have anywhere to go".  My roommate is pregnant and she won't let me come back home and I have kids.   (Her mother was on the phone with her).  "The only place I have to go is my car".   I asked does she live in her car and she said, No!" but then her mother broke in and said,  "Yes you do live in your car!"   Pt told her mother to "Be quiet".   Pt then asked me,  "Don't they admit you to the hospital for 14 days to quarantine?"   I let her know they don't admit people to quarantine that they quarantine at home. She said,  "They don't have a place people go to quarantine?"   I let her know no where I was aware of.  Her mother is here from [place] and said,  "I'm here from [place]".   "When I was positive they put [place] up in a place where we stayed for 14 days".   "Y'all don't have a place like that here?"   I again let her know we did not have a location where people were quarantined like she describes [place] as having.  The pt then said,  "OK"  "Bye" and ended the call.  I was not given the opportunity to go over any quarantine protocol or precautions with her.    [facility] Dept notified.

## 2020-01-18 ENCOUNTER — Encounter: Payer: Medicaid Other | Admitting: Obstetrics and Gynecology

## 2020-01-24 ENCOUNTER — Emergency Department: Payer: Medicaid Other

## 2020-01-24 ENCOUNTER — Other Ambulatory Visit: Payer: Self-pay

## 2020-01-24 ENCOUNTER — Emergency Department
Admission: EM | Admit: 2020-01-24 | Discharge: 2020-01-24 | Disposition: A | Discharge status: Home / Self Care | Payer: Medicaid Other | Attending: Student | Admitting: Student

## 2020-01-24 ENCOUNTER — Encounter: Payer: Self-pay | Admitting: Emergency Medicine

## 2020-01-24 DIAGNOSIS — O26891 Other specified pregnancy related conditions, first trimester: Secondary | ICD-10-CM | POA: Insufficient documentation

## 2020-01-24 DIAGNOSIS — N760 Acute vaginitis: Secondary | ICD-10-CM | POA: Diagnosis not present

## 2020-01-24 DIAGNOSIS — Z3A09 9 weeks gestation of pregnancy: Secondary | ICD-10-CM | POA: Diagnosis not present

## 2020-01-24 DIAGNOSIS — B9689 Other specified bacterial agents as the cause of diseases classified elsewhere: Secondary | ICD-10-CM

## 2020-01-24 DIAGNOSIS — O3410 Maternal care for benign tumor of corpus uteri, unspecified trimester: Secondary | ICD-10-CM

## 2020-01-24 DIAGNOSIS — D259 Leiomyoma of uterus, unspecified: Secondary | ICD-10-CM

## 2020-01-24 DIAGNOSIS — O3411 Maternal care for benign tumor of corpus uteri, first trimester: Secondary | ICD-10-CM | POA: Diagnosis not present

## 2020-01-24 DIAGNOSIS — O23599 Infection of other part of genital tract in pregnancy, unspecified trimester: Secondary | ICD-10-CM

## 2020-01-24 DIAGNOSIS — R1030 Lower abdominal pain, unspecified: Secondary | ICD-10-CM

## 2020-01-24 DIAGNOSIS — R102 Pelvic and perineal pain: Secondary | ICD-10-CM | POA: Diagnosis not present

## 2020-01-24 DIAGNOSIS — Z3201 Encounter for pregnancy test, result positive: Secondary | ICD-10-CM | POA: Diagnosis not present

## 2020-01-24 LAB — COMPREHENSIVE METABOLIC PANEL
ALT: 16 U/L (ref 0–44)
AST: 19 U/L (ref 15–41)
Albumin: 3.9 g/dL (ref 3.5–5.0)
Alkaline Phosphatase: 50 U/L (ref 38–126)
Anion gap: 7 (ref 5–15)
BUN: 13 mg/dL (ref 6–20)
CO2: 29 mmol/L (ref 22–32)
Calcium: 9.4 mg/dL (ref 8.9–10.3)
Chloride: 103 mmol/L (ref 98–111)
Creatinine, Ser: 0.59 mg/dL (ref 0.44–1.00)
GFR calc Af Amer: >60 mL/min (ref >60)
GFR calc non Af Amer: >60 mL/min (ref >60)
Glucose, Bld: 100 mg/dL — ABNORMAL HIGH (ref 70–99)
Potassium: 3.9 mmol/L (ref 3.5–5.1)
Sodium: 139 mmol/L (ref 135–145)
Total Bilirubin: 0.7 mg/dL (ref 0.3–1.2)
Total Protein: 7.2 g/dL (ref 6.5–8.1)

## 2020-01-24 LAB — WET PREP, GENITAL
Sperm: NONE SEEN
Trich, Wet Prep: NONE SEEN
Yeast Wet Prep HPF POC: NONE SEEN

## 2020-01-24 LAB — CBC WITH DIFFERENTIAL/PLATELET
Abs Immature Granulocytes: 0.03 10*3/uL (ref 0.00–0.07)
Basophils Absolute: 0 10*3/uL (ref 0.0–0.1)
Basophils Relative: 0 %
Eosinophils Absolute: 0.1 10*3/uL (ref 0.0–0.5)
Eosinophils Relative: 1 %
HCT: 35.9 % — ABNORMAL LOW (ref 36.0–46.0)
Hemoglobin: 12.9 g/dL (ref 12.0–15.0)
Immature Granulocytes: 0 %
Lymphocytes Relative: 22 %
Lymphs Abs: 2.6 10*3/uL (ref 0.7–4.0)
MCH: 28.6 pg (ref 26.0–34.0)
MCHC: 35.9 g/dL (ref 30.0–36.0)
MCV: 79.6 fL — ABNORMAL LOW (ref 80.0–100.0)
Monocytes Absolute: 0.7 10*3/uL (ref 0.1–1.0)
Monocytes Relative: 6 %
Neutro Abs: 8.3 10*3/uL — ABNORMAL HIGH (ref 1.7–7.7)
Neutrophils Relative %: 71 %
Platelets: 259 10*3/uL (ref 150–400)
RBC: 4.51 MIL/uL (ref 3.87–5.11)
RDW: 14.3 % (ref 11.5–15.5)
WBC: 11.6 10*3/uL — ABNORMAL HIGH (ref 4.0–10.5)
nRBC: 0 % (ref 0.0–0.2)

## 2020-01-24 LAB — URINALYSIS, COMPLETE (UACMP) WITH MICROSCOPIC
Bacteria, UA: NONE SEEN
Bilirubin Urine: NEGATIVE
Glucose, UA: NEGATIVE mg/dL
Hgb urine dipstick: NEGATIVE
Ketones, ur: 5 mg/dL — AB
Leukocytes,Ua: NEGATIVE
Nitrite: NEGATIVE
Protein, ur: NEGATIVE mg/dL
Specific Gravity, Urine: 1.021 (ref 1.005–1.030)
Squamous Epithelial / HPF: NONE SEEN (ref 0–5)
pH: 6 (ref 5.0–8.0)

## 2020-01-24 LAB — POCT PREGNANCY, URINE: Preg Test, Ur: POSITIVE — AB

## 2020-01-24 LAB — HCG, QUANTITATIVE, PREGNANCY: hCG, Beta Chain, Quant, S: 155090 m[IU]/mL — ABNORMAL HIGH (ref <5)

## 2020-01-24 MED ORDER — METRONIDAZOLE 500 MG PO TABS: 500.0000 mg | ORAL_TABLET | Freq: Two times a day (BID) | ORAL | 0 refills | Status: AC

## 2020-01-24 NOTE — Discharge Instructions (Signed)
Thank you for letting us take care of you in the emergency department today.   Please continue to take any regular, prescribed medications. Start taking an over the counter prenatal vitamin.  Your swab was positive for bacterial vaginosis, common pelvic infection that is not an STD.  We will send you home with a course of antibiotics for this.  Please follow up with: - Your OB/GYN doctor to review your ER visit and establish care for this pregnancy  Please return to the ER for any new or worsening symptoms.

## 2020-01-24 NOTE — ED Triage Notes (Signed)
Pt in via POV, with complaints of generalized abdominal pain w/ some nausea, denies vomiting, diarrhea.  Denies urinary symptoms.  Ambulatory to triage, NAD noted at this time.

## 2020-01-24 NOTE — ED Provider Notes (Signed)
[facility] Emergency Department Provider Note  ____________________________________________   First MD Initiated Contact with Patient 01/24/20 [phone]     (approximate)  I have reviewed the triage vital signs and the nursing notes.  History  Chief Complaint Abdominal Pain    HPI Patricia Henson is a 29 y.o. female past medical history as below who presents to the emergency department for lower abdominal/pelvic cramping.  Patient states symptoms started this morning around 5 AM.  Located to her lower abdomen and pelvic area.  Described as cramping and likens it to menstrual cramps, though more severe.  Currently 9/10 in severity.  No radiation.  No alleviating or aggravating components.  She reports baseline clear vaginal discharge, though this morning did notice a small amount of incorporated white discharge as well.  No concern for STD exposure.  Has experienced some associated nausea with several episodes of vomiting that she attributes to the pain.  No dysuria.  Reports irregular menses, last period was January 23, before that in mid December.  Unsure if she is pregnant.  G4.   Past Medical Hx Past Medical History:  Diagnosis Date  . Anemia   . Gestational diabetes    diet controlled  . Pregnancy induced hypertension     Problem List Patient Active Problem List   Diagnosis Date Noted  . Hypertension affecting pregnancy 04/12/2018  . Obesity in pregnancy 04/12/2018  . Labor and delivery, indication for care 04/05/2018  . Indication for care in labor or delivery 04/04/2018  . Hypertension 03/20/2018  . Other headache syndrome 01/13/2018  . History of gestational diabetes 12/25/2017  . History of marijuana use 12/25/2017  . History of pre-eclampsia 12/25/2017  . Maternal care for abnormalities of the fetal heart rate or rhythm, second trimester, not applicable or unspecified 12/23/2017  . Obesity due to excess calories 12/23/2017    Past Surgical  Hx Past Surgical History:  Procedure Laterality Date  . CHOLECYSTECTOMY    . WISDOM TOOTH EXTRACTION      Medications Prior to Admission medications   Medication Sig Start Date End Date Taking? Authorizing Provider  cetirizine (ZYRTEC) 10 MG tablet Take 1 tablet (10 mg total) by mouth daily. 10/10/18   Jearld Fenton, NP  fluticasone (FLONASE) 50 MCG/ACT nasal spray Place 1 spray into both nostrils daily. 10/10/18   Jearld Fenton, NP  guaiFENesin-codeine 100-10 MG/5ML syrup Take 5 mLs by mouth at bedtime as needed for cough. 10/10/18   Jearld Fenton, NP  ibuprofen (ADVIL,MOTRIN) 600 MG tablet Take 1 tablet (600 mg total) by mouth every 8 (eight) hours as needed. 10/25/18   Darel Hong, MD  Prenatal Vit-Fe Fumarate-FA (PRENATAL MULTIVITAMIN) TABS tablet Take 1 tablet by mouth daily at 12 noon. 01/23/17   Dalia Heading, CNM    Allergies Penicillins  Family Hx Family History  Problem Relation Age of Onset  . Diabetes Mother     Social Hx Social History   Tobacco Use  . Smoking status: Never Smoker  . Smokeless tobacco: Never Used  Substance Use Topics  . Alcohol use: No  . Drug use: Yes    Types: Marijuana     Review of Systems  Constitutional: Negative for fever, chills. Eyes: Negative for visual changes. ENT: Negative for sore throat. Cardiovascular: Negative for chest pain. Respiratory: Negative for shortness of breath. Gastrointestinal: Positive for lower abdominal cramping/pelvic pain. Genitourinary: Negative for dysuria. Musculoskeletal: Negative for leg swelling. Skin: Negative for rash. Neurological: Negative for  headaches.   Physical Exam  Vital Signs: ED Triage Vitals  Enc Vitals Group     BP 01/24/20 0938 127/79     Pulse Rate 01/24/20 0938 98     Resp 01/24/20 0938 16     Temp 01/24/20 0938 98.3 F (36.8 C)     Temp src --      SpO2 01/24/20 0938 100 %     Weight 01/24/20 0937 180 lb (81.6 kg)     Height 01/24/20 0937 5\' 2"  (1.575  m)     Head Circumference --      Peak Flow --      Pain Score 01/24/20 0937 9     Pain Loc --      Pain Edu? --      Excl. in [place]? --     Constitutional: Alert and oriented.  Head: Normocephalic. Atraumatic. Eyes: Conjunctivae clear. Sclera anicteric. Nose: No congestion. No rhinorrhea. Mouth/Throat: Wearing mask.  Neck: No stridor.   Cardiovascular: Normal rate, regular rhythm. Extremities well perfused. Respiratory: Normal respiratory effort.  Lungs CTAB. Gastrointestinal: Soft. Non-tender. Non-distended.  Pelvic: RN and NT chaperone present. Normal external genitalia, no rashes or lesions. Small amount of thin white discharge in vaginal canal. No CMT.  Musculoskeletal: No lower extremity edema. No deformities. Neurologic:  Normal speech and language. No gross focal neurologic deficits are appreciated.  Skin: Skin is warm, dry and intact. No rash noted. Psychiatric: Mood and affect are appropriate for situation.    Radiology  Ultrasound: IMPRESSION:  Nine week intrauterine pregnancy. Fetal heart rate 168 beats per minute.  4.8 cm fibroid versus contraction in the left side of the uterus.    Procedures  Procedure(s) performed (including critical care):  Procedures   Initial Impression / Assessment and Plan / ED Course  29 y.o. female who presents to the ED for lower abdominal pain/pelvic cramping.  Ddx: STD, pelvic infection, pregnancy, ectopic, UTI  POC urine pregnancy positive. Will proceed with [place].  US reveals ~9 week IUP with appropriate FHT. Fibroid vs contraction. Updated patient on results. This is an unplanned pregnancy, patient feeling overall positive about it. She has followed with [facility] for per prior pregnancies as she is considered high risk, advised outpatient follow up with her provider. Labs otherwise notable for BV, given Rx for metronidazole. Patient will obtain OTC PNV. She is comfortable w/ plan and discharge. Given return precautions.    Final  Clinical Impression(s) / ED Diagnosis  Final diagnoses:  Pelvic cramping  Positive pregnancy test  Uterine fibroid in pregnancy  Bacterial vaginosis in pregnancy       Note:  This document was prepared using Dragon voice recognition software and may include unintentional dictation errors.   Lilia Pro., MD 01/24/20 1901

## 2020-01-25 LAB — CHLAMYDIA/NGC RT PCR (ARMC ONLY)
Chlamydia Tr: NOT DETECTED
N gonorrhoeae: NOT DETECTED

## 2020-03-21 ENCOUNTER — Encounter: Payer: Self-pay | Admitting: Emergency Medicine

## 2020-03-21 ENCOUNTER — Emergency Department
Admission: EM | Admit: 2020-03-21 | Discharge: 2020-03-21 | Disposition: A | Discharge status: Home / Self Care | Payer: Medicaid Other | Attending: Emergency Medicine | Admitting: Emergency Medicine

## 2020-03-21 ENCOUNTER — Emergency Department: Payer: Medicaid Other

## 2020-03-21 ENCOUNTER — Other Ambulatory Visit: Payer: Self-pay

## 2020-03-21 DIAGNOSIS — W1830XA Fall on same level, unspecified, initial encounter: Secondary | ICD-10-CM | POA: Diagnosis not present

## 2020-03-21 DIAGNOSIS — Z79899 Other long term (current) drug therapy: Secondary | ICD-10-CM | POA: Diagnosis not present

## 2020-03-21 DIAGNOSIS — Y93K1 Activity, walking an animal: Secondary | ICD-10-CM | POA: Insufficient documentation

## 2020-03-21 DIAGNOSIS — Y999 Unspecified external cause status: Secondary | ICD-10-CM | POA: Insufficient documentation

## 2020-03-21 DIAGNOSIS — S93602A Unspecified sprain of left foot, initial encounter: Secondary | ICD-10-CM | POA: Diagnosis not present

## 2020-03-21 DIAGNOSIS — S99922A Unspecified injury of left foot, initial encounter: Secondary | ICD-10-CM | POA: Diagnosis present

## 2020-03-21 DIAGNOSIS — S80211A Abrasion, right knee, initial encounter: Secondary | ICD-10-CM | POA: Diagnosis not present

## 2020-03-21 DIAGNOSIS — Y929 Unspecified place or not applicable: Secondary | ICD-10-CM | POA: Diagnosis not present

## 2020-03-21 DIAGNOSIS — Z23 Encounter for immunization: Secondary | ICD-10-CM | POA: Diagnosis not present

## 2020-03-21 MED ORDER — TETANUS-DIPHTH-ACELL PERTUSSIS 5-2.5-18.5 LF-MCG/0.5 IM SUSP
0.5000 mL | Freq: Once | INTRAMUSCULAR | Status: AC
  Administered 2020-03-21: 0.5 mL via INTRAMUSCULAR
  Filled 2020-03-21: qty 0.5

## 2020-03-21 MED ORDER — MELOXICAM 15 MG PO TABS: 15.0000 mg | ORAL_TABLET | Freq: Every day | ORAL | 2 refills | Status: DC

## 2020-03-21 NOTE — ED Provider Notes (Signed)
[facility] Emergency Department Provider Note  ____________________________________________   First MD Initiated Contact with Patient 03/21/20 1437     (approximate)  I have reviewed the triage vital signs and the nursing notes.   HISTORY  Chief Complaint Fall    HPI Patricia Henson is a 29 y.o. female presents emergency department with complaints of left foot pain.  Patient was walking her dog and fell.  Abrasion to her right knee but her knee does not hurt.  Cannot bear weight on the left foot.  No other injuries.  Incident happened today    Past Medical History:  Diagnosis Date  . Anemia   . Gestational diabetes    diet controlled  . Pregnancy induced hypertension     Patient Active Problem List   Diagnosis Date Noted  . Hypertension affecting pregnancy 04/12/2018  . Obesity in pregnancy 04/12/2018  . Labor and delivery, indication for care 04/05/2018  . Indication for care in labor or delivery 04/04/2018  . Hypertension 03/20/2018  . Other headache syndrome 01/13/2018  . History of gestational diabetes 12/25/2017  . History of marijuana use 12/25/2017  . History of pre-eclampsia 12/25/2017  . Maternal care for abnormalities of the fetal heart rate or rhythm, second trimester, not applicable or unspecified 12/23/2017  . Obesity due to excess calories 12/23/2017    Past Surgical History:  Procedure Laterality Date  . CHOLECYSTECTOMY    . WISDOM TOOTH EXTRACTION      Prior to Admission medications   Medication Sig Start Date End Date Taking? Authorizing Provider  fluticasone (FLONASE) 50 MCG/ACT nasal spray Place 1 spray into both nostrils daily. 10/10/18   Jearld Fenton, NP  Prenatal Vit-Fe Fumarate-FA (PRENATAL MULTIVITAMIN) TABS tablet Take 1 tablet by mouth daily at 12 noon. 01/23/17   Dalia Heading, CNM    Allergies Penicillins  Family History  Problem Relation Age of Onset  . Diabetes Mother     Social  History Social History   Tobacco Use  . Smoking status: Never Smoker  . Smokeless tobacco: Never Used  Substance Use Topics  . Alcohol use: No  . Drug use: Yes    Types: Marijuana    Review of Systems  Constitutional: No fever/chills Eyes: No visual changes. ENT: No sore throat. Respiratory: Denies cough Genitourinary: Negative for dysuria. Musculoskeletal: Negative for back pain.  Positive left foot pain Skin: Negative for rash. Psychiatric: no mood changes,     ____________________________________________   PHYSICAL EXAM:  VITAL SIGNS: ED Triage Vitals  Enc Vitals Group     BP 03/21/20 1435 120/70     Pulse Rate 03/21/20 1435 88     Resp 03/21/20 1435 18     Temp 03/21/20 1435 98 F (36.7 C)     Temp Source 03/21/20 1435 Oral     SpO2 03/21/20 1435 98 %     Weight 03/21/20 1430 198 lb (89.8 kg)     Height 03/21/20 1430 5\' 2"  (1.575 m)     Head Circumference --      Peak Flow --      Pain Score 03/21/20 1429 8     Pain Loc --      Pain Edu? --      Excl. in [place]? --     Constitutional: Alert and oriented. Well appearing and in no acute distress. Eyes: Conjunctivae are normal.  Head: Atraumatic. Nose: No congestion/rhinnorhea. Mouth/Throat: Mucous membranes are moist.   Neck:  supple no  lymphadenopathy noted Cardiovascular: Normal rate, regular rhythm. Respiratory: Normal respiratory effort.  No retractions GU: deferred Musculoskeletal: FROM all extremities, warm and well perfused, left foot is tender along the fifth metatarsal, some swelling is noted, neurovascular is intact Neurologic:  Normal speech and language.  Skin:  Skin is warm, dry , abrasion to the right knee. No rash noted. Psychiatric: Mood and affect are normal. Speech and behavior are normal.  ____________________________________________   LABS (all labs ordered are listed, but only abnormal results are displayed)  Labs Reviewed - No data to  display ____________________________________________   ____________________________________________  RADIOLOGY  X-ray of the left foot no fracture  ____________________________________________   PROCEDURES  Procedure(s) performed: Ace wrap, postop shoe, crutches provided for the patient by nursing staff.    Procedures    ____________________________________________   INITIAL IMPRESSION / ASSESSMENT AND PLAN / ED COURSE  Pertinent labs & imaging results that were available during my care of the patient were reviewed by me and considered in my medical decision making (see chart for details).   Patient is a 29 year old female presents emergency department with concerns of left foot pain and abrasion to the right knee after fall prior to arrival.  See HPI  Physical exam shows patient to appear well.  Abrasion noted to the right knee, left foot is swollen and tender along fifth metatarsal, neurovascular intact.  Remainder exam is unremarkable  X-ray of the left foot Tdap  X-ray left foot is negative, Tdap was updated.  Dressing was applied to the right knee.  Left ankle is wrapped in Ace wrap, postop shoe, the patient was given crutches.  She is 4 months pregnant so she can only take Tylenol, elevate and ice, and rest.  She states she understands and will comply with our instructions.  She is discharged stable condition.   Patricia Henson was evaluated in Emergency Department on 03/21/2020 for the symptoms described in the history of present illness. She was evaluated in the context of the global COVID-19 pandemic, which necessitated consideration that the patient might be at risk for infection with the SARS-CoV-2 virus that causes COVID-19. Institutional protocols and algorithms that pertain to the evaluation of patients at risk for COVID-19 are in a state of rapid change based on information released by regulatory bodies including the CDC and federal and state organizations. These  policies and algorithms were followed during the patient's care in the ED.   As part of my medical decision making, I reviewed the following data within the [place] notes reviewed and incorporated, Old chart reviewed, Radiograph reviewed , Notes from prior ED visits and [place] Controlled Substance Database  ____________________________________________   FINAL CLINICAL IMPRESSION(S) / ED DIAGNOSES  Final diagnoses:  Sprain of left foot, initial encounter      NEW MEDICATIONS STARTED DURING THIS VISIT:  Discharge Medication List as of 03/21/2020  3:53 PM    START taking these medications   Details  meloxicam (MOBIC) 15 MG tablet Take 1 tablet (15 mg total) by mouth daily., Starting Tue 03/21/2020, Until Wed 03/21/2021, Normal         Note:  This document was prepared using Dragon voice recognition software and may include unintentional dictation errors.    Versie Starks, PA-C 03/21/20 1640    Harvest Dark, MD 03/22/20 2038

## 2020-03-21 NOTE — Discharge Instructions (Signed)
Follow-up with your regular doctor or [facility] clinic if not improving in 1 week.  Return emergency department worsening.  Elevate and ice the foot.  Wear the Ace wrap and postop shoe for comfort.  Use crutches if you have difficulty bearing weight.

## 2020-03-21 NOTE — ED Triage Notes (Signed)
Presents s/p fall   States her left ankle gave away  She fell abrasion noted to right knee  Having pain to lateral aspect on left foot  Unable to bear wt  Good pulses

## 2020-06-27 ENCOUNTER — Observation Stay
Admission: EM | Admit: 2020-06-27 | Discharge: 2020-06-27 | Disposition: A | Discharge status: Home / Self Care | Payer: Medicaid Other | Attending: Certified Nurse Midwife | Admitting: Certified Nurse Midwife

## 2020-06-27 ENCOUNTER — Other Ambulatory Visit: Payer: Self-pay

## 2020-06-27 DIAGNOSIS — O139 Gestational [pregnancy-induced] hypertension without significant proteinuria, unspecified trimester: Secondary | ICD-10-CM | POA: Diagnosis present

## 2020-06-27 DIAGNOSIS — Z3A32 32 weeks gestation of pregnancy: Secondary | ICD-10-CM | POA: Diagnosis not present

## 2020-06-27 DIAGNOSIS — R030 Elevated blood-pressure reading, without diagnosis of hypertension: Secondary | ICD-10-CM | POA: Diagnosis not present

## 2020-06-27 DIAGNOSIS — O163 Unspecified maternal hypertension, third trimester: Secondary | ICD-10-CM | POA: Diagnosis present

## 2020-06-27 LAB — COMPREHENSIVE METABOLIC PANEL
ALT: 10 U/L (ref 0–44)
AST: 16 U/L (ref 15–41)
Albumin: 3.2 g/dL — ABNORMAL LOW (ref 3.5–5.0)
Alkaline Phosphatase: 73 U/L (ref 38–126)
Anion gap: 7 (ref 5–15)
BUN: 7 mg/dL (ref 6–20)
CO2: 24 mmol/L (ref 22–32)
Calcium: 8.8 mg/dL — ABNORMAL LOW (ref 8.9–10.3)
Chloride: 107 mmol/L (ref 98–111)
Creatinine, Ser: 0.56 mg/dL (ref 0.44–1.00)
GFR calc Af Amer: >60 mL/min (ref >60)
GFR calc non Af Amer: >60 mL/min (ref >60)
Glucose, Bld: 90 mg/dL (ref 70–99)
Potassium: 3.6 mmol/L (ref 3.5–5.1)
Sodium: 138 mmol/L (ref 135–145)
Total Bilirubin: 0.4 mg/dL (ref 0.3–1.2)
Total Protein: 6.4 g/dL — ABNORMAL LOW (ref 6.5–8.1)

## 2020-06-27 LAB — CBC
HCT: 28.6 % — ABNORMAL LOW (ref 36.0–46.0)
Hemoglobin: 10.3 g/dL — ABNORMAL LOW (ref 12.0–15.0)
MCH: 29.9 pg (ref 26.0–34.0)
MCHC: 36 g/dL (ref 30.0–36.0)
MCV: 82.9 fL (ref 80.0–100.0)
Platelets: 203 10*3/uL (ref 150–400)
RBC: 3.45 MIL/uL — ABNORMAL LOW (ref 3.87–5.11)
RDW: 13.1 % (ref 11.5–15.5)
WBC: 14.5 10*3/uL — ABNORMAL HIGH (ref 4.0–10.5)
nRBC: 0 % (ref 0.0–0.2)

## 2020-06-27 LAB — URINE DRUG SCREEN, QUALITATIVE (ARMC ONLY)
Amphetamines, Ur Screen: NOT DETECTED
Barbiturates, Ur Screen: NOT DETECTED
Benzodiazepine, Ur Scrn: NOT DETECTED
Cannabinoid 50 Ng, Ur ~~LOC~~: POSITIVE — AB
Cocaine Metabolite,Ur ~~LOC~~: NOT DETECTED
MDMA (Ecstasy)Ur Screen: NOT DETECTED
Methadone Scn, Ur: NOT DETECTED
Opiate, Ur Screen: NOT DETECTED
Phencyclidine (PCP) Ur S: NOT DETECTED
Tricyclic, Ur Screen: NOT DETECTED

## 2020-06-27 LAB — PROTEIN / CREATININE RATIO, URINE
Creatinine, Urine: 178 mg/dL
Protein Creatinine Ratio: 0.09 mg/mg{Cre} (ref 0.00–0.15)
Total Protein, Urine: 16 mg/dL

## 2020-06-27 MED ORDER — LABETALOL HCL 5 MG/ML IV SOLN: 20.0000 mg | INTRAVENOUS | Status: DC | PRN

## 2020-06-27 MED ORDER — LABETALOL HCL 5 MG/ML IV SOLN: 80.0000 mg | INTRAVENOUS | Status: DC | PRN

## 2020-06-27 MED ORDER — HYDRALAZINE HCL 20 MG/ML IJ SOLN: 10.0000 mg | INTRAMUSCULAR | Status: DC | PRN

## 2020-06-27 MED ORDER — ACETAMINOPHEN 500 MG PO TABS: 1000.0000 mg | ORAL_TABLET | Freq: Four times a day (QID) | ORAL | Status: DC | PRN

## 2020-06-27 MED ORDER — ACETAMINOPHEN 325 MG PO TABS: 650.0000 mg | ORAL_TABLET | ORAL | Status: DC | PRN

## 2020-06-27 MED ORDER — LABETALOL HCL 5 MG/ML IV SOLN: 40.0000 mg | INTRAVENOUS | Status: DC | PRN

## 2020-06-27 NOTE — Progress Notes (Signed)
Pt states she recently got tested for chlamydia and was prescribed a medication that she has not picked up yet.

## 2020-06-27 NOTE — Progress Notes (Signed)
Pt left AMA. Pt made aware she may wait and sign d/c papers. Pt states she does not want to wait. Pt signed AMA papers and ambulated to personal vehicle.

## 2020-06-27 NOTE — OB Triage Note (Addendum)
Pt presents c/o bad headache unrelieved by tylenol and benedryl. Pt also c/o blurry vision. Pt states she has had abdominal pain in her upper right quadrant. Pt also has had elevated BP today. (174/99, 161/96). Pt states she has been going to [facility] because she is high risks, but her ride couldn't take her that far so they brought her here. No swelling noted. +1 reflexes. BP cycling q15. Pt denies bleeding or LOF. Reports positive fetal movement.  VSS. Will continue to monitor.

## 2020-06-28 NOTE — Discharge Summary (Signed)
Patient ID: Patricia Henson MRN: [id number] DOB/AGE: 1991/08/23 29 y.o.  Admit date: 06/27/2020 Discharge date: 06/28/2020  Admission Diagnoses: Elevated BP  Discharge Diagnoses: Elevated BP, left AMA  Prenatal Procedures: NST  Consults: None  Significant Diagnostic Studies:  Results for orders placed or performed during the hospital encounter of 06/27/20 (from the past 168 hour(s))  Protein / creatinine ratio, urine   Collection Time: 06/27/20  6:02 PM  Result Value Ref Range   Creatinine, Urine 178 mg/dL   Total Protein, Urine 16 mg/dL   Protein Creatinine Ratio 0.09 0.00 - 0.15 mg/mg[Cre]  Urine Drug Screen, Qualitative ([place] only)   Collection Time: 06/27/20  6:02 PM  Result Value Ref Range   Tricyclic, Ur Screen NONE DETECTED NONE DETECTED   Amphetamines, Ur Screen NONE DETECTED NONE DETECTED   MDMA (Ecstasy)Ur Screen NONE DETECTED NONE DETECTED   Cocaine Metabolite,Ur [place] NONE DETECTED NONE DETECTED   Opiate, Ur Screen NONE DETECTED NONE DETECTED   Phencyclidine (PCP) Ur S NONE DETECTED NONE DETECTED   Cannabinoid 50 Ng, Ur [place] POSITIVE (A) NONE DETECTED   Barbiturates, Ur Screen NONE DETECTED NONE DETECTED   Benzodiazepine, Ur Scrn NONE DETECTED NONE DETECTED   Methadone Scn, Ur NONE DETECTED NONE DETECTED  CBC   Collection Time: 06/27/20  6:17 PM  Result Value Ref Range   WBC 14.5 (H) 4.0 - 10.5 K/uL   RBC 3.45 (L) 3.87 - 5.11 MIL/uL   Hemoglobin 10.3 (L) 12.0 - 15.0 g/dL   HCT 28.6 (L) 36 - 46 %   MCV 82.9 80.0 - 100.0 fL   MCH 29.9 26.0 - 34.0 pg   MCHC 36.0 30.0 - 36.0 g/dL   RDW 13.1 11.5 - 15.5 %   Platelets 203 150 - 400 K/uL   nRBC 0.0 0.0 - 0.2 %  Comprehensive metabolic panel   Collection Time: 06/27/20  6:17 PM  Result Value Ref Range   Sodium 138 135 - 145 mmol/L   Potassium 3.6 3.5 - 5.1 mmol/L   Chloride 107 98 - 111 mmol/L   CO2 24 22 - 32 mmol/L   Glucose, Bld 90 70 - 99 mg/dL   BUN 7 6 - 20 mg/dL   Creatinine, Ser 0.56 0.44 - 1.00 mg/dL    Calcium 8.8 (L) 8.9 - 10.3 mg/dL   Total Protein 6.4 (L) 6.5 - 8.1 g/dL   Albumin 3.2 (L) 3.5 - 5.0 g/dL   AST 16 15 - 41 U/L   ALT 10 0 - 44 U/L   Alkaline Phosphatase 73 38 - 126 U/L   Total Bilirubin 0.4 0.3 - 1.2 mg/dL   GFR calc non Af Amer >60 >60 mL/min   GFR calc Af Amer >60 >60 mL/min   Anion gap 7 5 - 15    Treatments: none  Hospital Course:  This is a 29 y.o. [id number] with IUP at [redacted]w[redacted]d Prenatal care from [facility] M[facility].   06/26/20 Seen in [facility] hospital triage for evaluation possible CHTN with superimposed Pre-E - left AMA 06/27/20 Seen in the office [facility] MFM) and was asked to go into [facility] hospital for "medications, betamethasone, and delivery as indicated." When she asked if she could go into [place] they document that they told her "It was relayed to the patient that she should go to [facility], because of the concern for preterm superimposed preeclampsia; [name] [facility] would likely transfer the patient to [facility] if she presented there which would only delay her care, and require EMS transport,  etc."  Serial BPs and labs were collected.  Reviewed labs and BPs with pt and the need to be seen at [facility] by her providers, she decided to leave AMA.  Discharge Physical Exam:  BP (!) 150/65   Pulse (!) 102   Temp 98.4 F (36.9 C) (Oral)   Resp 18   Ht 5\' 2"  (1.575 m)   Wt 92.1 kg   LMP 11/15/2019 (Approximate)   BMI 37.13 kg/m   General: NAD CV: RRR Pulm: CTABL, nl effort ABD: s/nd/nt, gravid DVT Evaluation: LE non-ttp, no evidence of DVT on exam.  ST: FHR baseline: 135 bpm Variability: moderate Accelerations: yes Decelerations: none Time: 2 hours Category/reactivity: reactive  TOCO: quiet      Discharge Condition: AMA  Disposition:    Allergies as of 06/27/2020      Reactions   Penicillins Rash         Medication List    STOP taking these medications   fluticasone 50 MCG/ACT nasal spray Commonly known as: FLONASE     TAKE these medications   aspirin 81 MG chewable  tablet Chew by mouth daily.   prenatal multivitamin Tabs tablet Take 1 tablet by mouth daily at 12 noon.        SignedLinda Hedges, CNM 06/28/2020 12:45 AM

## 2020-12-07 ENCOUNTER — Ambulatory Visit: Payer: Medicaid Other

## 2021-02-23 ENCOUNTER — Emergency Department
Admission: EM | Admit: 2021-02-23 | Discharge: 2021-02-23 | Discharge status: Against Medical Advice | Payer: Medicaid Other | Attending: Emergency Medicine | Admitting: Emergency Medicine

## 2021-02-23 ENCOUNTER — Emergency Department: Payer: Medicaid Other

## 2021-02-23 ENCOUNTER — Other Ambulatory Visit: Payer: Self-pay

## 2021-02-23 ENCOUNTER — Encounter: Payer: Self-pay | Admitting: Emergency Medicine

## 2021-02-23 ENCOUNTER — Emergency Department
Admission: EM | Admit: 2021-02-23 | Discharge: 2021-02-24 | Disposition: A | Discharge status: Home / Self Care | Payer: Medicaid Other | Source: Home / Self Care | Attending: Emergency Medicine | Admitting: Emergency Medicine

## 2021-02-23 DIAGNOSIS — O3680X0 Pregnancy with inconclusive fetal viability, not applicable or unspecified: Secondary | ICD-10-CM

## 2021-02-23 DIAGNOSIS — R1030 Lower abdominal pain, unspecified: Secondary | ICD-10-CM | POA: Insufficient documentation

## 2021-02-23 DIAGNOSIS — Z3A00 Weeks of gestation of pregnancy not specified: Secondary | ICD-10-CM | POA: Diagnosis not present

## 2021-02-23 DIAGNOSIS — I10 Essential (primary) hypertension: Secondary | ICD-10-CM | POA: Insufficient documentation

## 2021-02-23 DIAGNOSIS — R109 Unspecified abdominal pain: Secondary | ICD-10-CM | POA: Diagnosis not present

## 2021-02-23 DIAGNOSIS — O26859 Spotting complicating pregnancy, unspecified trimester: Secondary | ICD-10-CM | POA: Insufficient documentation

## 2021-02-23 DIAGNOSIS — O209 Hemorrhage in early pregnancy, unspecified: Secondary | ICD-10-CM

## 2021-02-23 DIAGNOSIS — N939 Abnormal uterine and vaginal bleeding, unspecified: Secondary | ICD-10-CM | POA: Insufficient documentation

## 2021-02-23 LAB — CBC WITH DIFFERENTIAL/PLATELET
Abs Immature Granulocytes: 0.05 10*3/uL (ref 0.00–0.07)
Basophils Absolute: 0.1 10*3/uL (ref 0.0–0.1)
Basophils Relative: 0 %
Eosinophils Absolute: 0.1 10*3/uL (ref 0.0–0.5)
Eosinophils Relative: 1 %
HCT: 32.5 % — ABNORMAL LOW (ref 36.0–46.0)
Hemoglobin: 11.7 g/dL — ABNORMAL LOW (ref 12.0–15.0)
Immature Granulocytes: 0 %
Lymphocytes Relative: 25 %
Lymphs Abs: 2.9 10*3/uL (ref 0.7–4.0)
MCH: 28.4 pg (ref 26.0–34.0)
MCHC: 36 g/dL (ref 30.0–36.0)
MCV: 78.9 fL — ABNORMAL LOW (ref 80.0–100.0)
Monocytes Absolute: 0.7 10*3/uL (ref 0.1–1.0)
Monocytes Relative: 6 %
Neutro Abs: 7.8 10*3/uL — ABNORMAL HIGH (ref 1.7–7.7)
Neutrophils Relative %: 68 %
Platelets: 259 10*3/uL (ref 150–400)
RBC: 4.12 MIL/uL (ref 3.87–5.11)
RDW: 13.2 % (ref 11.5–15.5)
WBC: 11.6 10*3/uL — ABNORMAL HIGH (ref 4.0–10.5)
nRBC: 0 % (ref 0.0–0.2)

## 2021-02-23 LAB — COMPREHENSIVE METABOLIC PANEL
ALT: 16 U/L (ref 0–44)
AST: 17 U/L (ref 15–41)
Albumin: 3.9 g/dL (ref 3.5–5.0)
Alkaline Phosphatase: 48 U/L (ref 38–126)
Anion gap: 8 (ref 5–15)
BUN: 15 mg/dL (ref 6–20)
CO2: 26 mmol/L (ref 22–32)
Calcium: 9.5 mg/dL (ref 8.9–10.3)
Chloride: 105 mmol/L (ref 98–111)
Creatinine, Ser: 0.87 mg/dL (ref 0.44–1.00)
GFR, Estimated: >60 mL/min (ref >60)
Glucose, Bld: 96 mg/dL (ref 70–99)
Potassium: 3.9 mmol/L (ref 3.5–5.1)
Sodium: 139 mmol/L (ref 135–145)
Total Bilirubin: 0.4 mg/dL (ref 0.3–1.2)
Total Protein: 6.7 g/dL (ref 6.5–8.1)

## 2021-02-23 LAB — URINALYSIS, COMPLETE (UACMP) WITH MICROSCOPIC
Bilirubin Urine: NEGATIVE
Glucose, UA: NEGATIVE mg/dL
Ketones, ur: NEGATIVE mg/dL
Leukocytes,Ua: NEGATIVE
Nitrite: NEGATIVE
Protein, ur: NEGATIVE mg/dL
Specific Gravity, Urine: 1.023 (ref 1.005–1.030)
pH: 6 (ref 5.0–8.0)

## 2021-02-23 LAB — HCG, QUANTITATIVE, PREGNANCY: hCG, Beta Chain, Quant, S: 665 m[IU]/mL — ABNORMAL HIGH (ref <5)

## 2021-02-23 LAB — POC URINE PREG, ED: Preg Test, Ur: POSITIVE — AB

## 2021-02-23 LAB — ABO/RH: ABO/RH(D): A POS

## 2021-02-23 MED ORDER — MORPHINE SULFATE (PF) 2 MG/ML IV SOLN
2.0000 mg | Freq: Once | INTRAVENOUS | Status: AC
  Administered 2021-02-24: 2 mg via INTRAMUSCULAR
  Filled 2021-02-23: qty 1

## 2021-02-23 NOTE — ED Provider Notes (Signed)
[facility] Emergency Department Provider Note  ____________________________________________   Event Date/Time   First MD Initiated Contact with Patient 02/23/21 2031     (approximate)  I have reviewed the triage vital signs and the nursing notes.   HISTORY  Chief Complaint Vaginal Bleeding  HPI Patricia Henson is a 30 y.o. female who presents to the emergency department for evaluation of lower abdominal cramping as well as vaginal bleeding that began today.  Patient was seen in our department earlier today for evaluation of similar but was unable to stay for completion of work-up stating that she needed to take care of her kids.  She presents back today for same complaint as prior.  Patient reports heavy bleeding that started off as spotting but has become worse throughout the day.  She denies any passage of clots.  She denies any dizziness, shortness of breath or headache.  This is her sixth pregnancy, otherwise she reports all other pregnancies were healthy.  Her last menstrual period was sometime in January, states that she thinks that she regular, but states that this was only her second.  After the birth of her child whom is only 62 months old.  She denies any nausea/vomiting, fever, chills or other systemic symptoms.       Past Medical History:  Diagnosis Date  . Anemia   . Gestational diabetes    diet controlled  . Pregnancy induced hypertension     Patient Active Problem List   Diagnosis Date Noted  . Elevated blood pressure affecting pregnancy in third trimester, antepartum 06/27/2020  . Hypertension affecting pregnancy 04/12/2018  . Obesity in pregnancy 04/12/2018  . Labor and delivery, indication for care 04/05/2018  . Indication for care in labor or delivery 04/04/2018  . Hypertension 03/20/2018  . Other headache syndrome 01/13/2018  . History of gestational diabetes 12/25/2017  . History of marijuana use 12/25/2017  . History of  pre-eclampsia 12/25/2017  . Maternal care for abnormalities of the fetal heart rate or rhythm, second trimester, not applicable or unspecified 12/23/2017  . Obesity due to excess calories 12/23/2017    Past Surgical History:  Procedure Laterality Date  . CHOLECYSTECTOMY    . WISDOM TOOTH EXTRACTION      Prior to Admission medications   Not on File    Allergies Penicillins  Family History  Problem Relation Age of Onset  . Diabetes Mother     Social History Social History   Tobacco Use  . Smoking status: Never Smoker  . Smokeless tobacco: Never Used  Substance Use Topics  . Alcohol use: No  . Drug use: Yes    Types: Marijuana    Comment: 2 weeks ago last use    Review of Systems Constitutional: No fever/chills Eyes: No visual changes. ENT: No sore throat. Cardiovascular: Denies chest pain. Respiratory: Denies shortness of breath. Gastrointestinal: + abdominal pain.  No nausea, no vomiting.  No diarrhea.  No constipation. Genitourinary: + Vaginal bleeding, negative for dysuria. Musculoskeletal: Negative for back pain. Skin: Negative for rash. Neurological: Negative for headaches, focal weakness or numbness.  ____________________________________________   PHYSICAL EXAM:  VITAL SIGNS: ED Triage Vitals  Enc Vitals Group     BP 02/23/21 2008 (!) 133/97     Pulse Rate 02/23/21 2008 99     Resp 02/23/21 2008 16     Temp 02/23/21 2008 98.4 F (36.9 C)     Temp Source 02/23/21 2008 Oral     SpO2 02/23/21  2008 98 %     Weight 02/23/21 2007 182 lb (82.6 kg)     Height 02/23/21 2007 5\' 2"  (1.575 m)     Head Circumference --      Peak Flow --      Pain Score 02/23/21 2017 7     Pain Loc --      Pain Edu? --      Excl. in [place]? --    Constitutional: Alert and oriented. Well appearing and in no acute distress. Eyes: Conjunctivae are normal. PERRL. EOMI. Head: Atraumatic. Nose: No congestion/rhinnorhea. Mouth/Throat: Mucous membranes are moist.  Oropharynx  non-erythematous. Neck: No stridor.   Cardiovascular: Normal rate, regular rhythm. Grossly normal heart sounds.  Good peripheral circulation. Respiratory: Normal respiratory effort.  No retractions. Lungs CTAB. Gastrointestinal: Soft.  Mild suprapubic tenderness.  No distention. No abdominal bruits. No CVA tenderness. Musculoskeletal: No lower extremity tenderness nor edema.  No joint effusions. Neurologic:  Normal speech and language. No gross focal neurologic deficits are appreciated. No gait instability. Skin:  Skin is warm, dry and intact. No rash noted. Psychiatric: Mood and affect are normal. Speech and behavior are normal.  ____________________________________________   LABS (all labs ordered are listed, but only abnormal results are displayed)  Labs Reviewed  URINALYSIS, COMPLETE (UACMP) WITH MICROSCOPIC - Abnormal; Notable for the following components:      Result Value   Color, Urine YELLOW (*)    APPearance CLEAR (*)    Hgb urine dipstick LARGE (*)    Bacteria, UA RARE (*)    All other components within normal limits  POC URINE PREG, ED - Abnormal; Notable for the following components:   Preg Test, Ur POSITIVE (*)    All other components within normal limits  ABO/RH   ____________________________________________  RADIOLOGY  Official radiology report(s): US OB LESS THAN 14 WEEKS WITH OB TRANSVAGINAL  Result Date: 02/23/2021 CLINICAL DATA:  Vaginal bleeding positive HCG EXAM: OBSTETRIC <14 WK [place] AND TRANSVAGINAL OB US TECHNIQUE: Both transabdominal and transvaginal ultrasound examinations were performed for complete evaluation of the gestation as well as the maternal uterus, adnexal regions, and pelvic cul-de-sac. Transvaginal technique was performed to assess early pregnancy. COMPARISON:  None. FINDINGS: Intrauterine gestational sac: None Yolk sac:  Not Visualized. Embryo:  Not Visualized. Maternal uterus/adnexae: Ovaries are within normal limits. Left ovary measures 2.6 x  1.1 x 2.4 cm. The right ovary measures 4 x 4.1 x 2.5 cm. Small free fluid in the pelvis IMPRESSION: No IUP identified. Findings consistent with pregnancy of unknown location, differential of which includes IUP too early to visualize, recent failed pregnancy, and occult ectopic pregnancy. Recommend trending of HCG with repeat ultrasound as indicated. Electronically Signed   By: Donavan Foil M.D.   On: 02/23/2021 23:07    ____________________________________________   INITIAL IMPRESSION / ASSESSMENT AND PLAN / ED COURSE  As part of my medical decision making, I reviewed the following data within the [place] notes reviewed and incorporated, Labs reviewed, Evaluated by EM attending Dr. Cherylann Banas and Notes from prior ED visits        Patient is a 30 year old female who presents to the emergency department for evaluation of vaginal bleeding after recent positive him pregnancy test.  She was seen in our department earlier but left before completion of her work-up.  She does report mild lower abdominal cramping as well as the vaginal bleeding.  She denies any dysuria, nausea, vomiting, fever or chills.  Review of  her work-up from earlier reveals a beta-hCG in the 100s.  Laboratory evaluation initially stable.  During this visit, an ABO Rh was obtained and is pending.  Urinalysis was obtained and does demonstrate rare bacteria but no leukocytes or nitrites.  At this time, the patient is pending an OB ultrasound.  Due to staffing limitations in the [facility] side of the ER, the patient is being transferred to the main side for completion of work-up with Dr. Cherylann Banas.  Handoff was completed and patient is stable at time of transfer to the main side.        ____________________________________________   FINAL CLINICAL IMPRESSION(S) / ED DIAGNOSES  Final diagnoses:  Vaginal bleeding     ED Discharge Orders    None      *Please note:  JAXSYN CATALFAMO was evaluated in  Emergency Department on 02/23/2021 for the symptoms described in the history of present illness. She was evaluated in the context of the global COVID-19 pandemic, which necessitated consideration that the patient might be at risk for infection with the SARS-CoV-2 virus that causes COVID-19. Institutional protocols and algorithms that pertain to the evaluation of patients at risk for COVID-19 are in a state of rapid change based on information released by regulatory bodies including the CDC and federal and state organizations. These policies and algorithms were followed during the patient's care in the ED.  Some ED evaluations and interventions may be delayed as a result of limited staffing during and the pandemic.*   Note:  This document was prepared using Dragon voice recognition software and may include unintentional dictation errors.   Marlana Salvage, PA 02/23/21 2318    Arta Silence, MD 02/24/21 [phone]

## 2021-02-23 NOTE — ED Notes (Addendum)
See triage note  Presents with some abd cramping and vaginal bleeding  States she took 2 home preg test that was positive  Developed bleeding while at work    States she developed a baby 6 months ago so her periods just started back about 1-2 months ago

## 2021-02-23 NOTE — ED Triage Notes (Signed)
Pt presents to ER for second time today d/t vaginal bleeding.  Pt states she has gone through "at least 7 or 8 pads."  Pt states bleeding started off as spotting, but has become worse throughout the day.  Pt states she had to leave earlier to pick up her children, and was only waiting on [place] to be completed.  Pt A&Ox4 at this time.

## 2021-02-23 NOTE — ED Provider Notes (Signed)
[facility] Emergency Department Provider Note  ____________________________________________   Event Date/Time   First MD Initiated Contact with Patient 02/23/21 1336     (approximate)  I have reviewed the triage vital signs and the nursing notes.   HISTORY  Chief Complaint Vaginal Bleeding   HPI Patricia Henson is a 30 y.o. female presents to the ED with complaint of abdominal cramping and vaginal bleeding.  Patient states that she took 2 home pregnancy test which was positive.  Patient states that she began bleeding today.  She reports that she has vaginal bleeding like a regular period.  Patient reports that she delivered a baby 6 months ago and that her regular periods just returned 1 to 2 months ago.  She rates her pain as an 8 out of 10.       Past Medical History:  Diagnosis Date  . Anemia   . Gestational diabetes    diet controlled  . Pregnancy induced hypertension     Patient Active Problem List   Diagnosis Date Noted  . Elevated blood pressure affecting pregnancy in third trimester, antepartum 06/27/2020  . Hypertension affecting pregnancy 04/12/2018  . Obesity in pregnancy 04/12/2018  . Labor and delivery, indication for care 04/05/2018  . Indication for care in labor or delivery 04/04/2018  . Hypertension 03/20/2018  . Other headache syndrome 01/13/2018  . History of gestational diabetes 12/25/2017  . History of marijuana use 12/25/2017  . History of pre-eclampsia 12/25/2017  . Maternal care for abnormalities of the fetal heart rate or rhythm, second trimester, not applicable or unspecified 12/23/2017  . Obesity due to excess calories 12/23/2017    Past Surgical History:  Procedure Laterality Date  . CHOLECYSTECTOMY    . WISDOM TOOTH EXTRACTION      Prior to Admission medications   Not on File    Allergies Penicillins  Family History  Problem Relation Age of Onset  . Diabetes Mother     Social History Social  History   Tobacco Use  . Smoking status: Never Smoker  . Smokeless tobacco: Never Used  Substance Use Topics  . Alcohol use: No  . Drug use: Yes    Types: Marijuana    Comment: 2 weeks ago last use    Review of Systems Constitutional: No fever/chills Eyes: No visual changes. Cardiovascular: Denies chest pain. Respiratory: Denies shortness of breath. Gastrointestinal: No abdominal pain.  No nausea, no vomiting.  No diarrhea. Genitourinary: Positive vaginal bleeding. Musculoskeletal: Negative for skeletal pain. Skin: Negative for rash. Neurological: Negative for headaches, focal weakness or numbness. ____________________________________________   PHYSICAL EXAM:  VITAL SIGNS: ED Triage Vitals  Enc Vitals Group     BP 02/23/21 1331 (!) 129/99     Pulse Rate 02/23/21 1331 93     Resp 02/23/21 1331 18     Temp 02/23/21 1331 98.7 F (37.1 C)     Temp Source 02/23/21 1331 Oral     SpO2 02/23/21 1331 100 %     Weight 02/23/21 1332 180 lb (81.6 kg)     Height 02/23/21 1332 5\' 2"  (1.575 m)     Head Circumference --      Peak Flow --      Pain Score 02/23/21 1331 8     Pain Loc --      Pain Edu? --      Excl. in [place]? --     Constitutional: Alert and oriented. Well appearing and in no acute distress.  Eyes: Conjunctivae are normal.  Head: Atraumatic. Neck: No stridor.   Cardiovascular: Normal rate, regular rhythm. Grossly normal heart sounds.  Good peripheral circulation. Respiratory: Normal respiratory effort.  No retractions. Lungs CTAB. Gastrointestinal: Soft and nontender. No distention.  Bowel sounds normoactive x4 quadrants. Musculoskeletal: Moves upper and lower extremities with any difficulty.  Patient is ambulatory without any assistance. Neurologic:  Normal speech and language. No gross focal neurologic deficits are appreciated. No gait instability. Skin:  Skin is warm, dry and intact. No rash noted. Psychiatric: Mood and affect are normal. Speech and behavior are  normal.  ____________________________________________   LABS (all labs ordered are listed, but only abnormal results are displayed)  Labs Reviewed  CBC WITH DIFFERENTIAL/PLATELET - Abnormal; Notable for the following components:      Result Value   WBC 11.6 (*)    Hemoglobin 11.7 (*)    HCT 32.5 (*)    MCV 78.9 (*)    Neutro Abs 7.8 (*)    All other components within normal limits  HCG, QUANTITATIVE, PREGNANCY - Abnormal; Notable for the following components:   hCG, Beta Chain, Quant, S 665 (*)    All other components within normal limits  COMPREHENSIVE METABOLIC PANEL  POC URINE PREG, ED     PROCEDURES  Procedure(s) performed (including Critical Care):  Procedures   ____________________________________________   INITIAL IMPRESSION / ASSESSMENT AND PLAN / ED COURSE  As part of my medical decision making, I reviewed the following data within the electronic MEDICAL RECORD NUMBER Notes from prior ED visits and [place] Controlled Substance Database  ----------------------------------------- 3:58 PM on 02/23/2021 ----------------------------------------- Ultrasound came to get patient for vaginal ultrasound to evaluate her vaginal bleeding and her beta hCG being elevated at 665.  Patient was not in the room.  Halford Decamp, RN called patient on her cell phone and patient states that she had to leave to pick up children but would like to "come back".   ____________________________________________   FINAL CLINICAL IMPRESSION(S) / ED DIAGNOSES  Final diagnoses:  Vaginal bleeding affecting early pregnancy     ED Discharge Orders    None      *Please note:  Patricia Henson was evaluated in Emergency Department on 02/23/2021 for the symptoms described in the history of present illness. She was evaluated in the context of the global COVID-19 pandemic, which necessitated consideration that the patient might be at risk for infection with the SARS-CoV-2 virus that causes COVID-19.  Institutional protocols and algorithms that pertain to the evaluation of patients at risk for COVID-19 are in a state of rapid change based on information released by regulatory bodies including the CDC and federal and state organizations. These policies and algorithms were followed during the patient's care in the ED.  Some ED evaluations and interventions may be delayed as a result of limited staffing during and the pandemic.*   Note:  This document was prepared using Dragon voice recognition software and may include unintentional dictation errors.    Johnn Hai, PA-C 02/23/21 1603    Harvest Dark, MD 02/24/21 1419

## 2021-02-23 NOTE — ED Triage Notes (Signed)
Pt comes into the ED via POV c/o vaginal bleeding that started today.  PT states she took a pregnancy test earlier in the week which came back positive and now she is having a full "period like bleed".  Pt in NAD at this time.  Pregnancy #6 for the patient with no h/o miscarriages.

## 2021-02-23 NOTE — ED Notes (Signed)
Pt taken to Ultrasound.

## 2021-02-23 NOTE — ED Notes (Signed)
U/S tech here to get pt for test  Pt not in room at that time  I spoke with pt states she had to leave to pick up her child  But is coming back    Provider made aware

## 2021-02-24 NOTE — ED Provider Notes (Signed)
-----------------------------------------   1:01 AM on 02/24/2021 -----------------------------------------  I took over care of this patient from [place].  The patient presented with vaginal bleeding in early pregnancy with an unclear LMP and was pending Rh type and ultrasound.  The patient is Rh+.  Ultrasound shows no evidence of IUP.  I performed a pelvic exam.  There was a small amount of blood pooling in the vaginal vault but no active hemorrhage.  Cervical os is closed.  The patient had some increased cramping prior to my exam so I gave a small dose of morphine since it would be quicker acting and giving her p.o. Tylenol before the exam.  However, the patient is overall well-appearing.  The abdomen is soft without tenderness or peritoneal signs, and her pain is improved after my exam.  Overall presentation is most consistent with spontaneous miscarriage.  The hCG was only 600 earlier today so it is possibly too early to see an IUP.  I have a low suspicion for ectopic pregnancy and there is no evidence of rupture or other acute complication.  I consulted Dr. Amalia Hailey from OB/GYN who agreed with current plan and recommended follow-up at his office on Monday.  I counseled the patient on the results of the work-up and the plan of care.  She agrees to follow-up.  Return precautions provided, she expressed understanding.   Arta Silence, MD 02/24/21 [phone]

## 2021-02-24 NOTE — Discharge Instructions (Addendum)
Call [facility] on Monday to arrange for follow-up that day.  Let them know that you were seen in the ER on Friday night and Dr. Amalia Hailey instructed you to be sent for follow-up on Monday.  In the meantime, return to the ED for new, worsening, or persistent severe pain, worsening bleeding, lightheadedness, weakness, vomiting, fever, or any other new or worsening symptoms that concern you.

## 2021-02-26 ENCOUNTER — Telehealth: Payer: Self-pay | Admitting: Obstetrics and Gynecology

## 2021-02-26 NOTE — Telephone Encounter (Signed)
I called patient back to let her know that she would need a lab (Beta) drawn today. Patient ask if she would get the results back today. I explained that it would be tomorrow before results would be back. Patient got upset and said can she just go to [facility] to have the labs drawn. I told her that if she would like to go there she can. Patient ask if she would get the results back today for them. I explained that I was not sure how long it takes for the labs at other facilities. Patient got mad and hung up.

## 2021-02-26 NOTE — Telephone Encounter (Signed)
Patricia Henson called in and states she was in the ER over the weekend.  She recently tested positive for pregnancy and started experiencing heavy bleeding.  Patient states she was told by the on-call doctor that she needed to be seen TODAY.  Patient states she is supposed to follow up here today to confirm if she is still pregnant or if she miscarried.  Please advise.

## 2021-02-28 ENCOUNTER — Other Ambulatory Visit: Payer: Medicaid Other

## 2021-02-28 ENCOUNTER — Other Ambulatory Visit: Payer: Self-pay

## 2021-02-28 DIAGNOSIS — O039 Complete or unspecified spontaneous abortion without complication: Secondary | ICD-10-CM

## 2021-04-26 ENCOUNTER — Ambulatory Visit: Payer: Medicaid Other

## 2021-11-04 ENCOUNTER — Other Ambulatory Visit: Payer: Self-pay

## 2021-11-04 ENCOUNTER — Observation Stay
Admission: EM | Admit: 2021-11-04 | Discharge: 2021-11-04 | Disposition: A | Discharge status: Home / Self Care | Payer: Medicaid Other | Attending: Obstetrics and Gynecology | Admitting: Obstetrics and Gynecology

## 2021-11-04 ENCOUNTER — Encounter: Payer: Self-pay | Admitting: Obstetrics and Gynecology

## 2021-11-04 DIAGNOSIS — O4703 False labor before 37 completed weeks of gestation, third trimester: Secondary | ICD-10-CM | POA: Diagnosis present

## 2021-11-04 DIAGNOSIS — Z3A35 35 weeks gestation of pregnancy: Secondary | ICD-10-CM | POA: Diagnosis not present

## 2021-11-04 NOTE — Discharge Summary (Signed)
Physician Final Progress Note  Patient ID: Patricia Henson MRN: [id number] DOB/AGE: 04/13/1991 30 y.o.  Admit date: 11/04/2021 Admitting provider: Malachy Mood, MD Discharge date: 11/04/2021   Admission Diagnoses: Labor and delivery indication for care or intervention  Discharge Diagnoses:  Principal Problem:   Labor and delivery indication for care or intervention  30 y.o. [id number] at [redacted]w[redacted]d presenting with contractions.  No noted to be contracting beyond some occasional variability cervix 1/50/-2 station.  No LOF, no VB, +FM   Pregnancy notable for CHTN, substance abuse, elevated 1-hr glucose test, and sickle cell trait.  Care at [facility]  Consults: None  Significant Findings/ Diagnostic Studies: none  Procedures:  Baseline: 145 Variability: moderate Accelerations: present Decelerations: absent Tocometry: irritability The patient was monitored for 30 minutes, fetal heart rate tracing was deemed reactive, category I tracing,  CPT [id number]   Discharge Condition: good  Disposition: Discharge disposition: 01-Home or Self Care       Diet: Regular diet  Discharge Activity: Activity as tolerated  Discharge Instructions     Discharge activity:  No Restrictions   Complete by: As directed    Discharge diet:  No restrictions   Complete by: As directed    Fetal Kick Count:  Lie on our left side for one hour after a meal, and count the number of times your baby kicks.  If it is less than 5 times, get up, move around and drink some juice.  Repeat the test 30 minutes later.  If it is still less than 5 kicks in an hour, notify your doctor.   Complete by: As directed    LABOR:  When conractions begin, you should start to time them from the beginning of one contraction to the beginning  of the next.  When contractions are 5 - 10 minutes apart or less and have been regular for at least an hour, you should call your health care provider.   Complete by: As directed    No sexual  activity restrictions   Complete by: As directed    Notify physician for bleeding from the vagina   Complete by: As directed    Notify physician for blurring of vision or spots before the eyes   Complete by: As directed    Notify physician for chills or fever   Complete by: As directed    Notify physician for fainting spells, "black outs" or loss of consciousness   Complete by: As directed    Notify physician for increase in vaginal discharge   Complete by: As directed    Notify physician for leaking of fluid   Complete by: As directed    Notify physician for pain or burning when urinating   Complete by: As directed    Notify physician for pelvic pressure (sudden increase)   Complete by: As directed    Notify physician for severe or continued nausea or vomiting   Complete by: As directed    Notify physician for sudden gushing of fluid from the vagina (with or without continued leaking)   Complete by: As directed    Notify physician for sudden, constant, or occasional abdominal pain   Complete by: As directed    Notify physician if baby moving less than usual   Complete by: As directed       Allergies as of 11/04/2021       Reactions   Penicillins Rash        Medication List     TAKE these medications  aspirin 81 MG chewable tablet Chew by mouth daily.   prenatal multivitamin Tabs tablet Take 1 tablet by mouth daily at 12 noon.         Total time spent taking care of this patient: triaged remotely  Signed: Malachy Mood 11/04/2021, 12:27 PM

## 2021-11-04 NOTE — Progress Notes (Signed)
Discharge instructions reviewed, patient verbalizes understanding. Patient ambulatory home by herself in good condition.

## 2021-11-04 NOTE — OB Triage Note (Signed)
Patient is G8P5, 35w that presents with contractions that started 2 days ago. Patient states that contractions are 7/10. Patient denies LOF/bleeding. Patient states +FM. VSS. Monitors are applied and assessing.

## 2022-12-05 ENCOUNTER — Telehealth: Payer: Self-pay

## 2022-12-05 NOTE — Telephone Encounter (Signed)
Patient called and lvm about having some brownish-pinkish spotting on 01/08. Today she reports that the spotting is brown. She denies pain. I tried to call patient back about her concerns. No answer. No vm.

## 2023-10-05 ENCOUNTER — Other Ambulatory Visit: Payer: Self-pay

## 2023-10-05 ENCOUNTER — Emergency Department
Admission: EM | Admit: 2023-10-05 | Discharge: 2023-10-05 | Disposition: A | Discharge status: Home / Self Care | Payer: Medicaid Other | Attending: Emergency Medicine | Admitting: Emergency Medicine

## 2023-10-05 DIAGNOSIS — R1030 Lower abdominal pain, unspecified: Secondary | ICD-10-CM | POA: Diagnosis present

## 2023-10-05 DIAGNOSIS — I10 Essential (primary) hypertension: Secondary | ICD-10-CM | POA: Insufficient documentation

## 2023-10-05 DIAGNOSIS — N390 Urinary tract infection, site not specified: Secondary | ICD-10-CM | POA: Insufficient documentation

## 2023-10-05 LAB — URINALYSIS, ROUTINE W REFLEX MICROSCOPIC
Bilirubin Urine: NEGATIVE
Glucose, UA: NEGATIVE mg/dL
Ketones, ur: NEGATIVE mg/dL
Nitrite: NEGATIVE
Protein, ur: 30 mg/dL — AB
Specific Gravity, Urine: 1.018 (ref 1.005–1.030)
pH: 7 (ref 5.0–8.0)

## 2023-10-05 LAB — COMPREHENSIVE METABOLIC PANEL
ALT: 18 U/L (ref 0–44)
AST: 17 U/L (ref 15–41)
Albumin: 4 g/dL (ref 3.5–5.0)
Alkaline Phosphatase: 54 U/L (ref 38–126)
Anion gap: 8 (ref 5–15)
BUN: 15 mg/dL (ref 6–20)
CO2: 25 mmol/L (ref 22–32)
Calcium: 9.4 mg/dL (ref 8.9–10.3)
Chloride: 105 mmol/L (ref 98–111)
Creatinine, Ser: 0.69 mg/dL (ref 0.44–1.00)
GFR, Estimated: >60 mL/min (ref >60)
Glucose, Bld: 104 mg/dL — ABNORMAL HIGH (ref 70–99)
Potassium: 3.5 mmol/L (ref 3.5–5.1)
Sodium: 138 mmol/L (ref 135–145)
Total Bilirubin: 0.4 mg/dL (ref <1.2)
Total Protein: 6.9 g/dL (ref 6.5–8.1)

## 2023-10-05 LAB — CBC
HCT: 34.3 % — ABNORMAL LOW (ref 36.0–46.0)
Hemoglobin: 12 g/dL (ref 12.0–15.0)
MCH: 27.9 pg (ref 26.0–34.0)
MCHC: 35 g/dL (ref 30.0–36.0)
MCV: 79.8 fL — ABNORMAL LOW (ref 80.0–100.0)
Platelets: 243 10*3/uL (ref 150–400)
RBC: 4.3 MIL/uL (ref 3.87–5.11)
RDW: 13.6 % (ref 11.5–15.5)
WBC: 10.7 10*3/uL — ABNORMAL HIGH (ref 4.0–10.5)
nRBC: 0 % (ref 0.0–0.2)

## 2023-10-05 LAB — POC URINE PREG, ED: Preg Test, Ur: NEGATIVE

## 2023-10-05 LAB — LIPASE, BLOOD: Lipase: 24 U/L (ref 11–51)

## 2023-10-05 MED ORDER — ONDANSETRON 4 MG PO TBDP
4.0000 mg | ORAL_TABLET | Freq: Once | ORAL | Status: AC
  Administered 2023-10-05: 4 mg via ORAL
  Filled 2023-10-05: qty 1

## 2023-10-05 MED ORDER — CEFUROXIME AXETIL 500 MG PO TABS
500.0000 mg | ORAL_TABLET | Freq: Once | ORAL | Status: AC
  Administered 2023-10-05: 500 mg via ORAL
  Filled 2023-10-05: qty 1

## 2023-10-05 MED ORDER — IBUPROFEN 600 MG PO TABS
600.0000 mg | ORAL_TABLET | Freq: Once | ORAL | Status: AC
  Administered 2023-10-05: 600 mg via ORAL
  Filled 2023-10-05: qty 1

## 2023-10-05 MED ORDER — ONDANSETRON 4 MG PO TBDP: 4.0000 mg | ORAL_TABLET | Freq: Four times a day (QID) | ORAL | 0 refills | Status: DC | PRN

## 2023-10-05 MED ORDER — CEFUROXIME AXETIL 500 MG PO TABS: 500.0000 mg | ORAL_TABLET | Freq: Two times a day (BID) | ORAL | 0 refills | Status: DC

## 2023-10-05 MED ORDER — PHENAZOPYRIDINE HCL 200 MG PO TABS
200.0000 mg | ORAL_TABLET | Freq: Once | ORAL | Status: AC
  Administered 2023-10-05: 200 mg via ORAL
  Filled 2023-10-05: qty 1

## 2023-10-05 NOTE — Discharge Instructions (Addendum)
? ?  Please return to the emergency room right away if you are to develop a fever, severe nausea, your pain becomes severe or worsens, you are unable to keep food down, begin vomiting any dark or bloody fluid, you develop any dark or bloody stools, feel dehydrated, or other new concerns or symptoms arise. ? ?

## 2023-10-05 NOTE — ED Provider Notes (Signed)
[facility] Provider Note    Event Date/Time   First MD Initiated Contact with Patient 10/05/23 1011     (approximate)   History   Abdominal Pain and Emesis   HPI  Patricia Henson is a 32 y.o. 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  She notices yesterday her urine looked a little different and it color, and she has a frequent urgency to urinate feels like her bladder is not really fully emptying and is wondering if she might have a urinary tract infection.  She denies any severe pain she reports the pain almost feels like cramps that happen in the lower abdomen.  No back pain no fevers or chills.  Patient also relates that she does not have additional time for evaluation today.  She needs to be at work by noon.  She advises her employer will not accept a work note or doctor's excuse and this is extremely important to her that she come to work.      Physical Exam   Triage Vital Signs: ED Triage Vitals  Encounter Vitals Group     BP 10/05/23 0910 (!) 146/94     Systolic BP Percentile --      Diastolic BP Percentile --      Pulse Rate 10/05/23 0910 96     Resp 10/05/23 0910 20     Temp 10/05/23 0910 98.9 F (37.2 C)     Temp Source 10/05/23 0910 Oral     SpO2 10/05/23 0910 100 %     Weight 10/05/23 0908 190 lb (86.2 kg)     Height 10/05/23 0908 5\' 2"  (1.575 m)     Head Circumference --      Peak Flow --      Pain Score 10/05/23 0908 10     Pain Loc --      Pain Education --      Exclude from Growth Chart --     Most recent vital signs: Vitals:   10/05/23 0910  BP: (!) 146/94  Pulse: 96  Resp: 20  Temp: 98.9 F (37.2 C)  SpO2: 100%     General: Awake, no distress.  Pleasant, sits upright at the side of the bed without distress. CV:  Good peripheral perfusion.  Normal tones Resp:  Normal effort.  Abd:  No distention.   Soft nontender nondistended throughout with exception of the suprapubic region where she reports a crampy discomfort, also slight in the left lower quadrant.  No rebound or guarding.  No evidence of acute peritonitis.  No costovertebral angle tenderness to percussion bilaterally.  She denies acute gynecologic symptoms such as discharge or vaginal pain.  She appears comfortable, resting, and uses phone without difficulty, is very pleasant and amicable to discuss concerns with Other:  Warm well-perfused   ED Results / Procedures / Treatments   Labs (all labs ordered are listed, but only abnormal results are displayed) Labs Reviewed  COMPREHENSIVE METABOLIC PANEL - Abnormal; Notable for the following components:      Result Value   Glucose, Bld 104 (*)    All other components within normal limits  CBC - Abnormal; Notable for the following components:   WBC 10.7 (*)    HCT 34.3 (*)    MCV 79.8 (*)    All other components within normal limits  URINALYSIS, ROUTINE W REFLEX MICROSCOPIC -  Abnormal; Notable for the following components:   Color, Urine YELLOW (*)    APPearance HAZY (*)    Hgb urine dipstick MODERATE (*)    Protein, ur 30 (*)    Leukocytes,Ua TRACE (*)    Bacteria, UA RARE (*)    All other components within normal limits  LIPASE, BLOOD  POC URINE PREG, ED   Labs are notable for negative pregnancy test.  Very mild leukocytosis.  Comprehensive metabolic panel is normal urinalysis concerning for infection with bacteria white blood cells leukocytes present.  EKG     RADIOLOGY     PROCEDURES:  Critical Care performed: No  Procedures   MEDICATIONS ORDERED IN ED: Medications - No data to display   IMPRESSION / MDM / ASSESSMENT AND PLAN / ED COURSE  I reviewed the triage vital signs and the nursing notes.                              Differential diagnosis includes, but is not limited to, urinary tract infection, kidney stone, diverticulitis, gynecologic, PID,  GC chlamydia, etc.  She denies acute gynecologic symptoms or vaginal discharge which seems to make likelihood of GYN cause much lower.  She does report dysuria, suprapubic discomfort and crampiness.  I suspect most likely based on her urinalysis this is a urinary tract infection.  Patient's presentation is most consistent with acute complicated illness / injury requiring diagnostic workup.  I did discuss very specifically with Mrs. Blonder that I would be happy to provide a work note, that further testing could be required especially if she does not improve with treatment with antibiotic for her symptoms worsened.  She is very understanding this.  I think at this point is most likely that she has a urinary tract infection, and appropriate treatment with oral antibiotics and as needed antiemetic seems reasonable.  She does have tenderness in the suprapubic and left lower quadrant region but obvious urinary symptoms no acute gynecologic symptoms or severe sharp pain noted be suggestive of acute ovarian rupture torsion or obvious acute gynecologic process.  And given the patient's need to be at work by noon, we discussed further that if she is worsening, symptoms or not improving, she starts having fevers vomiting severe pain or other concerns arise she should come back to the ER right away.  She is very agreeable and amenable with this.  Will give her first dose of medication antibiotic here today to get treatment started as she prepares to go to work today  She does not currently have a primary care doctor to follow-up with, I have placed a referral.  She understands she can return to the ER at any time.  With shared medical decision making and keeping with her own goals to be certain to make it to work in June, with limited time for evaluation, and having discussed careful return precautions I am confident that she will return if concerning symptoms arise or if worsening.  Again, my impression is likely urinary  tract infection of an uncomplicated nature at this time for which it should improve with antibiotics nonetheless.  Difficult to exclude items such as unlikely but left-sided appendicitis, diverticulitis, ovarian process though low on the differential etc.  There is no associated peritonitis or evidence of acute abdomen.  Her workup thus far has been reassuring  Return precautions and treatment recommendations and follow-up discussed with the patient who is agreeable with the plan.  FINAL CLINICAL IMPRESSION(S) / ED DIAGNOSES   Final diagnoses:  None     Rx / DC Orders   ED Discharge Orders     None        Note:  This document was prepared using Dragon voice recognition software and may include unintentional dictation errors.   Sharyn Creamer, MD 10/05/23 [phone]

## 2023-10-05 NOTE — ED Triage Notes (Signed)
Pt reports pain to LLQ since yesterday and some vomiting. Pt reports pain was intermittent but is now constant. Pt reports pain is crampy in nature. Last BM was yesterday, and was loose. Denies vaginal discharge, is sexually active and uses protection.

## 2023-10-13 ENCOUNTER — Other Ambulatory Visit: Payer: Self-pay

## 2023-10-13 ENCOUNTER — Encounter: Payer: Self-pay | Admitting: Radiology

## 2023-10-13 ENCOUNTER — Emergency Department
Admission: EM | Admit: 2023-10-13 | Discharge: 2023-10-13 | Disposition: A | Discharge status: Home / Self Care | Payer: Medicaid Other | Attending: Emergency Medicine | Admitting: Emergency Medicine

## 2023-10-13 ENCOUNTER — Emergency Department: Payer: Medicaid Other

## 2023-10-13 DIAGNOSIS — N200 Calculus of kidney: Secondary | ICD-10-CM

## 2023-10-13 DIAGNOSIS — R109 Unspecified abdominal pain: Secondary | ICD-10-CM | POA: Diagnosis present

## 2023-10-13 DIAGNOSIS — N132 Hydronephrosis with renal and ureteral calculous obstruction: Secondary | ICD-10-CM | POA: Diagnosis not present

## 2023-10-13 LAB — URINALYSIS, ROUTINE W REFLEX MICROSCOPIC
Bacteria, UA: NONE SEEN
Bilirubin Urine: NEGATIVE
Glucose, UA: NEGATIVE mg/dL
Ketones, ur: NEGATIVE mg/dL
Nitrite: NEGATIVE
Protein, ur: NEGATIVE mg/dL
Specific Gravity, Urine: 1.013 (ref 1.005–1.030)
pH: 6 (ref 5.0–8.0)

## 2023-10-13 LAB — CBC WITH DIFFERENTIAL/PLATELET
Abs Immature Granulocytes: 0.03 10*3/uL (ref 0.00–0.07)
Basophils Absolute: 0 10*3/uL (ref 0.0–0.1)
Basophils Relative: 0 %
Eosinophils Absolute: 0.2 10*3/uL (ref 0.0–0.5)
Eosinophils Relative: 2 %
HCT: 34.5 % — ABNORMAL LOW (ref 36.0–46.0)
Hemoglobin: 12.1 g/dL (ref 12.0–15.0)
Immature Granulocytes: 0 %
Lymphocytes Relative: 28 %
Lymphs Abs: 3 10*3/uL (ref 0.7–4.0)
MCH: 28.5 pg (ref 26.0–34.0)
MCHC: 35.1 g/dL (ref 30.0–36.0)
MCV: 81.4 fL (ref 80.0–100.0)
Monocytes Absolute: 0.5 10*3/uL (ref 0.1–1.0)
Monocytes Relative: 4 %
Neutro Abs: 6.8 10*3/uL (ref 1.7–7.7)
Neutrophils Relative %: 66 %
Platelets: 259 10*3/uL (ref 150–400)
RBC: 4.24 MIL/uL (ref 3.87–5.11)
RDW: 13.5 % (ref 11.5–15.5)
WBC: 10.5 10*3/uL (ref 4.0–10.5)
nRBC: 0 % (ref 0.0–0.2)

## 2023-10-13 LAB — BASIC METABOLIC PANEL
Anion gap: 6 (ref 5–15)
BUN: 17 mg/dL (ref 6–20)
CO2: 24 mmol/L (ref 22–32)
Calcium: 9.4 mg/dL (ref 8.9–10.3)
Chloride: 108 mmol/L (ref 98–111)
Creatinine, Ser: 0.79 mg/dL (ref 0.44–1.00)
GFR, Estimated: >60 mL/min (ref >60)
Glucose, Bld: 101 mg/dL — ABNORMAL HIGH (ref 70–99)
Potassium: 4 mmol/L (ref 3.5–5.1)
Sodium: 138 mmol/L (ref 135–145)

## 2023-10-13 LAB — POC URINE PREG, ED: Preg Test, Ur: NEGATIVE

## 2023-10-13 MED ORDER — KETOROLAC TROMETHAMINE 10 MG PO TABS: 10.0000 mg | ORAL_TABLET | Freq: Four times a day (QID) | ORAL | 0 refills | Status: DC | PRN

## 2023-10-13 MED ORDER — ONDANSETRON HCL 4 MG/2ML IJ SOLN
4.0000 mg | Freq: Once | INTRAMUSCULAR | Status: AC
  Administered 2023-10-13: 4 mg via INTRAVENOUS
  Filled 2023-10-13: qty 2

## 2023-10-13 MED ORDER — IOHEXOL 300 MG/ML  SOLN
100.0000 mL | Freq: Once | INTRAMUSCULAR | Status: AC | PRN
  Administered 2023-10-13: 100 mL via INTRAVENOUS

## 2023-10-13 MED ORDER — MORPHINE SULFATE (PF) 4 MG/ML IV SOLN
4.0000 mg | Freq: Once | INTRAVENOUS | Status: DC
  Filled 2023-10-13: qty 1

## 2023-10-13 MED ORDER — TAMSULOSIN HCL 0.4 MG PO CAPS: 0.4000 mg | ORAL_CAPSULE | Freq: Every day | ORAL | 0 refills | Status: DC

## 2023-10-13 MED ORDER — KETOROLAC TROMETHAMINE 15 MG/ML IJ SOLN
15.0000 mg | Freq: Once | INTRAMUSCULAR | Status: AC
  Administered 2023-10-13: 15 mg via INTRAVENOUS
  Filled 2023-10-13: qty 1

## 2023-10-13 MED ORDER — OXYCODONE-ACETAMINOPHEN 5-325 MG PO TABS: 1.0000 | ORAL_TABLET | ORAL | 0 refills | Status: DC | PRN

## 2023-10-13 NOTE — ED Notes (Signed)
Pt d/c home per EDP order. Discharge summary reviewed, verbalize understanding. NAD. Ambulatory

## 2023-10-13 NOTE — ED Triage Notes (Signed)
Pt here with left side flank pain that is getting worse. Pt states pain does not radiate. Pt endorses nausea.

## 2023-10-13 NOTE — ED Provider Notes (Signed)
[facility] Provider Note    Event Date/Time   First MD Initiated Contact with Patient 10/13/23 0957     (approximate)   History   Flank Pain   HPI  Patricia Henson is a 32 y.o. female with history of cholecystectomy presents emergency department complaining of left sided abdominal pain.  Patient states been ongoing for about a week.  Was seen last week.  Diagnosed with UTI.  States the pain has not gotten any better.  Has been taking the medication.  States she did not finish the antibiotic.  No fever or chills.  No vomiting or diarrhea.  Last bowel movement was yesterday and it was normal.  No blood noted in the stool.      Physical Exam   Triage Vital Signs: ED Triage Vitals  Encounter Vitals Group     BP 10/13/23 0937 (!) 150/92     Systolic BP Percentile --      Diastolic BP Percentile --      Pulse Rate 10/13/23 0936 81     Resp 10/13/23 0936 17     Temp 10/13/23 0936 98.7 F (37.1 C)     Temp Source 10/13/23 0936 Oral     SpO2 10/13/23 0936 99 %     Weight 10/13/23 0936 190 lb 0.6 oz (86.2 kg)     Height 10/13/23 0936 5\' 2"  (1.575 m)     Head Circumference --      Peak Flow --      Pain Score 10/13/23 0936 10     Pain Loc --      Pain Education --      Exclude from Growth Chart --     Most recent vital signs: Vitals:   10/13/23 0937 10/13/23 1438  BP: (!) 150/92 131/74  Pulse:  78  Resp:  18  Temp:  98.2 F (36.8 C)  SpO2:  99%     General: Awake, no distress.   CV:  Good peripheral perfusion. regular rate and  rhythm Resp:  Normal effort. Lungs cta Abd:  No distention.  Abdomen tender along the left upper mid epigastric and towards the left flank, bowel sounds normal Other:      ED Results / Procedures / Treatments   Labs (all labs ordered are listed, but only abnormal results are displayed) Labs Reviewed  URINALYSIS, ROUTINE W REFLEX MICROSCOPIC - Abnormal; Notable for the following components:      Result Value    Color, Urine YELLOW (*)    APPearance CLEAR (*)    Hgb urine dipstick SMALL (*)    Leukocytes,Ua SMALL (*)    All other components within normal limits  BASIC METABOLIC PANEL - Abnormal; Notable for the following components:   Glucose, Bld 101 (*)    All other components within normal limits  CBC WITH DIFFERENTIAL/PLATELET - Abnormal; Notable for the following components:   HCT 34.5 (*)    All other components within normal limits  POC URINE PREG, ED     EKG     RADIOLOGY CT abdomen pelvis IV contrast    PROCEDURES:   Procedures   MEDICATIONS ORDERED IN ED: Medications  morphine (PF) 4 MG/ML injection 4 mg (4 mg Intravenous Not Given 10/13/23 1030)  ondansetron (ZOFRAN) injection 4 mg (4 mg Intravenous Given 10/13/23 1032)  ketorolac (TORADOL) 15 MG/ML injection 15 mg (15 mg Intravenous Given 10/13/23 1032)  iohexol (OMNIPAQUE) 300 MG/ML solution 100 mL (100 mLs Intravenous  Contrast Given 10/13/23 1207)     IMPRESSION / MDM / ASSESSMENT AND PLAN / ED COURSE  I reviewed the triage vital signs and the nursing notes.                              Differential diagnosis includes, but is not limited to, bowel obstruction, kidney stone, pyelonephritis, diverticulitis, colitis, IBS  Patient's presentation is most consistent with acute illness / injury with system symptoms.   Labs and imaging ordered  Patient requesting toradol, toradol 15mg  iv and zofran 4 mg iv ordered   Labs are reassuring  CT renal stone study, independently reviewed interpreted by me as having a kidney stone.  Patient needed to leave prior to the radiologist report.  She appears to be stable and is feeling better after the Toradol.  Will have her follow-up with urology.  I now will call her with the test results.  She is in agreement with this treatment plan.  Discharged stable condition.  CT renal stone study radiologist does comment on a 6 mm x 11 mm stone that is mid ureter.  I called the  patient to notify her that it is indeed a very large stone.  She should follow-up with urology as previously instructed.  Return emergency department worsening.  She is in agreement treatment plan.   FINAL CLINICAL IMPRESSION(S) / ED DIAGNOSES   Final diagnoses:  Kidney stone     Rx / DC Orders   ED Discharge Orders          Ordered    ketorolac (TORADOL) 10 MG tablet  Every 6 hours PRN        10/13/23 1434    oxyCODONE-acetaminophen (PERCOCET) 5-325 MG tablet  Every 4 hours PRN        10/13/23 1434    tamsulosin (FLOMAX) 0.4 MG CAPS capsule  Daily        10/13/23 1434             Note:  This document was prepared using Dragon voice recognition software and may include unintentional dictation errors.    Faythe Ghee, PA-C 10/13/23 1603    Jene Every, MD 10/16/23 [phone]

## 2024-01-01 ENCOUNTER — Encounter: Payer: Self-pay | Admitting: Intensive Care

## 2024-01-01 ENCOUNTER — Emergency Department
Admission: EM | Admit: 2024-01-01 | Discharge: 2024-01-01 | Disposition: A | Discharge status: Home / Self Care | Payer: Medicaid Other | Attending: Emergency Medicine | Admitting: Emergency Medicine

## 2024-01-01 ENCOUNTER — Emergency Department: Payer: Medicaid Other

## 2024-01-01 ENCOUNTER — Other Ambulatory Visit: Payer: Self-pay

## 2024-01-01 DIAGNOSIS — D72829 Elevated white blood cell count, unspecified: Secondary | ICD-10-CM | POA: Insufficient documentation

## 2024-01-01 DIAGNOSIS — N200 Calculus of kidney: Secondary | ICD-10-CM

## 2024-01-01 DIAGNOSIS — R110 Nausea: Secondary | ICD-10-CM | POA: Insufficient documentation

## 2024-01-01 DIAGNOSIS — N202 Calculus of kidney with calculus of ureter: Secondary | ICD-10-CM | POA: Insufficient documentation

## 2024-01-01 DIAGNOSIS — R1032 Left lower quadrant pain: Secondary | ICD-10-CM | POA: Diagnosis present

## 2024-01-01 LAB — LIPASE, BLOOD: Lipase: 25 U/L (ref 11–51)

## 2024-01-01 LAB — CBC WITH DIFFERENTIAL/PLATELET
Abs Immature Granulocytes: 0.08 10*3/uL — ABNORMAL HIGH (ref 0.00–0.07)
Basophils Absolute: 0 10*3/uL (ref 0.0–0.1)
Basophils Relative: 0 %
Eosinophils Absolute: 0.1 10*3/uL (ref 0.0–0.5)
Eosinophils Relative: 1 %
HCT: 35.3 % — ABNORMAL LOW (ref 36.0–46.0)
Hemoglobin: 12.3 g/dL (ref 12.0–15.0)
Immature Granulocytes: 1 %
Lymphocytes Relative: 21 %
Lymphs Abs: 2.8 10*3/uL (ref 0.7–4.0)
MCH: 27.5 pg (ref 26.0–34.0)
MCHC: 34.8 g/dL (ref 30.0–36.0)
MCV: 78.8 fL — ABNORMAL LOW (ref 80.0–100.0)
Monocytes Absolute: 0.6 10*3/uL (ref 0.1–1.0)
Monocytes Relative: 5 %
Neutro Abs: 9.8 10*3/uL — ABNORMAL HIGH (ref 1.7–7.7)
Neutrophils Relative %: 72 %
Platelets: 290 10*3/uL (ref 150–400)
RBC: 4.48 MIL/uL (ref 3.87–5.11)
RDW: 14.5 % (ref 11.5–15.5)
WBC: 13.4 10*3/uL — ABNORMAL HIGH (ref 4.0–10.5)
nRBC: 0 % (ref 0.0–0.2)

## 2024-01-01 LAB — URINALYSIS, ROUTINE W REFLEX MICROSCOPIC
Bacteria, UA: NONE SEEN
Bilirubin Urine: NEGATIVE
Glucose, UA: NEGATIVE mg/dL
Ketones, ur: NEGATIVE mg/dL
Leukocytes,Ua: NEGATIVE
Nitrite: NEGATIVE
Protein, ur: 100 mg/dL — AB
RBC / HPF: >50 RBC/hpf (ref 0–5)
Specific Gravity, Urine: 1.026 (ref 1.005–1.030)
pH: 7 (ref 5.0–8.0)

## 2024-01-01 LAB — COMPREHENSIVE METABOLIC PANEL
ALT: 18 U/L (ref 0–44)
AST: 18 U/L (ref 15–41)
Albumin: 4.3 g/dL (ref 3.5–5.0)
Alkaline Phosphatase: 49 U/L (ref 38–126)
Anion gap: 13 (ref 5–15)
BUN: 20 mg/dL (ref 6–20)
CO2: 26 mmol/L (ref 22–32)
Calcium: 9.4 mg/dL (ref 8.9–10.3)
Chloride: 101 mmol/L (ref 98–111)
Creatinine, Ser: 0.85 mg/dL (ref 0.44–1.00)
GFR, Estimated: >60 mL/min (ref >60)
Glucose, Bld: 119 mg/dL — ABNORMAL HIGH (ref 70–99)
Potassium: 3.5 mmol/L (ref 3.5–5.1)
Sodium: 140 mmol/L (ref 135–145)
Total Bilirubin: 0.5 mg/dL (ref 0.0–1.2)
Total Protein: 7.5 g/dL (ref 6.5–8.1)

## 2024-01-01 LAB — PREGNANCY, URINE: Preg Test, Ur: NEGATIVE

## 2024-01-01 MED ORDER — OXYCODONE HCL 5 MG PO TABS: 5.0000 mg | ORAL_TABLET | Freq: Three times a day (TID) | ORAL | 0 refills | Status: DC | PRN

## 2024-01-01 MED ORDER — ONDANSETRON HCL 4 MG/2ML IJ SOLN
4.0000 mg | Freq: Once | INTRAMUSCULAR | Status: AC
  Administered 2024-01-01: 4 mg via INTRAVENOUS
  Filled 2024-01-01: qty 2

## 2024-01-01 MED ORDER — SODIUM CHLORIDE 0.9 % IV BOLUS
1000.0000 mL | Freq: Once | INTRAVENOUS | Status: AC
  Administered 2024-01-01: 1000 mL via INTRAVENOUS

## 2024-01-01 MED ORDER — KETOROLAC TROMETHAMINE 15 MG/ML IJ SOLN
15.0000 mg | Freq: Once | INTRAMUSCULAR | Status: AC
  Administered 2024-01-01: 15 mg via INTRAVENOUS
  Filled 2024-01-01: qty 1

## 2024-01-01 NOTE — ED Notes (Signed)
 Heat pack wrapped in pillow case given to patient

## 2024-01-01 NOTE — ED Provider Notes (Signed)
 [facility] Provider Note    Event Date/Time   First MD Initiated Contact with Patient 01/01/24 2045     (approximate)   History   Abdominal Pain   HPI Patricia Henson is a 33 y.o. female presenting today for left flank pain.  Patient states sharp onset of pain this evening around 7 PM.  Goes from her left flank down to her left groin.  Also noted blood in her urine earlier today.  Had associated nausea and vomiting.  No right sided abdominal pain.  No fevers or chills.  No chest pain difficulty breathing.  No diarrhea or vaginal pain.  No prior history of kidney stones or other abdominal surgeries.     Physical Exam   Triage Vital Signs: ED Triage Vitals  Encounter Vitals Group     BP 01/01/24 1852 (!) 156/99     Systolic BP Percentile --      Diastolic BP Percentile --      Pulse Rate 01/01/24 1852 95     Resp 01/01/24 1852 16     Temp 01/01/24 1852 98.6 F (37 C)     Temp Source 01/01/24 1852 Oral     SpO2 01/01/24 1852 100 %     Weight 01/01/24 1853 190 lb (86.2 kg)     Height 01/01/24 1853 5' 2 (1.575 m)     Head Circumference --      Peak Flow --      Pain Score 01/01/24 1853 10     Pain Loc --      Pain Education --      Exclude from Growth Chart --     Most recent vital signs: Vitals:   01/01/24 1852  BP: (!) 156/99  Pulse: 95  Resp: 16  Temp: 98.6 F (37 C)  SpO2: 100%   Physical Exam: I have reviewed the vital signs and nursing notes. General: Awake, alert, in acute pain Head:  Atraumatic, normocephalic.   ENT:  EOM intact, PERRL. Oral mucosa is pink and moist with no lesions. Neck: Neck is supple with full range of motion, No meningeal signs. Cardiovascular:  RRR, No murmurs. Peripheral pulses palpable and equal bilaterally. Respiratory:  Symmetrical chest wall expansion.  No rhonchi, rales, or wheezes.  Good air movement throughout.  No use of accessory muscles.   Musculoskeletal:  No cyanosis or edema. Moving  extremities with full ROM Abdomen:  Soft, nontender, nondistended.  Left-sided CVA tenderness Neuro:  GCS 15, moving all four extremities, interacting appropriately. Speech clear. Psych:  Calm, appropriate.   Skin:  Warm, dry, no rash.    ED Results / Procedures / Treatments   Labs (all labs ordered are listed, but only abnormal results are displayed) Labs Reviewed  COMPREHENSIVE METABOLIC PANEL - Abnormal; Notable for the following components:      Result Value   Glucose, Bld 119 (*)    All other components within normal limits  CBC WITH DIFFERENTIAL/PLATELET - Abnormal; Notable for the following components:   WBC 13.4 (*)    HCT 35.3 (*)    MCV 78.8 (*)    Neutro Abs 9.8 (*)    Abs Immature Granulocytes 0.08 (*)    All other components within normal limits  URINALYSIS, ROUTINE W REFLEX MICROSCOPIC - Abnormal; Notable for the following components:   Color, Urine YELLOW (*)    APPearance CLOUDY (*)    Hgb urine dipstick LARGE (*)    Protein, ur 100 (*)  All other components within normal limits  LIPASE, BLOOD  PREGNANCY, URINE     EKG    RADIOLOGY Independently interpreted CT imaging showing evidence of left-sided kidney stone without more concerning features   PROCEDURES:  Critical Care performed: No  Procedures   MEDICATIONS ORDERED IN ED: Medications  ketorolac  (TORADOL ) 15 MG/ML injection 15 mg (15 mg Intravenous Given 01/01/24 2136)  sodium chloride  0.9 % bolus 1,000 mL (1,000 mLs Intravenous New Bag/Given 01/01/24 2136)  ondansetron  (ZOFRAN ) injection 4 mg (4 mg Intravenous Given 01/01/24 2136)     IMPRESSION / MDM / ASSESSMENT AND PLAN / ED COURSE  I reviewed the triage vital signs and the nursing notes.                              Differential diagnosis includes, but is not limited to, nephrolithiasis, diverticulitis, acute cystitis, pyelonephritis  Patient's presentation is most consistent with acute complicated illness / injury requiring  diagnostic workup.  Patient is a 33 year old female presenting today for left flank pain and hematuria.  Notably in pain on arrival and was given Toradol , Zofran , and fluids.  Laboratory workup notable for hematuria but no evidence of UTI.  CMP and lipase otherwise normal.  Negative pregnancy test.  Mild leukocytosis.  CT imaging confirmed 6 mm kidney stone in the left distal ureter.  No further obstruction or renal impairment.  Patient was reassessed with complete resolution of all symptoms.  Will discharge at this time with outpatient follow-up as needed with urology.  Discussed ibuprofen  use for pain control and send as needed oxycodone  for more severe symptoms.  Was given strict return precautions.  Clinical Course as of 01/01/24 2247  Thu Jan 01, 2024  2244 Patient with complete resolution of pain symptoms.  Feeling very comfortable and safe with discharge at this time. [DW]    Clinical Course User Index [DW] Malvina Alm DASEN, MD     FINAL CLINICAL IMPRESSION(S) / ED DIAGNOSES   Final diagnoses:  Kidney stone     Rx / DC Orders   ED Discharge Orders          Ordered    oxyCODONE  (ROXICODONE ) 5 MG immediate release tablet  Every 8 hours PRN        01/01/24 2247             Note:  This document was prepared using Dragon voice recognition software and may include unintentional dictation errors.   Malvina Alm DASEN, MD 01/01/24 2250

## 2024-01-01 NOTE — Discharge Instructions (Addendum)
 I have sent additional medication to your pharmacy for you to take as needed for severe pain.  You can take ibuprofen  600 mg every 8 hours starting tomorrow morning for the next 3 days to help with pain as well.  Please return to the emergency department if you cannot tolerate the symptoms even with all these pain medications.  Do not take the oxycodone  if you are planning to drive as it can make you drowsy.

## 2024-01-01 NOTE — ED Triage Notes (Signed)
 Patient c/o abdominal pain and blood in urine that started this AM.  History kidney stones

## 2024-01-01 NOTE — ED Notes (Signed)
 Patient discharged from ED by provider. Discharge instructions reviewed with patient and all questions answered. Patient ambulatory from ED in NAD.

## 2024-01-01 NOTE — ED Provider Triage Note (Signed)
 Emergency Medicine Provider Triage Evaluation Note  Patricia Henson , a 33 y.o. female  was evaluated in triage.  Pt complains of abdominal pain, noticed blood in her urine this morning. Pains started last night. It has been intermittent.  Review of Systems  Positive: LLQ pain, diarrhea and constipation, urinary frequency Negative: dysuria  Physical Exam  There were no vitals taken for this visit. Gen:   Awake, no distress   Resp:  Normal effort  MSK:   Moves extremities without difficulty  Other:    Medical Decision Making  Medically screening exam initiated at 6:53 PM.  Appropriate orders placed.  [name] was informed that the remainder of the evaluation will be completed by another provider, this initial triage assessment does not replace that evaluation, and the importance of remaining in the ED until their evaluation is complete.     Cleaster Tinnie LABOR, PA-C 01/01/24 [phone]

## 2024-05-24 ENCOUNTER — Emergency Department

## 2024-05-24 ENCOUNTER — Encounter: Payer: Self-pay | Admitting: Intensive Care

## 2024-05-24 ENCOUNTER — Emergency Department
Admission: EM | Admit: 2024-05-24 | Discharge: 2024-05-24 | Disposition: A | Discharge status: Home / Self Care | Attending: Emergency Medicine | Admitting: Emergency Medicine

## 2024-05-24 ENCOUNTER — Other Ambulatory Visit: Payer: Self-pay

## 2024-05-24 DIAGNOSIS — N132 Hydronephrosis with renal and ureteral calculous obstruction: Secondary | ICD-10-CM | POA: Insufficient documentation

## 2024-05-24 DIAGNOSIS — R109 Unspecified abdominal pain: Secondary | ICD-10-CM

## 2024-05-24 DIAGNOSIS — D649 Anemia, unspecified: Secondary | ICD-10-CM | POA: Insufficient documentation

## 2024-05-24 DIAGNOSIS — R1032 Left lower quadrant pain: Secondary | ICD-10-CM | POA: Diagnosis present

## 2024-05-24 DIAGNOSIS — D72829 Elevated white blood cell count, unspecified: Secondary | ICD-10-CM | POA: Insufficient documentation

## 2024-05-24 DIAGNOSIS — N200 Calculus of kidney: Secondary | ICD-10-CM

## 2024-05-24 LAB — URINALYSIS, ROUTINE W REFLEX MICROSCOPIC
Bilirubin Urine: NEGATIVE
Glucose, UA: NEGATIVE mg/dL
Ketones, ur: 5 mg/dL — AB
Nitrite: NEGATIVE
Protein, ur: 100 mg/dL — AB
RBC / HPF: >50 RBC/hpf (ref 0–5)
Specific Gravity, Urine: 1.014 (ref 1.005–1.030)
WBC, UA: >50 WBC/hpf (ref 0–5)
pH: 8 (ref 5.0–8.0)

## 2024-05-24 LAB — BASIC METABOLIC PANEL WITH GFR
Anion gap: 8 (ref 5–15)
BUN: 19 mg/dL (ref 6–20)
CO2: 22 mmol/L (ref 22–32)
Calcium: 9.4 mg/dL (ref 8.9–10.3)
Chloride: 111 mmol/L (ref 98–111)
Creatinine, Ser: 0.87 mg/dL (ref 0.44–1.00)
GFR, Estimated: >60 mL/min (ref >60)
Glucose, Bld: 105 mg/dL — ABNORMAL HIGH (ref 70–99)
Potassium: 3.6 mmol/L (ref 3.5–5.1)
Sodium: 141 mmol/L (ref 135–145)

## 2024-05-24 LAB — CBC
HCT: 31.6 % — ABNORMAL LOW (ref 36.0–46.0)
Hemoglobin: 11 g/dL — ABNORMAL LOW (ref 12.0–15.0)
MCH: 27.1 pg (ref 26.0–34.0)
MCHC: 34.8 g/dL (ref 30.0–36.0)
MCV: 77.8 fL — ABNORMAL LOW (ref 80.0–100.0)
Platelets: 257 10*3/uL (ref 150–400)
RBC: 4.06 MIL/uL (ref 3.87–5.11)
RDW: 14.2 % (ref 11.5–15.5)
WBC: 13.4 10*3/uL — ABNORMAL HIGH (ref 4.0–10.5)
nRBC: 0 % (ref 0.0–0.2)

## 2024-05-24 LAB — POC URINE PREG, ED: Preg Test, Ur: NEGATIVE

## 2024-05-24 MED ORDER — ONDANSETRON HCL 4 MG/2ML IJ SOLN
4.0000 mg | Freq: Once | INTRAMUSCULAR | Status: AC
  Administered 2024-05-24: 4 mg via INTRAVENOUS
  Filled 2024-05-24: qty 2

## 2024-05-24 MED ORDER — CEPHALEXIN 500 MG PO CAPS: 500.0000 mg | ORAL_CAPSULE | Freq: Two times a day (BID) | ORAL | 0 refills | Status: AC

## 2024-05-24 MED ORDER — OXYCODONE-ACETAMINOPHEN 5-325 MG PO TABS: 1.0000 | ORAL_TABLET | ORAL | 0 refills | Status: AC | PRN

## 2024-05-24 MED ORDER — TAMSULOSIN HCL 0.4 MG PO CAPS: 0.4000 mg | ORAL_CAPSULE | Freq: Every day | ORAL | 0 refills | Status: AC

## 2024-05-24 MED ORDER — KETOROLAC TROMETHAMINE 15 MG/ML IJ SOLN
15.0000 mg | Freq: Once | INTRAMUSCULAR | Status: AC
  Administered 2024-05-24: 15 mg via INTRAVENOUS
  Filled 2024-05-24: qty 1

## 2024-05-24 MED ORDER — SODIUM CHLORIDE 0.9 % IV SOLN
1.0000 g | Freq: Once | INTRAVENOUS | Status: AC
  Administered 2024-05-24: 1 g via INTRAVENOUS
  Filled 2024-05-24: qty 10

## 2024-05-24 MED ORDER — IOHEXOL 300 MG/ML  SOLN
100.0000 mL | Freq: Once | INTRAMUSCULAR | Status: AC | PRN
  Administered 2024-05-24: 100 mL via INTRAVENOUS

## 2024-05-24 MED ORDER — ONDANSETRON 4 MG PO TBDP: 4.0000 mg | ORAL_TABLET | Freq: Three times a day (TID) | ORAL | 0 refills | Status: DC | PRN

## 2024-05-24 NOTE — Discharge Instructions (Addendum)
 You were seen in the emergency department and diagnosed with a kidney stone.  It is importantly follow-up closely with urology.  Return to the emergency department if you have worsening symptoms.  You are given a prescription for an antibiotic.  It is important that you call urology and schedule close follow-up appointment as an outpatient.  You have had this kidney stone for the past 7 months and is moving down your left ureter.  You are risking long-term damage to your left kidney and it is important that you follow-up to have your kidney stone removed.  Return to the emergency department for worsening symptoms.  Pain control:  Ibuprofen  (motrin /aleve/advil ) - You can take 3-4 tablets (600-800 mg) every 6 hours as needed for pain/fever.  Acetaminophen  (tylenol ) - You can take 2 extra strength tablets (1000 mg) every 6 hours as needed for pain/fever.  You can alternate these medications or take them together.  Make sure you eat food/drink water when taking these medications.  If you are still hurting you can take an opioid pain medication, only take if you are in severe pain.  Percocet - you were given a prescription for narcotic pain medications.   These are very addictive medications.  These medications can make you constipated.  If you need to take more than 1-2 doses, start a stool softner.  If you become constipated, take 1-2 capfull of MiraLAX mixed in 16 oz of water, can repeat untill having regular bowel movements.  Keep this medication out of reach of any children.  Flomax  (tamsulosin ) -this medication will make you urinate more frequently, it is importantly stay hydrated with water while taking this medication  zofran  (ondansetron ) - nausea medication, take 1 tablet every 8 hours as needed for nausea/vomiting.

## 2024-05-24 NOTE — ED Provider Notes (Addendum)
 [facility] Provider Note    Event Date/Time   First MD Initiated Contact with Patient 05/24/24 1216     (approximate)   History   Flank Pain   HPI  Patricia Henson is a 33 y.o. female presents to the emergency department with abdominal pain.  Patient states that she started having left-sided abdominal pain yesterday that worsened today.  Severe left-sided abdominal pain that she describes as a ongoing pain to the left side.  Associate with nausea and vomiting.  Denies any constipation or diarrhea.  No blood in her stool.  Denies dysuria, urinary urgency or frequency.  Denies concern for pregnancy.  Last menstrual cycle was on the 17th.  6 prior pregnancies.  No prior abdominal surgery.     Physical Exam   Triage Vital Signs: ED Triage Vitals [05/24/24 1157]  Encounter Vitals Group     BP 136/86     Girls Systolic BP Percentile      Girls Diastolic BP Percentile      Boys Systolic BP Percentile      Boys Diastolic BP Percentile      Pulse Rate 89     Resp 18     Temp 98 F (36.7 C)     Temp Source Oral     SpO2 100 %     Weight 193 lb (87.5 kg)     Height 5' 2 (1.575 m)     Head Circumference      Peak Flow      Pain Score 10     Pain Loc      Pain Education      Exclude from Growth Chart     Most recent vital signs: Vitals:   05/24/24 1157  BP: 136/86  Pulse: 89  Resp: 18  Temp: 98 F (36.7 C)  SpO2: 100%    Physical Exam Constitutional:      Appearance: She is well-developed.  HENT:     Head: Atraumatic.   Eyes:     Conjunctiva/sclera: Conjunctivae normal.    Cardiovascular:     Rate and Rhythm: Regular rhythm.  Pulmonary:     Effort: No respiratory distress.  Abdominal:     General: There is no distension.     Tenderness: There is abdominal tenderness.     Comments: Left lower quadrant abdominal tenderness to palpation with no rebound or guarding.  No CVA tenderness.   Musculoskeletal:        General: Normal  range of motion.     Cervical back: Normal range of motion.   Skin:    General: Skin is warm.     Capillary Refill: Capillary refill takes less than 2 seconds.   Neurological:     Mental Status: She is alert. Mental status is at baseline.     IMPRESSION / MDM / ASSESSMENT AND PLAN / ED COURSE  I reviewed the triage vital signs and the nursing notes.  Differential diagnosis including pyelonephritis, kidney stone, appendicitis, ovarian cyst, diverticulitis, intra-abdominal abscess  Given antiemetics, pain medication and IV fluids.  Plan for CT scan with contrast  RADIOLOGY I independently reviewed imaging, my interpretation of imaging: CT scan abdomen and pelvis with contrast -left-sided hydronephrosis Read as left-sided 10 mm kidney stone with questionable superimposed infectious process.  LABS (all labs ordered are listed, but only abnormal results are displayed) Labs interpreted as -    Labs Reviewed  URINALYSIS, ROUTINE W REFLEX MICROSCOPIC - Abnormal; Notable for  the following components:      Result Value   Color, Urine YELLOW (*)    APPearance CLOUDY (*)    Hgb urine dipstick MODERATE (*)    Ketones, ur 5 (*)    Protein, ur 100 (*)    Leukocytes,Ua LARGE (*)    Bacteria, UA RARE (*)    All other components within normal limits  BASIC METABOLIC PANEL WITH GFR - Abnormal; Notable for the following components:   Glucose, Bld 105 (*)    All other components within normal limits  CBC - Abnormal; Notable for the following components:   WBC 13.4 (*)    Hemoglobin 11.0 (*)    HCT 31.6 (*)    MCV 77.8 (*)    All other components within normal limits  URINE CULTURE  POC URINE PREG, ED     MDM  Pregnancy test is negative  Creatinine at baseline with no significant electrolyte abnormality.  Mild leukocytosis of 13.  Anemia but no signs or symptoms concerning for GI bleed, appears microcytic.  Platelets are within normal limits.  CT scan showed findings concerning  for obstructing kidney stone.  Discussed the patient's case with urology Dr. Francisca, recommended outpatient follow-up and states that this CT scan showed an ongoing kidney stone which has been present over the past 7 months and is just moving down her ureter.  Patient states that she has not followed up with urology as an outpatient.  He recommended starting her on Keflex and having her follow-up as an outpatient with urology.  Given return precautions for any worsening symptoms.  Will start the patient on Keflex given that her only allergy to penicillin is a rash.  Urine was sent for culture.  Plan for pain medication, Flomax  and antiemetics.  Given return precautions.     PROCEDURES:  Critical Care performed: No  Procedures  Patient's presentation is most consistent with acute presentation with potential threat to life or bodily function.   MEDICATIONS ORDERED IN ED: Medications  cefTRIAXone  (ROCEPHIN ) 1 g in sodium chloride  0.9 % 100 mL IVPB (has no administration in time range)  ondansetron  (ZOFRAN ) injection 4 mg (4 mg Intravenous Given 05/24/24 1305)  ketorolac  (TORADOL ) 15 MG/ML injection 15 mg (15 mg Intravenous Given 05/24/24 1305)  iohexol  (OMNIPAQUE ) 300 MG/ML solution 100 mL (100 mLs Intravenous Contrast Given 05/24/24 1422)  ondansetron  (ZOFRAN ) injection 4 mg (4 mg Intravenous Given 05/24/24 1506)    FINAL CLINICAL IMPRESSION(S) / ED DIAGNOSES   Final diagnoses:  Left flank pain  Kidney stone     Rx / DC Orders   ED Discharge Orders          Ordered    oxyCODONE -acetaminophen  (PERCOCET) 5-325 MG tablet  Every 4 hours PRN        05/24/24 1537    tamsulosin  (FLOMAX ) 0.4 MG CAPS capsule  Daily        05/24/24 1537    ondansetron  (ZOFRAN -ODT) 4 MG disintegrating tablet  Every 8 hours PRN        05/24/24 1537    cephALEXin (KEFLEX) 500 MG capsule  2 times daily        05/24/24 1537             Note:  This document was prepared using Dragon voice recognition  software and may include unintentional dictation errors.   Suzanne Kirsch, MD 05/24/24 1537    Suzanne Kirsch, MD 05/24/24 1539

## 2024-05-24 NOTE — ED Triage Notes (Signed)
 C/o left flank pain since this AM. Having N/V. Reports little output when urinating   Just finished antibiotics for UTI  Reports history kidney stones

## 2024-05-25 LAB — URINE CULTURE: Culture: NO GROWTH

## 2024-06-03 ENCOUNTER — Encounter: Payer: Self-pay | Admitting: Urology

## 2024-06-03 ENCOUNTER — Ambulatory Visit (INDEPENDENT_AMBULATORY_CARE_PROVIDER_SITE_OTHER): Admitting: Urology

## 2024-06-03 ENCOUNTER — Other Ambulatory Visit: Payer: Self-pay

## 2024-06-03 VITALS — BP 128/78 | Ht 62.0 in | Wt 196.0 lb

## 2024-06-03 DIAGNOSIS — N201 Calculus of ureter: Secondary | ICD-10-CM

## 2024-06-03 DIAGNOSIS — N200 Calculus of kidney: Secondary | ICD-10-CM

## 2024-06-03 LAB — MICROSCOPIC EXAMINATION

## 2024-06-03 LAB — URINALYSIS, COMPLETE
Bilirubin, UA: NEGATIVE
Glucose, UA: NEGATIVE
Ketones, UA: NEGATIVE
Nitrite, UA: NEGATIVE
Protein,UA: NEGATIVE
RBC, UA: NEGATIVE
Specific Gravity, UA: 1.02 (ref 1.005–1.030)
Urobilinogen, Ur: 0.2 mg/dL (ref 0.2–1.0)
pH, UA: 6 (ref 5.0–7.5)

## 2024-06-03 NOTE — Progress Notes (Signed)
   06/03/24 2:14 PM   [name] [birth year] [id number]  CC: Left ureteral stone  HPI: 33 year old female with intermittent left flank pain since November 2024.  She has had multiple CT scans that have shown a 1 cm left ureteral stone that has slowly moved more distally, most recently in the distal ureter on CT from 05/24/2024.  She denies any fevers, chills, or gross hematuria.  She has never followed up with urology previously.   PMH: Past Medical History:  Diagnosis Date   Anemia    Gestational diabetes    diet controlled   Kidney stone    Pregnancy induced hypertension     Surgical History: Past Surgical History:  Procedure Laterality Date   CHOLECYSTECTOMY     WISDOM TOOTH EXTRACTION      Family History: Family History  Problem Relation Age of Onset   Diabetes Mother     Social History:  reports that she has been smoking cigarettes. She has never used smokeless tobacco. She reports current drug use. Drug: Marijuana. She reports that she does not drink alcohol.  Physical Exam: BP 128/78 (BP Location: Left Arm, Patient Position: Sitting, Cuff Size: Large)   Ht 5' 2 (1.575 m)   Wt 196 lb (88.9 kg)   BMI 35.85 kg/m    Constitutional:  Alert and oriented, No acute distress. Cardiovascular: No clubbing, cyanosis, or edema. Respiratory: Normal respiratory effort, no increased work of breathing. GI: Abdomen is soft, nontender, nondistended, no abdominal masses   Laboratory Data: Reviewed, see HPI  Pertinent Imaging: I have personally viewed and interpreted the multiple CT scan showing progression of the large ureteral stone, now in the distal ureter on most recent CT from 05/24/2024.  Density 1200hu.  Assessment & Plan:   33 year old female with 1 cm left ureteral stone since at least November 2024  We discussed various treatment options for urolithiasis including observation with or without medical expulsive therapy, shockwave lithotripsy (SWL),  ureteroscopy and laser lithotripsy with stent placement, and percutaneous nephrolithotomy.We discussed that management is based on stone size, location, density, patient co-morbidities, and patient preference. Stones <70mm in size have a >80% spontaneous passage rate. Data surrounding the use of tamsulosin  for medical expulsive therapy is controversial, but meta analyses suggests it is most efficacious for distal stones between 5-35mm in size. Possible side effects include dizziness/lightheadedness, and retrograde ejaculation.SWL has a lower stone free rate in a single procedure, but also a lower complication rate compared to ureteroscopy and avoids a stent and associated stent related symptoms. Possible complications include renal hematoma, steinstrasse, and need for additional treatment.Ureteroscopy with laser lithotripsy and stent placement has a higher stone free rate than SWL in a single procedure, however increased complication rate including possible infection, ureteral injury, bleeding, and stent related morbidity. Common stent related symptoms include dysuria, urgency/frequency, and flank pain.  Schedule left ureteroscopy, laser lithotripsy, stent placement   Redell Burnet, MD 06/03/2024  [facility] Urology [address], [address] [place], [place] [address] [phone]

## 2024-06-03 NOTE — Progress Notes (Signed)
 Surgical Physician Order Form [facility] [place]  Dr. Redell Burnet, MD  * Scheduling expectation : Next Available  *Length of Case: 45 minutes  *Clearance needed: no  *Anticoagulation Instructions: May continue all anticoagulants  *Aspirin Instructions: Ok to continue all  *Post-op visit Date/Instructions:  tbd  *Diagnosis: Left Ureteral Stone  *Procedure: left Ureteroscopy w/laser lithotripsy & stent placement (47643)   Additional orders: N/A  -Admit type: OUTpatient  -Anesthesia: General  -VTE Prophylaxis Standing Order SCD's       Other:   -Standing Lab Orders Per Anesthesia    Lab other: Culture sent 7/10  -Standing Test orders EKG/Chest x-ray per Anesthesia       Test other:   - Medications:  Cipro  400mg  IV  -Other orders:  N/A

## 2024-06-03 NOTE — Patient Instructions (Addendum)
Laser Therapy for Kidney Stones Laser therapy for kidney stones is a procedure to break up rock-like masses that form inside the kidneys (kidney stones). It is done using a device that beams a strong light (laser) on the kidney stones. This breaks the stones up into small pieces. These small pieces may leave your body when you pee (urinate) or may be taken out during the procedure.  You may need laser therapy if you have kidney stones that are painful or that are stopping you from being able to pee. Tell a health care provider about: Any allergies you have. All medicines you are taking, including vitamins, herbs, eye drops, creams, and over-the-counter medicines. Any problems you or family members have had with anesthesia. Any bleeding problems you have. Any surgeries you have had. Any medical conditions you have. Whether you are pregnant or may be pregnant. What are the risks? Your health care provider will talk with you about risks. These may include: Infection. Bleeding. Allergic reactions to medicines. Damage to: The part of your body that drains pee (urine) from the bladder (urethra). The bladder. The tube that connects the bladder to the kidneys (ureter). Urinary tract infection (UTI). Urethral stricture. This is when the urethra is narrowed by scarring. Trouble peeing. Blockage of the kidney. This may be caused by a piece of kidney stone. What happens before the procedure? When to stop eating and drinking Follow instructions from your provider about what you may eat and drink. These may include: 8 hours before the procedure Stop eating most foods. Do not eat meat, fried foods, or fatty foods. Eat only light foods, such as toast or crackers. All liquids are okay except energy drinks and alcohol. 6 hours before the procedure Stop eating. Drink only clear liquids, such as water, clear fruit juice, black coffee, plain tea, and sports drinks. Do not drink energy drinks or  alcohol. 2 hours before the procedure Stop drinking all liquids. You may be allowed to take medicines with small sips of water. If you do not follow your provider's instructions, your procedure may be delayed or canceled. Medicines Ask your provider about: Changing or stopping your regular medicines. These include any diabetes medicines or blood thinners you take. Taking medicines such as aspirin and ibuprofen. These medicines can thin your blood. Do not take them unless your provider tells you to. Taking over-the-counter medicines, vitamins, herbs, and supplements. Tests You may have a physical exam before the procedure. You may also have tests done. These may include: Imaging tests. Blood or pee tests. Surgery safety Ask your provider: How your surgery site will be marked. What steps will be taken to help prevent infection. These steps may include: Removing hair at the surgery site. Washing skin with a soap that kills germs. Taking antibiotics. General instructions Do not use any products that contain nicotine or tobacco for at least 4 weeks before the procedure. These products include cigarettes, chewing tobacco, and vaping devices, such as e-cigarettes. If you need help quitting, ask your provider. If you will be going home right after the procedure, plan to have a responsible adult: Take you home from the hospital or clinic. You will not be allowed to drive. Care for you for the time you are told. What happens during the procedure?  An IV will be inserted into one of your veins. You will be given: A sedative. This helps you relax. Anesthesia. This keeps you from feeling pain. It will make you fall asleep for surgery. A tool  with a camera on the end (ureteroscope) will be put into your urethra. It will be moved through your bladder to your kidney. It will send pictures to a screen in the operating room. This will show what parts of your kidney need to be treated. A tube will be  put through the ureteroscope. It will be moved into your kidney. The laser device will be put into your kidney through the tube. The laser will be used to break up the kidney stones. A tool with a tiny wire basket may be put through the tube into your kidney. This can help remove the small pieces of the kidney stone. A small mesh tube (stent) may be placed to allow your kidney to drain. The tube and ureteroscope will be taken out at the end of the surgery. The procedure may vary among providers and hospitals. What happens after the procedure? Your blood pressure, heart rate, breathing rate, and blood oxygen level will be monitored until you leave the hospital or clinic. If you had a stent placed, it may have a string that will be secured to your skin. This helps your provider remove the stent. You may be given a strainer to collect any stone pieces that you pass in your pee. Your provider may have these tested. This information is not intended to replace advice given to you by your health care provider. Make sure you discuss any questions you have with your health care provider. Document Revised: 07/12/2022 Document Reviewed: 07/12/2022 Elsevier Patient Education  2024 [employer].

## 2024-06-04 ENCOUNTER — Telehealth: Payer: Self-pay

## 2024-06-04 NOTE — Progress Notes (Signed)
   [facility] Surgical Posting Form  Surgery Date: Date: 06/11/2024  Surgeon: Dr. Redell Burnet, MD  Inpt ( No  )   Outpt (Yes)   Obs ( No  )   Diagnosis: N20.1 Left Ureteral Stone  -CPT: [phone]  Surgery: Left Ureteroscopy with Laser Lithotripsy and Stent Placement  Stop Anticoagulations: No, may continue all   Cardiac/Medical/Pulmonary Clearance needed: no  *Orders entered into EPIC  Date: 06/04/24   *Case booked in EPIC  Date: 06/04/24  *Notified pt of Surgery: Date: 06/04/24  PRE-OP UA & CX: yes, obtained and sent on 06/03/2024  *Placed into Prior Authorization Work [place] Date: 06/04/24  Assistant/laser/rep:No

## 2024-06-04 NOTE — Telephone Encounter (Signed)
 Per Dr. Francisca, Patient is to be scheduled for Left Ureteroscopy with Laser Lithotripsy and Stent Placement   Patricia Henson was contacted and possible surgical dates were discussed, Friday July 18th, 2025 was agreed upon for surgery.   Patient was directed to call [phone] between 1-3pm the day before surgery to find out surgical arrival time.  Instructions were given not to eat or drink from midnight on the night before surgery and have a driver for the day of surgery. On the surgery day patient was instructed to enter through the Medical Mall entrance of [facility] report the Same Day Surgery desk.   Pre-Admit Testing will be in contact via phone to set up an interview with the anesthesia team to review your history and medications prior to surgery.   Reminder of this information was sent via MyChart to the patient.

## 2024-06-07 LAB — CULTURE, URINE COMPREHENSIVE

## 2024-06-08 ENCOUNTER — Inpatient Hospital Stay
Admission: RE | Admit: 2024-06-08 | Discharge: 2024-06-08 | Disposition: A | Discharge status: Home / Self Care | Source: Ambulatory Visit

## 2024-06-08 NOTE — Patient Instructions (Signed)
 Your procedure is scheduled on: 06/11/24 - Friday Report to the Registration Desk on the 1st floor of the Medical Mall. To find out your arrival time, please call [phone] between 1PM - 3PM on: 06/10/24 - Thursday If your arrival time is 6:00 am, do not arrive before that time as the Medical Mall entrance doors do not open until 6:00 am.  REMEMBER: Instructions that are not followed completely may result in serious medical risk, up to and including death; or upon the discretion of your surgeon and anesthesiologist your surgery may need to be rescheduled.  Do not eat food or drink any liquids after midnight the night before surgery.  No gum chewing or hard candies.  One week prior to surgery: Stop Anti-inflammatories (NSAIDS) such as Advil , Aleve, Ibuprofen , Motrin , Naproxen, Naprosyn and Aspirin based products such as Excedrin, Goody's Powder, BC Powder. You may take Tylenol  if needed for pain up until the day of surgery.  Stop ANY OVER THE COUNTER supplements until after surgery.  ON THE DAY OF SURGERY ONLY TAKE THESE MEDICATIONS WITH SIPS OF WATER:  none   No Alcohol for 24 hours before or after surgery.  No Smoking including e-cigarettes for 24 hours before surgery.  No chewable tobacco products for at least 6 hours before surgery.  No nicotine patches on the day of surgery.  Do not use any recreational drugs for at least a week (preferably 2 weeks) before your surgery.  Please be advised that the combination of cocaine and anesthesia may have negative outcomes, up to and including death. If you test positive for cocaine, your surgery will be cancelled.  On the morning of surgery brush your teeth with toothpaste and water, you may rinse your mouth with mouthwash if you wish. Do not swallow any toothpaste or mouthwash.  Do not wear jewelry, make-up, hairpins, clips or nail polish.  For welded (permanent) jewelry: bracelets, anklets, waist bands, etc.  Please have this  removed prior to surgery.  If it is not removed, there is a chance that hospital personnel will need to cut it off on the day of surgery.  Do not wear lotions, powders, or perfumes.   Do not shave body hair from the neck down 48 hours before surgery.  Contact lenses, hearing aids and dentures may not be worn into surgery.  Do not bring valuables to the hospital. [facility] is not responsible for any missing/lost belongings or valuables.   Notify your doctor if there is any change in your medical condition (cold, fever, infection).  Wear comfortable clothing (specific to your surgery type) to the hospital.  After surgery, you can help prevent lung complications by doing breathing exercises.  Take deep breaths and cough every 1-2 hours. Your doctor may order a device called an Incentive Spirometer to help you take deep breaths. When coughing or sneezing, hold a pillow firmly against your incision with both hands. This is called "splinting." Doing this helps protect your incision. It also decreases belly discomfort.  If you are being admitted to the hospital overnight, leave your suitcase in the car. After surgery it may be brought to your room.  In case of increased patient census, it may be necessary for you, the patient, to continue your postoperative care in the Same Day Surgery department.  If you are being discharged the day of surgery, you will not be allowed to drive home. You will need a responsible individual to drive you home and stay with you for 24  hours after surgery.   If you are taking public transportation, you will need to have a responsible individual with you.  Please call the Pre-admissions Testing Dept. at [phone] if you have any questions about these instructions.  Surgery Visitation Policy:  Patients having surgery or a procedure may have two visitors.  Children under the age of 50 must have an adult with them who is not the patient.  Inpatient  Visitation:    Visiting hours are 7 a.m. to 8 p.m. Up to four visitors are allowed at one time in a patient room. The visitors may rotate out with other people during the day.  One visitor age 50 or older may stay with the patient overnight and must be in the room by 8 p.m.   Merchandiser, retail to address health-related social needs:  [email or web address]

## 2024-06-11 ENCOUNTER — Inpatient Hospital Stay
Admission: RE | Admit: 2024-06-11 | Discharge: 2024-06-11 | Disposition: A | Discharge status: Home / Self Care | Source: Ambulatory Visit

## 2024-06-11 NOTE — Patient Instructions (Signed)
 Your procedure is scheduled on: 06/28/24 - Monday Report to the Registration Desk on the 1st floor of the Medical Mall. To find out your arrival time, please call [phone] between 1PM - 3PM on: 06/25/24 - Friday If your arrival time is 6:00 am, do not arrive before that time as the Medical Mall entrance doors do not open until 6:00 am.  REMEMBER: Instructions that are not followed completely may result in serious medical risk, up to and including death; or upon the discretion of your surgeon and anesthesiologist your surgery may need to be rescheduled.  Do not eat food or drink any liquids after midnight the night before surgery.  No gum chewing or hard candies.  One week prior to surgery: Stop Anti-inflammatories (NSAIDS) such as Advil , Aleve, Ibuprofen , Motrin , Naproxen, Naprosyn and Aspirin based products such as Excedrin, Goody's Powder, BC Powder. You may take Tylenol  if needed for pain up until the day of surgery.  Stop ANY OVER THE COUNTER supplements until after surgery.  ON THE DAY OF SURGERY ONLY TAKE THESE MEDICATIONS WITH SIPS OF WATER:  none  No Alcohol for 24 hours before or after surgery.  No Smoking including e-cigarettes for 24 hours before surgery.  No chewable tobacco products for at least 6 hours before surgery.  No nicotine patches on the day of surgery.  Do not use any recreational drugs for at least a week (preferably 2 weeks) before your surgery.  Please be advised that the combination of cocaine and anesthesia may have negative outcomes, up to and including death. If you test positive for cocaine, your surgery will be cancelled.  On the morning of surgery brush your teeth with toothpaste and water, you may rinse your mouth with mouthwash if you wish. Do not swallow any toothpaste or mouthwash.  Use CHG Soap or wipes as directed on instruction sheet.  Do not wear jewelry, make-up, hairpins, clips or nail polish.  For welded (permanent) jewelry:  bracelets, anklets, waist bands, etc.  Please have this removed prior to surgery.  If it is not removed, there is a chance that hospital personnel will need to cut it off on the day of surgery.  Do not wear lotions, powders, or perfumes.   Do not shave body hair from the neck down 48 hours before surgery.  Contact lenses, hearing aids and dentures may not be worn into surgery.  Do not bring valuables to the hospital. [facility] is not responsible for any missing/lost belongings or valuables.   Notify your doctor if there is any change in your medical condition (cold, fever, infection).  Wear comfortable clothing (specific to your surgery type) to the hospital.  After surgery, you can help prevent lung complications by doing breathing exercises.  Take deep breaths and cough every 1-2 hours. Your doctor may order a device called an Incentive Spirometer to help you take deep breaths.  When coughing or sneezing, hold a pillow firmly against your incision with both hands. This is called "splinting." Doing this helps protect your incision. It also decreases belly discomfort.  If you are being admitted to the hospital overnight, leave your suitcase in the car. After surgery it may be brought to your room.  In case of increased patient census, it may be necessary for you, the patient, to continue your postoperative care in the Same Day Surgery department.  If you are being discharged the day of surgery, you will not be allowed to drive home. You will need a responsible  individual to drive you home and stay with you for 24 hours after surgery.   If you are taking public transportation, you will need to have a responsible individual with you.  Please call the Pre-admissions Testing Dept. at [phone] if you have any questions about these instructions.  Surgery Visitation Policy:  Patients having surgery or a procedure may have two visitors.  Children under the age of 78 must have an  adult with them who is not the patient.  Inpatient Visitation:    Visiting hours are 7 a.m. to 8 p.m. Up to four visitors are allowed at one time in a patient room. The visitors may rotate out with other people during the day.  One visitor age 36 or older may stay with the patient overnight and must be in the room by 8 p.m.   Merchandiser, retail to address health-related social needs:  [email or web address]

## 2024-06-11 NOTE — Pre-Procedure Instructions (Signed)
 Patient requested that her PAT call be rescheduled for 06/14/24. Schedule changed.

## 2024-06-14 ENCOUNTER — Encounter
Admission: RE | Admit: 2024-06-14 | Discharge: 2024-06-14 | Disposition: A | Discharge status: Home / Self Care | Source: Ambulatory Visit | Attending: Urology | Admitting: Urology

## 2024-06-14 ENCOUNTER — Other Ambulatory Visit: Payer: Self-pay

## 2024-06-14 DIAGNOSIS — Z01812 Encounter for preprocedural laboratory examination: Secondary | ICD-10-CM

## 2024-06-14 HISTORY — DX: Headache, unspecified: R51.9

## 2024-06-14 HISTORY — DX: Personal history of urinary calculi: Z87.442

## 2024-06-14 NOTE — Patient Instructions (Addendum)
 Your procedure is scheduled on: 06/28/24 - Monday Report to the Registration Desk on the 1st floor of the Medical Mall. To find out your arrival time, please call [phone] between 1PM - 3PM on: 06/25/24 - Friday If your arrival time is 6:00 am, do not arrive before that time as the Medical Mall entrance doors do not open until 6:00 am.  REMEMBER: Instructions that are not followed completely may result in serious medical risk, up to and including death; or upon the discretion of your surgeon and anesthesiologist your surgery may need to be rescheduled.  Do not eat food or drink any liquids after midnight the night before surgery.  No gum chewing or hard candies.  One week prior to surgery: Stop Anti-inflammatories (NSAIDS) such as Advil , Aleve, Ibuprofen , Motrin , Naproxen, Naprosyn and Aspirin based products such as Excedrin, Goody's Powder, BC Powder. You may take Tylenol  if needed for pain up until the day of surgery.  Stop ANY OVER THE COUNTER supplements until after surgery.   ON THE DAY OF SURGERY ONLY TAKE THESE MEDICATIONS WITH SIPS OF WATER:  none  No Alcohol for 24 hours before or after surgery.  No Smoking including e-cigarettes for 24 hours before surgery.  No chewable tobacco products for at least 6 hours before surgery.  No nicotine patches on the day of surgery.  Do not use any recreational drugs for at least a week (preferably 2 weeks) before your surgery.  Please be advised that the combination of cocaine and anesthesia may have negative outcomes, up to and including death. If you test positive for cocaine, your surgery will be cancelled.  On the morning of surgery brush your teeth with toothpaste and water, you may rinse your mouth with mouthwash if you wish. Do not swallow any toothpaste or mouthwash.  Do not wear jewelry, make-up, hairpins, clips or nail polish.  For welded (permanent) jewelry: bracelets, anklets, waist bands, etc.  Please have this  removed prior to surgery.  If it is not removed, there is a chance that hospital personnel will need to cut it off on the day of surgery.  Do not wear lotions, powders, or perfumes.   Do not shave body hair from the neck down 48 hours before surgery.  Contact lenses, hearing aids and dentures may not be worn into surgery.  Do not bring valuables to the hospital. [facility] is not responsible for any missing/lost belongings or valuables.   Notify your doctor if there is any change in your medical condition (cold, fever, infection).  Wear comfortable clothing (specific to your surgery type) to the hospital.  After surgery, you can help prevent lung complications by doing breathing exercises.  Take deep breaths and cough every 1-2 hours. Your doctor may order a device called an Incentive Spirometer to help you take deep breaths.  When coughing or sneezing, hold a pillow firmly against your incision with both hands. This is called "splinting." Doing this helps protect your incision. It also decreases belly discomfort.  If you are being admitted to the hospital overnight, leave your suitcase in the car. After surgery it may be brought to your room.  In case of increased patient census, it may be necessary for you, the patient, to continue your postoperative care in the Same Day Surgery department.  If you are being discharged the day of surgery, you will not be allowed to drive home. You will need a responsible individual to drive you home and stay with you for  24 hours after surgery.   If you are taking public transportation, you will need to have a responsible individual with you.  Please call the Pre-admissions Testing Dept. at [phone] if you have any questions about these instructions.  Surgery Visitation Policy:  Patients having surgery or a procedure may have two visitors.  Children under the age of 7 must have an adult with them who is not the patient.  Inpatient  Visitation:    Visiting hours are 7 a.m. to 8 p.m. Up to four visitors are allowed at one time in a patient room. The visitors may rotate out with other people during the day.  One visitor age 40 or older may stay with the patient overnight and must be in the room by 8 p.m.   Merchandiser, retail to address health-related social needs:  [email or web address]

## 2024-06-15 ENCOUNTER — Telehealth: Payer: Self-pay

## 2024-06-15 ENCOUNTER — Other Ambulatory Visit: Payer: Self-pay

## 2024-06-15 MED ORDER — KETOROLAC TROMETHAMINE 10 MG PO TABS: 10.0000 mg | ORAL_TABLET | Freq: Four times a day (QID) | ORAL | 0 refills | Status: DC | PRN

## 2024-06-15 NOTE — Telephone Encounter (Signed)
 Called and left voicemail for patient that she can go pick up her medication that was sent in for her per the Dr.Sninkys order

## 2024-06-15 NOTE — Telephone Encounter (Signed)
 Patient is having pain related to her left side stone. She woke up this morning with moderate pain no fevers and no chills but is taking her zofran  for nausea. Patient stated it felt like period cramps at first but is on left side only. She is requesting if we could send her in some pain medications to tide her over until her surgery on the 8/4.  Please advise

## 2024-06-28 ENCOUNTER — Ambulatory Visit: Admission: RE | Admit: 2024-06-28 | Source: Home / Self Care | Admitting: Urology

## 2024-06-28 ENCOUNTER — Encounter: Admission: RE | Payer: Self-pay | Source: Home / Self Care

## 2024-06-28 SURGERY — CYSTOSCOPY/URETEROSCOPY/HOLMIUM LASER/STENT PLACEMENT
Anesthesia: General | Laterality: Left

## 2024-09-26 ENCOUNTER — Emergency Department

## 2024-09-26 ENCOUNTER — Encounter: Admission: EM | Disposition: A | Payer: Self-pay | Source: Home / Self Care | Attending: Internal Medicine

## 2024-09-26 ENCOUNTER — Inpatient Hospital Stay
Admission: EM | Admit: 2024-09-26 | Discharge: 2024-09-29 | DRG: 854 | Disposition: A | Discharge status: Home / Self Care | Attending: Student | Admitting: Student

## 2024-09-26 ENCOUNTER — Inpatient Hospital Stay

## 2024-09-26 ENCOUNTER — Inpatient Hospital Stay: Admitting: Anesthesiology

## 2024-09-26 ENCOUNTER — Other Ambulatory Visit: Payer: Self-pay

## 2024-09-26 DIAGNOSIS — F17210 Nicotine dependence, cigarettes, uncomplicated: Secondary | ICD-10-CM | POA: Diagnosis present

## 2024-09-26 DIAGNOSIS — E66811 Obesity, class 1: Secondary | ICD-10-CM | POA: Diagnosis present

## 2024-09-26 DIAGNOSIS — N10 Acute tubulo-interstitial nephritis: Secondary | ICD-10-CM | POA: Diagnosis not present

## 2024-09-26 DIAGNOSIS — R1030 Lower abdominal pain, unspecified: Secondary | ICD-10-CM | POA: Diagnosis present

## 2024-09-26 DIAGNOSIS — Z833 Family history of diabetes mellitus: Secondary | ICD-10-CM | POA: Diagnosis not present

## 2024-09-26 DIAGNOSIS — N136 Pyonephrosis: Secondary | ICD-10-CM | POA: Diagnosis present

## 2024-09-26 DIAGNOSIS — N12 Tubulo-interstitial nephritis, not specified as acute or chronic: Principal | ICD-10-CM | POA: Diagnosis present

## 2024-09-26 DIAGNOSIS — Z9049 Acquired absence of other specified parts of digestive tract: Secondary | ICD-10-CM | POA: Diagnosis not present

## 2024-09-26 DIAGNOSIS — N132 Hydronephrosis with renal and ureteral calculous obstruction: Secondary | ICD-10-CM | POA: Diagnosis not present

## 2024-09-26 DIAGNOSIS — A4151 Sepsis due to Escherichia coli [E. coli]: Principal | ICD-10-CM | POA: Diagnosis present

## 2024-09-26 DIAGNOSIS — D509 Iron deficiency anemia, unspecified: Secondary | ICD-10-CM | POA: Diagnosis present

## 2024-09-26 DIAGNOSIS — Z880 Allergy status to penicillin: Secondary | ICD-10-CM | POA: Diagnosis not present

## 2024-09-26 DIAGNOSIS — I10 Essential (primary) hypertension: Secondary | ICD-10-CM | POA: Diagnosis present

## 2024-09-26 DIAGNOSIS — Z79899 Other long term (current) drug therapy: Secondary | ICD-10-CM | POA: Diagnosis not present

## 2024-09-26 DIAGNOSIS — N390 Urinary tract infection, site not specified: Secondary | ICD-10-CM

## 2024-09-26 DIAGNOSIS — N200 Calculus of kidney: Secondary | ICD-10-CM

## 2024-09-26 DIAGNOSIS — Z6834 Body mass index (BMI) 34.0-34.9, adult: Secondary | ICD-10-CM | POA: Diagnosis not present

## 2024-09-26 DIAGNOSIS — E876 Hypokalemia: Secondary | ICD-10-CM | POA: Diagnosis present

## 2024-09-26 DIAGNOSIS — N201 Calculus of ureter: Secondary | ICD-10-CM | POA: Diagnosis present

## 2024-09-26 DIAGNOSIS — D751 Secondary polycythemia: Secondary | ICD-10-CM | POA: Diagnosis present

## 2024-09-26 DIAGNOSIS — D573 Sickle-cell trait: Secondary | ICD-10-CM | POA: Diagnosis present

## 2024-09-26 HISTORY — PX: CYSTOSCOPY W/ URETERAL STENT PLACEMENT: SHX1429

## 2024-09-26 LAB — CBC
HCT: 31.4 % — ABNORMAL LOW (ref 36.0–46.0)
Hemoglobin: 11.3 g/dL — ABNORMAL LOW (ref 12.0–15.0)
MCH: 27.4 pg (ref 26.0–34.0)
MCHC: 36 g/dL (ref 30.0–36.0)
MCV: 76.2 fL — ABNORMAL LOW (ref 80.0–100.0)
Platelets: 202 K/uL (ref 150–400)
RBC: 4.12 MIL/uL (ref 3.87–5.11)
RDW: 14.8 % (ref 11.5–15.5)
WBC: 18.7 K/uL — ABNORMAL HIGH (ref 4.0–10.5)
nRBC: 0 % (ref 0.0–0.2)

## 2024-09-26 LAB — BLOOD CULTURE ID PANEL (REFLEXED) - BCID2

## 2024-09-26 LAB — URINALYSIS, W/ REFLEX TO CULTURE (INFECTION SUSPECTED)
Bilirubin Urine: NEGATIVE
Glucose, UA: NEGATIVE mg/dL
Ketones, ur: 20 mg/dL — AB
Nitrite: POSITIVE — AB
Protein, ur: 100 mg/dL — AB
Specific Gravity, Urine: 1.041 — ABNORMAL HIGH (ref 1.005–1.030)
WBC, UA: >50 WBC/hpf (ref 0–5)
pH: 5 (ref 5.0–8.0)

## 2024-09-26 LAB — LACTIC ACID, PLASMA
Lactic Acid, Venous: 1.4 mmol/L (ref 0.5–1.9)
Lactic Acid, Venous: 3.9 mmol/L (ref 0.5–1.9)

## 2024-09-26 LAB — COMPREHENSIVE METABOLIC PANEL WITH GFR
ALT: 19 U/L (ref 0–44)
AST: 27 U/L (ref 15–41)
Albumin: 3.5 g/dL (ref 3.5–5.0)
Alkaline Phosphatase: 76 U/L (ref 38–126)
Anion gap: 15 (ref 5–15)
BUN: 19 mg/dL (ref 6–20)
CO2: 21 mmol/L — ABNORMAL LOW (ref 22–32)
Calcium: 9.6 mg/dL (ref 8.9–10.3)
Chloride: 100 mmol/L (ref 98–111)
Creatinine, Ser: 1.14 mg/dL — ABNORMAL HIGH (ref 0.44–1.00)
GFR, Estimated: >60 mL/min (ref >60)
Glucose, Bld: 172 mg/dL — ABNORMAL HIGH (ref 70–99)
Potassium: 3.3 mmol/L — ABNORMAL LOW (ref 3.5–5.1)
Sodium: 136 mmol/L (ref 135–145)
Total Bilirubin: 1.6 mg/dL — ABNORMAL HIGH (ref 0.0–1.2)
Total Protein: 8.1 g/dL (ref 6.5–8.1)

## 2024-09-26 LAB — LIPASE, BLOOD: Lipase: 22 U/L (ref 11–51)

## 2024-09-26 LAB — HCG, QUANTITATIVE, PREGNANCY: hCG, Beta Chain, Quant, S: <1 m[IU]/mL (ref <5)

## 2024-09-26 LAB — HIV ANTIBODY (ROUTINE TESTING W REFLEX): HIV Screen 4th Generation wRfx: NONREACTIVE

## 2024-09-26 SURGERY — CYSTOSCOPY, WITH RETROGRADE PYELOGRAM AND URETERAL STENT INSERTION
Anesthesia: General | Site: Ureter | Laterality: Left

## 2024-09-26 MED ORDER — OXYCODONE HCL 5 MG PO TABS
5.0000 mg | ORAL_TABLET | Freq: Once | ORAL | Status: AC | PRN
  Administered 2024-09-26: 5 mg via ORAL

## 2024-09-26 MED ORDER — IOHEXOL 180 MG/ML  SOLN
INTRAMUSCULAR | Status: DC | PRN
Start: 2024-09-26 — End: 2024-09-26
  Administered 2024-09-26: 10 mL

## 2024-09-26 MED ORDER — FENTANYL CITRATE (PF) 100 MCG/2ML IJ SOLN
25.0000 ug | INTRAMUSCULAR | Status: DC | PRN
  Administered 2024-09-26 (×2): 25 ug via INTRAVENOUS

## 2024-09-26 MED ORDER — MIDAZOLAM HCL 2 MG/2ML IJ SOLN
INTRAMUSCULAR | Status: AC
  Filled 2024-09-26: qty 2

## 2024-09-26 MED ORDER — HYDROMORPHONE HCL 1 MG/ML IJ SOLN
0.5000 mg | Freq: Once | INTRAMUSCULAR | Status: AC
  Administered 2024-09-26: 0.5 mg via INTRAVENOUS
  Filled 2024-09-26: qty 0.5

## 2024-09-26 MED ORDER — ONDANSETRON HCL 4 MG/2ML IJ SOLN
INTRAMUSCULAR | Status: AC
  Filled 2024-09-26: qty 2

## 2024-09-26 MED ORDER — SENNOSIDES-DOCUSATE SODIUM 8.6-50 MG PO TABS
1.0000 | ORAL_TABLET | Freq: Every evening | ORAL | Status: DC | PRN
  Administered 2024-09-28: 1 via ORAL
  Filled 2024-09-26: qty 1

## 2024-09-26 MED ORDER — ONDANSETRON HCL 4 MG/2ML IJ SOLN
4.0000 mg | Freq: Four times a day (QID) | INTRAMUSCULAR | Status: DC | PRN
  Administered 2024-09-26 – 2024-09-28 (×5): 4 mg via INTRAVENOUS
  Filled 2024-09-26 (×5): qty 2

## 2024-09-26 MED ORDER — ACETAMINOPHEN 10 MG/ML IV SOLN: 1000.0000 mg | Freq: Once | INTRAVENOUS | Status: DC | PRN

## 2024-09-26 MED ORDER — KETOROLAC TROMETHAMINE 30 MG/ML IJ SOLN
30.0000 mg | Freq: Four times a day (QID) | INTRAMUSCULAR | Status: DC | PRN
  Administered 2024-09-26: 30 mg via INTRAVENOUS
  Filled 2024-09-26: qty 1

## 2024-09-26 MED ORDER — LABETALOL HCL 5 MG/ML IV SOLN
10.0000 mg | INTRAVENOUS | Status: DC | PRN
  Filled 2024-09-26: qty 4

## 2024-09-26 MED ORDER — PHENYLEPHRINE 80 MCG/ML (10ML) SYRINGE FOR IV PUSH (FOR BLOOD PRESSURE SUPPORT)
PREFILLED_SYRINGE | INTRAVENOUS | Status: AC
  Filled 2024-09-26: qty 10

## 2024-09-26 MED ORDER — ONDANSETRON HCL 4 MG/2ML IJ SOLN
4.0000 mg | Freq: Once | INTRAMUSCULAR | Status: AC
  Administered 2024-09-26: 4 mg via INTRAVENOUS
  Filled 2024-09-26: qty 2

## 2024-09-26 MED ORDER — POTASSIUM CHLORIDE CRYS ER 20 MEQ PO TBCR
40.0000 meq | EXTENDED_RELEASE_TABLET | Freq: Once | ORAL | Status: AC
  Administered 2024-09-26: 40 meq via ORAL
  Filled 2024-09-26: qty 2

## 2024-09-26 MED ORDER — FENTANYL CITRATE (PF) 100 MCG/2ML IJ SOLN
INTRAMUSCULAR | Status: DC | PRN
  Administered 2024-09-26: 50 ug via INTRAVENOUS
  Administered 2024-09-26 (×2): 25 ug via INTRAVENOUS

## 2024-09-26 MED ORDER — OXYCODONE HCL 5 MG PO TABS: 5.0000 mg | ORAL_TABLET | ORAL | Status: DC | PRN

## 2024-09-26 MED ORDER — LACTATED RINGERS IV SOLN
INTRAVENOUS | Status: AC
Start: 2024-09-26 — End: 2024-09-27

## 2024-09-26 MED ORDER — HYDROMORPHONE HCL 1 MG/ML IJ SOLN
1.0000 mg | INTRAMUSCULAR | Status: DC | PRN
  Administered 2024-09-26 – 2024-09-27 (×4): 1 mg via INTRAVENOUS
  Filled 2024-09-26 (×4): qty 1

## 2024-09-26 MED ORDER — ONDANSETRON HCL 4 MG PO TABS
4.0000 mg | ORAL_TABLET | Freq: Four times a day (QID) | ORAL | Status: DC | PRN
  Administered 2024-09-29: 4 mg via ORAL
  Filled 2024-09-26: qty 1

## 2024-09-26 MED ORDER — FENTANYL CITRATE (PF) 100 MCG/2ML IJ SOLN
INTRAMUSCULAR | Status: AC
  Filled 2024-09-26: qty 2

## 2024-09-26 MED ORDER — LIDOCAINE HCL (PF) 2 % IJ SOLN
INTRAMUSCULAR | Status: AC
  Filled 2024-09-26: qty 5

## 2024-09-26 MED ORDER — OXYCODONE HCL 5 MG/5ML PO SOLN: 5.0000 mg | Freq: Once | ORAL | Status: AC | PRN

## 2024-09-26 MED ORDER — LIDOCAINE HCL (CARDIAC) PF 100 MG/5ML IV SOSY
PREFILLED_SYRINGE | INTRAVENOUS | Status: DC | PRN
Start: 2024-09-26 — End: 2024-09-26
  Administered 2024-09-26: 80 mg via INTRAVENOUS

## 2024-09-26 MED ORDER — DROPERIDOL 2.5 MG/ML IJ SOLN: 0.6250 mg | Freq: Once | INTRAMUSCULAR | Status: DC | PRN

## 2024-09-26 MED ORDER — ONDANSETRON HCL 4 MG/2ML IJ SOLN
INTRAMUSCULAR | Status: DC | PRN
  Administered 2024-09-26: 4 mg via INTRAVENOUS

## 2024-09-26 MED ORDER — SODIUM CHLORIDE 0.9 % IV BOLUS
1000.0000 mL | Freq: Once | INTRAVENOUS | Status: AC
  Administered 2024-09-26: 1000 mL via INTRAVENOUS

## 2024-09-26 MED ORDER — DEXAMETHASONE SOD PHOSPHATE PF 10 MG/ML IJ SOLN
INTRAMUSCULAR | Status: DC | PRN
  Administered 2024-09-26: 10 mg via INTRAVENOUS

## 2024-09-26 MED ORDER — SODIUM CHLORIDE 0.9 % IV BOLUS
1000.0000 mL | Freq: Once | INTRAVENOUS | Status: DC
Start: 2024-09-26 — End: 2024-09-29

## 2024-09-26 MED ORDER — ACETAMINOPHEN 650 MG RE SUPP: 650.0000 mg | Freq: Four times a day (QID) | RECTAL | Status: DC | PRN

## 2024-09-26 MED ORDER — PROPOFOL 10 MG/ML IV BOLUS
INTRAVENOUS | Status: AC
  Filled 2024-09-26: qty 20

## 2024-09-26 MED ORDER — ACETAMINOPHEN 325 MG PO TABS
650.0000 mg | ORAL_TABLET | Freq: Four times a day (QID) | ORAL | Status: DC | PRN
  Administered 2024-09-26: 650 mg via ORAL
  Filled 2024-09-26: qty 2

## 2024-09-26 MED ORDER — PHENYLEPHRINE 80 MCG/ML (10ML) SYRINGE FOR IV PUSH (FOR BLOOD PRESSURE SUPPORT)
PREFILLED_SYRINGE | INTRAVENOUS | Status: DC | PRN
  Administered 2024-09-26 (×2): 80 ug via INTRAVENOUS

## 2024-09-26 MED ORDER — SODIUM CHLORIDE 0.9 % IV SOLN
2.0000 g | INTRAVENOUS | Status: DC
  Administered 2024-09-27 – 2024-09-29 (×3): 2 g via INTRAVENOUS
  Filled 2024-09-26 (×3): qty 20

## 2024-09-26 MED ORDER — MIDAZOLAM HCL (PF) 2 MG/2ML IJ SOLN
INTRAMUSCULAR | Status: DC | PRN
  Administered 2024-09-26: 2 mg via INTRAVENOUS

## 2024-09-26 MED ORDER — LACTATED RINGERS IV BOLUS
1000.0000 mL | Freq: Once | INTRAVENOUS | Status: AC
  Administered 2024-09-26: 1000 mL via INTRAVENOUS

## 2024-09-26 MED ORDER — IOHEXOL 300 MG/ML  SOLN
100.0000 mL | Freq: Once | INTRAMUSCULAR | Status: AC | PRN
Start: 2024-09-26 — End: 2024-09-26
  Administered 2024-09-26: 100 mL via INTRAVENOUS

## 2024-09-26 MED ORDER — KETOROLAC TROMETHAMINE 30 MG/ML IJ SOLN
30.0000 mg | Freq: Four times a day (QID) | INTRAMUSCULAR | Status: DC | PRN
  Administered 2024-09-27 – 2024-09-28 (×2): 30 mg via INTRAVENOUS
  Filled 2024-09-26 (×3): qty 1

## 2024-09-26 MED ORDER — PROPOFOL 10 MG/ML IV BOLUS
INTRAVENOUS | Status: DC | PRN
  Administered 2024-09-26: 180 mg via INTRAVENOUS

## 2024-09-26 MED ORDER — SODIUM CHLORIDE 0.9 % IV SOLN
2.0000 g | Freq: Once | INTRAVENOUS | Status: AC
  Administered 2024-09-26: 2 g via INTRAVENOUS
  Filled 2024-09-26: qty 20

## 2024-09-26 MED ORDER — OXYCODONE HCL 5 MG PO TABS
ORAL_TABLET | ORAL | Status: AC
  Filled 2024-09-26: qty 1

## 2024-09-26 SURGICAL SUPPLY — 12 items
BRUSH SCRUB EZ 4% CHG (MISCELLANEOUS) IMPLANT
CATH URETL OPEN END 6FR 70 (CATHETERS) IMPLANT
GOWN STRL REUS W/ TWL LRG LVL3 (GOWN DISPOSABLE) IMPLANT
GOWN STRL REUS W/ TWL XL LVL3 (GOWN DISPOSABLE) IMPLANT
GUIDEWIRE ANG ZIPWIRE 035X150 (WIRE) IMPLANT
GUIDEWIRE STR DUAL SENSOR (WIRE) IMPLANT
KIT TURNOVER CYSTO (KITS) IMPLANT
PACK CYSTO AR (MISCELLANEOUS) IMPLANT
SET CYSTO IRRIGATION (SET/KITS/TRAYS/PACK) IMPLANT
STENT URET 6FRX24 CONTOUR (STENTS) IMPLANT
STENT URET 6FRX26 CONTOUR (STENTS) IMPLANT
SURGILUBE 2OZ TUBE FLIPTOP (MISCELLANEOUS) IMPLANT

## 2024-09-26 NOTE — ED Triage Notes (Addendum)
 Pt comes with lower back pain, nausea and lower pelvic area. Pt states this started three days ago. Pt states hx of kidney stones. Pt states pain and burning with little urination.

## 2024-09-26 NOTE — ED Notes (Signed)
 Advised nurse that patient has ready bed

## 2024-09-26 NOTE — Anesthesia Procedure Notes (Signed)
 Procedure Name: LMA Insertion Date/Time: 09/26/2024 1:32 PM  Performed by: Jaylene Nest, CRNAPre-anesthesia Checklist: Patient identified, Patient being monitored, Timeout performed, Emergency Drugs available and Suction available Patient Re-evaluated:Patient Re-evaluated prior to induction Oxygen Delivery Method: Circle system utilized Preoxygenation: Pre-oxygenation with 100% oxygen Induction Type: IV induction Ventilation: Mask ventilation without difficulty LMA: LMA inserted LMA Size: 4.0 Tube type: Oral Number of attempts: 1 Placement Confirmation: positive ETCO2 and breath sounds checked- equal and bilateral Tube secured with: Tape Dental Injury: Teeth and Oropharynx as per pre-operative assessment

## 2024-09-26 NOTE — Progress Notes (Signed)
   09/26/24 1030  Spiritual Encounters  Type of Visit Initial  Care provided to: Patient  Conversation partners present during encounter Nurse  Referral source Chaplain assessment;Nurse (RN/NT/LPN)  Reason for visit Routine spiritual support  OnCall Visit No  Interventions  Spiritual Care Interventions Made Established relationship of care and support;Compassionate presence;Prayer;Encouragement  Intervention Outcomes  Outcomes Connection to spiritual care;Awareness of support  Spiritual Care Plan  Spiritual Care Issues Still Outstanding No further spiritual care needs at this time (see row info)   Chaplain was in the ED and heard pt crying in pain. Nurse said chaplain could be of some help. Chaplain helped pt call emergency contact and stayed with her to try and take focus from her pain and to help her breathe. Chaplain stayed with pt until emergency contact came.

## 2024-09-26 NOTE — ED Notes (Signed)
 Previous provider Brandy did not give po K+ r/t nausea. Meds to be given once patient nausea subsides.

## 2024-09-26 NOTE — Anesthesia Postprocedure Evaluation (Signed)
 Anesthesia Post Note  Patient: Patricia Henson  Procedure(s) Performed: CYSTOSCOPY, WITH RETROGRADE PYELOGRAM AND URETERAL STENT INSERTION (Left: Ureter)  Patient location during evaluation: PACU Anesthesia Type: General Level of consciousness: awake and alert Pain management: pain level controlled Vital Signs Assessment: post-procedure vital signs reviewed and stable Respiratory status: spontaneous breathing, nonlabored ventilation, respiratory function stable and patient connected to nasal cannula oxygen Cardiovascular status: blood pressure returned to baseline and stable Postop Assessment: no apparent nausea or vomiting Anesthetic complications: no   No notable events documented.   Last Vitals:  Vitals:   09/26/24 1445 09/26/24 1506  BP: 112/71 112/68  Pulse: (!) 109 (!) 107  Resp: 18 20  Temp:  37.1 C  SpO2: 99% 99%    Last Pain:  Vitals:   09/26/24 1521  TempSrc:   PainSc: Asleep                 [place] KANDICE Clause

## 2024-09-26 NOTE — ED Notes (Signed)
 Called lab about collecting second set of cultures.

## 2024-09-26 NOTE — ED Provider Notes (Signed)
 [facility] Provider Note    Event Date/Time   First MD Initiated Contact with Patient 09/26/24 [phone]     (approximate)   History   Back Pain   HPI  Patricia Henson is a 33 y.o. female past medical history significant for prior kidney stone who presents to the emergency department with severe pain.  States that she has been having some lower abdominal pain for the past 3 days.  Did a video call with the urgent care and was diagnosed with a urinary tract infection and started on antibiotics.  States that her symptoms have not improved.  States that it got significantly worse over the past couple of hours with severe pain to her lower abdomen.  States that she is having ongoing burning with urination, small amount of hematuria and is peeing infrequently and a small amount.  States that she could be pregnant.  No prior ectopic pregnancy.  No vaginal bleeding and last menstrual cycle was approximately 14 days ago.  No prior ovarian cyst.  Prior cholecystectomy.  No fever or chills.     Physical Exam   Triage Vital Signs: ED Triage Vitals  Encounter Vitals Group     BP 09/26/24 0711 (!) 146/74     Girls Systolic BP Percentile --      Girls Diastolic BP Percentile --      Boys Systolic BP Percentile --      Boys Diastolic BP Percentile --      Pulse Rate 09/26/24 0711 (!) 120     Resp 09/26/24 0711 18     Temp 09/26/24 0711 98 F (36.7 C)     Temp src --      SpO2 09/26/24 0711 100 %     Weight 09/26/24 0710 190 lb (86.2 kg)     Height 09/26/24 0710 5' 2 (1.575 m)     Head Circumference --      Peak Flow --      Pain Score 09/26/24 0710 10     Pain Loc --      Pain Education --      Exclude from Growth Chart --     Most recent vital signs: Vitals:   09/26/24 0711 09/26/24 0717  BP: (!) 146/74   Pulse: (!) 120   Resp: 18   Temp: 98 F (36.7 C)   SpO2: 100% 100%    Physical Exam Constitutional:      General: She is in acute distress.      Appearance: She is well-developed.  HENT:     Head: Atraumatic.  Eyes:     Conjunctiva/sclera: Conjunctivae normal.  Cardiovascular:     Rate and Rhythm: Regular rhythm.  Pulmonary:     Effort: No respiratory distress.  Abdominal:     General: There is no distension.     Tenderness: There is abdominal tenderness (Diffuse lower abdominal tenderness to palpation).  Musculoskeletal:        General: Normal range of motion.     Cervical back: Normal range of motion.  Skin:    General: Skin is warm.     Capillary Refill: Capillary refill takes less than 2 seconds.  Neurological:     Mental Status: She is alert. Mental status is at baseline.  Psychiatric:        Mood and Affect: Mood normal.     IMPRESSION / MDM / ASSESSMENT AND PLAN / ED COURSE  I reviewed the triage vital signs  and the nursing notes.  On arrival patient in obvious distress, afebrile but tachycardic  Broad differential including pyelonephritis, kidney stone, infected kidney stone, appendicitis, ovarian torsion, ovarian cyst, ectopic pregnancy  Given IV pain medication and antiemetics.  Given IV fluids   RADIOLOGY I independently reviewed imaging, my interpretation of imaging: CT scan abdomen and pelvis with contrast -findings concerning for hydronephrosis to the left kidney with perinephric stranding.  Obstructing kidney stone noted.  Read as 10 mm kidney stone with perinephric stranding and moderate hydronephrosis  LABS (all labs ordered are listed, but only abnormal results are displayed) Labs interpreted as -    Labs Reviewed  CBC - Abnormal; Notable for the following components:      Result Value   WBC 18.7 (*)    Hemoglobin 11.3 (*)    HCT 31.4 (*)    MCV 76.2 (*)    All other components within normal limits  COMPREHENSIVE METABOLIC PANEL WITH GFR - Abnormal; Notable for the following components:   Potassium 3.3 (*)    CO2 21 (*)    Glucose, Bld 172 (*)    Creatinine, Ser 1.14 (*)    Total  Bilirubin 1.6 (*)    All other components within normal limits  URINALYSIS, W/ REFLEX TO CULTURE (INFECTION SUSPECTED) - Abnormal; Notable for the following components:   Color, Urine AMBER (*)    APPearance CLOUDY (*)    Specific Gravity, Urine 1.041 (*)    Hgb urine dipstick SMALL (*)    Ketones, ur 20 (*)    Protein, ur 100 (*)    Nitrite POSITIVE (*)    Leukocytes,Ua LARGE (*)    Bacteria, UA FEW (*)    All other components within normal limits  URINE CULTURE  CULTURE, BLOOD (ROUTINE X 2)  CULTURE, BLOOD (ROUTINE X 2)  LIPASE, BLOOD  HCG, QUANTITATIVE, PREGNANCY  LACTIC ACID, PLASMA  LACTIC ACID, PLASMA     MDM  Patient found to have leukocytosis of 18.7.  Mild hypokalemia at 3.3.  Creatinine mildly elevated from her baseline of 1.14 from baseline of 0.8.  BUN normal.  Glucose 172.  T. bili 1.6.  Hemoglobin with signs of anemia but is stable at 11.3.  Lipase normal.  On reevaluation improvement of her pain following IV Dilaudid and antiemetics.  Continues to be tachycardic, afebrile and given another 1 L fluids.  Clinical Course as of 09/26/24 9060  [place] Sep 26, 2024  9062 CT scan concern for 10 mm kidney stone with moderate hydronephrosis and perinephric stranding.  UA concerning for urinary tract infection with with positive nitrites and leukocyte esterase.  Does have significant leukocytosis of 18.7.  Adding on blood cultures and a lactic acid.  Started on IV Rocephin .  On chart review no history of a resistant urinary tract infection.  Consulted urology with Dr. Georganne  [SM]    Clinical Course User Index [SM] Suzanne Kirsch, MD     PROCEDURES:  Critical Care performed: yes  .Critical Care  Performed by: Suzanne Kirsch, MD Authorized by: Suzanne Kirsch, MD   Critical care provider statement:    Critical care time (minutes):  40   Critical care time was exclusive of:  Separately billable procedures and treating other patients   Critical care was necessary to  treat or prevent imminent or life-threatening deterioration of the following conditions:  Sepsis   Critical care was time spent personally by me on the following activities:  Development of treatment plan with patient or  surrogate, discussions with consultants, evaluation of patient's response to treatment, examination of patient, ordering and review of laboratory studies, ordering and review of radiographic studies, ordering and performing treatments and interventions, pulse oximetry, re-evaluation of patient's condition and review of old charts   Care discussed with: admitting provider     Patient's presentation is most consistent with acute presentation with potential threat to life or bodily function.   MEDICATIONS ORDERED IN ED: Medications  ondansetron  (ZOFRAN ) injection 4 mg (has no administration in time range)  HYDROmorphone (DILAUDID) injection 0.5 mg (has no administration in time range)  lactated ringers  bolus 1,000 mL (has no administration in time range)  lactated ringers  infusion (has no administration in time range)  cefTRIAXone  (ROCEPHIN ) 2 g in sodium chloride  0.9 % 100 mL IVPB (has no administration in time range)  ondansetron  (ZOFRAN ) injection 4 mg (4 mg Intravenous Given 09/26/24 0728)  HYDROmorphone (DILAUDID) injection 0.5 mg (0.5 mg Intravenous Given 09/26/24 0729)  sodium chloride  0.9 % bolus 1,000 mL (0 mLs Intravenous Stopped 09/26/24 0909)  sodium chloride  0.9 % bolus 1,000 mL (1,000 mLs Intravenous New Bag/Given 09/26/24 0907)  iohexol  (OMNIPAQUE ) 300 MG/ML solution 100 mL (100 mLs Intravenous Contrast Given 09/26/24 0849)    FINAL CLINICAL IMPRESSION(S) / ED DIAGNOSES   Final diagnoses:  Pyelonephritis  Kidney stone     Rx / DC Orders   ED Discharge Orders     None        Note:  This document was prepared using Dragon voice recognition software and may include unintentional dictation errors.   Suzanne Kirsch, MD 09/26/24 [phone]

## 2024-09-26 NOTE — Progress Notes (Signed)
 PHARMACY - PHYSICIAN COMMUNICATION CRITICAL VALUE ALERT - BLOOD CULTURE IDENTIFICATION (BCID)  Patricia Henson is an 33 y.o. female who presented to [facility] on 09/26/2024 with a chief complaint of flank pain dysuria fever and chills.   Assessment:  BCID = E.coli 2/4 bottles (anaerobic and aerobic in one set) no resistance detected  (Name of physician (or Provider) ContactedBETHA Peaches, Jan.  Current antibiotics: Rocephin  2G q24h  Changes to prescribed antibiotics recommended:  Patient is on recommended antibiotics - No changes needed  Results for orders placed or performed during the hospital encounter of 09/26/24  Blood Culture ID Panel (Reflexed) (Collected: 09/26/2024 10:15 AM)  Result Value Ref Range   Enterococcus faecalis NOT DETECTED NOT DETECTED   Enterococcus Faecium NOT DETECTED NOT DETECTED   Listeria monocytogenes NOT DETECTED NOT DETECTED   Staphylococcus species NOT DETECTED NOT DETECTED   Staphylococcus aureus (BCID) NOT DETECTED NOT DETECTED   Staphylococcus epidermidis NOT DETECTED NOT DETECTED   Staphylococcus lugdunensis NOT DETECTED NOT DETECTED   Streptococcus species NOT DETECTED NOT DETECTED   Streptococcus agalactiae NOT DETECTED NOT DETECTED   Streptococcus pneumoniae NOT DETECTED NOT DETECTED   Streptococcus pyogenes NOT DETECTED NOT DETECTED   A.calcoaceticus-baumannii NOT DETECTED NOT DETECTED   Bacteroides fragilis NOT DETECTED NOT DETECTED   Enterobacterales DETECTED (A) NOT DETECTED   Enterobacter cloacae complex NOT DETECTED NOT DETECTED   Escherichia coli DETECTED (A) NOT DETECTED   Klebsiella aerogenes NOT DETECTED NOT DETECTED   Klebsiella oxytoca NOT DETECTED NOT DETECTED   Klebsiella pneumoniae NOT DETECTED NOT DETECTED   Proteus species NOT DETECTED NOT DETECTED   Salmonella species NOT DETECTED NOT DETECTED   Serratia marcescens NOT DETECTED NOT DETECTED   Haemophilus influenzae NOT DETECTED NOT DETECTED   Neisseria meningitidis NOT  DETECTED NOT DETECTED   Pseudomonas aeruginosa NOT DETECTED NOT DETECTED   Stenotrophomonas maltophilia NOT DETECTED NOT DETECTED   Candida albicans NOT DETECTED NOT DETECTED   Candida auris NOT DETECTED NOT DETECTED   Candida glabrata NOT DETECTED NOT DETECTED   Candida krusei NOT DETECTED NOT DETECTED   Candida parapsilosis NOT DETECTED NOT DETECTED   Candida tropicalis NOT DETECTED NOT DETECTED   Cryptococcus neoformans/gattii NOT DETECTED NOT DETECTED   CTX-M ESBL NOT DETECTED NOT DETECTED   Carbapenem resistance IMP NOT DETECTED NOT DETECTED   Carbapenem resistance KPC NOT DETECTED NOT DETECTED   Carbapenem resistance NDM NOT DETECTED NOT DETECTED   Carbapenem resist OXA 48 LIKE NOT DETECTED NOT DETECTED   Carbapenem resistance VIM NOT DETECTED NOT DETECTED    Kion Huntsberry N Alvin Diffee 09/26/2024  10:30 PM

## 2024-09-26 NOTE — Sepsis Progress Note (Signed)
 Contacted floor staff for BP,  lactate not up yet.

## 2024-09-26 NOTE — Op Note (Signed)
 Preoperative diagnosis:  Left ureteral stone   Postoperative diagnosis:  Left ureteral stone   Procedure:  Cystoscopy Left ureteral stent placement Left retrograde pyelography with interpretation   Surgeon: Penne Skye, MD  Anesthesia: General  Complications: None  Intraoperative findings: Significant impaction of left distal ureteral stone- required angled glidewire and several attempts to pass wire Turbid left proximal hydronephrotic drip - sent for separate culture Successful placement of a Left ureteral stent without string  EBL: Minimal  Specimens:  Left proximal hydronephrotic driop  Indication: Patricia Henson is a 33 y.o. patient with:  Acute on chronic 10mm Left distal ureteral stone (known since June 2025) Acute superimposed UTI on chronic obstruction  UA infected, tachycardic, WBC 19   After reviewing the management options for treatment, he elected to proceed with the above surgical procedure(s). We have discussed the potential benefits and risks of the procedure, side effects of the proposed treatment, the likelihood of the patient achieving the goals of the procedure, and any potential problems that might occur during the procedure or recuperation. Informed consent has been obtained.  Description of procedure:  The patient was taken to the operating room and general anesthesia was induced.  The patient was placed in the dorsal lithotomy position, prepped and draped in the usual sterile fashion, and preoperative antibiotics were administered. A preoperative time-out was performed.   Cystourethroscopy was performed.  The patient's urethra was examined and was normal. The bladder was then systematically examined in its entirety. There was no evidence for any bladder tumors, stones, or other mucosal pathology.  Bilateral ureteral orifices noted in orthotopic location.  Attention then turned to the left ureteral orifice and a ureteral catheter was used to  intubate the ureteral orifice.  We had initial difficulty passing a wire proximal to the stone location, which was noted on spot KUB to be ~1-2 cm proximal to the UVJ. Suspect significant stone impaction from longstanding obstructive stone at this location. Ultimately, able to pass an angled glidewire proximally, followed by an open ended catheter  The open ended catheter was introduced proximal to the stone location and a turbid hydronephrotic drip was collected and sent for separate urine culture.   Omnipaque  contrast was injected through the ureteral catheter and a retrograde pyelogram was performed with findings noted: moderate left hydronephrosis.   A sensor guidewire was then advanced up the left ureter into the renal pelvis under fluoroscopic guidance.  We placed a 44F x 24 cm JJ stent  without string under fluoroscopic guidance. Unfortunately, our fixed fluoroscopy unit incurred an unrecoverable fault during our last step, unable to perform a final spot KUB to confirm appropriate proximal curl.   The bladder was then emptied and the procedure ended.  The patient appeared to tolerate the procedure well and without complications.  The patient was able to be awakened and transferred to the recovery unit in satisfactory condition.   Plan: Admit for medical observation, empiric antibiotics, follow up culture data. At discharge: will need follow up with Urology in ~1-2 weeks for definitive stone treatment  Penne Skye, M.D.

## 2024-09-26 NOTE — H&P (Signed)
 History and Physical    Patricia Henson FMW:[id number] DOB: 21-Aug-1991 DOA: 09/26/2024  PCP: Pcp, No (Confirm with patient/family/NH records and if not entered, this has to be entered at [facility] point of entry) Patient coming from: Home  I have personally briefly reviewed patient's old medical records in [facility]  Chief Complaint: Flank pain dysuria, chills  HPI: Patricia Henson is a 33 y.o. female with medical history significant of HTN, sickle cell trait, history of recurrent kidney stones, presented with new onset of flank pain dysuria fever and chills.  Symptoms started 3 days ago patient started blood burning sensation when urinating and urinary frequency, some follow-up she started develop left-sided flank pain radiating to the groin area, associate with episode of subjective fever and chills.  Feeling nausea but no vomiting, no diarrhea.  ED Course: Temperature 1.8, tachycardia heart rate 130, blood pressure 156/80, nonhypoxic.  CT abdomen pelvis showed obstructing left distal ureteral stone, with left-sided hydronephrosis and left kidney stranding, blood work showed WBC 18.7 hemoglobin 14 BUN 19 creatinine 1.1 glucose 133.  Patient was given IV bolus 1000 mL x2, and started on cefadroxil  Review of Systems: As per HPI otherwise 14 point review of systems negative.    Past Medical History:  Diagnosis Date   Anemia    Gestational diabetes    diet controlled   Headache    History of kidney stones    Kidney stone    Pregnancy induced hypertension     Past Surgical History:  Procedure Laterality Date   CHOLECYSTECTOMY     WISDOM TOOTH EXTRACTION       reports that she has been smoking cigarettes. She has never used smokeless tobacco. She reports current drug use. Drug: Marijuana. She reports that she does not drink alcohol.  Allergies  Allergen Reactions   Penicillins Dermatitis, Hives and Rash    Unsure reaction as a child, pt states hives but    Family History   Problem Relation Age of Onset   Diabetes Mother      Prior to Admission medications   Medication Sig Start Date End Date Taking? Authorizing Provider  ibuprofen  (ADVIL ) 800 MG tablet Take 800 mg by mouth 3 (three) times daily. Patient not taking: Reported on 09/26/2024 07/12/24   [provider]  ketorolac  (TORADOL ) 10 MG tablet Take 1 tablet (10 mg total) by mouth every 6 (six) hours as needed. Patient not taking: Reported on 09/26/2024 06/15/24   Francisca Redell BROCKS, MD  ondansetron  (ZOFRAN -ODT) 4 MG disintegrating tablet Take 1 tablet (4 mg total) by mouth every 8 (eight) hours as needed for nausea or vomiting. Patient not taking: Reported on 06/04/2024 05/24/24   Suzanne Kirsch, MD  phenazopyridine  (PYRIDIUM ) 200 MG tablet Take 200 mg by mouth 3 (three) times daily as needed. Patient not taking: Reported on 09/26/2024 09/19/24   [provider]  sulfamethoxazole-trimethoprim (BACTRIM DS) 800-160 MG tablet Take 1 tablet by mouth 2 (two) times daily. Patient not taking: Reported on 09/26/2024 09/19/24   [provider]    Physical Exam: Vitals:   09/26/24 0710 09/26/24 0711 09/26/24 0717 09/26/24 1039  BP:  (!) 146/74  (!) 156/138  Pulse:  (!) 120  (!) 130  Resp:  18  (!) 29  Temp:  98 F (36.7 C)    SpO2:  100% 100% 97%  Weight: 86.2 kg     Height: 5' 2 (1.575 m)       Constitutional: NAD, calm, comfortable  Vitals:   09/26/24 0710 09/26/24 0711 09/26/24 0717 09/26/24 1039  BP:  (!) 146/74  (!) 156/138  Pulse:  (!) 120  (!) 130  Resp:  18  (!) 29  Temp:  98 F (36.7 C)    SpO2:  100% 100% 97%  Weight: 86.2 kg     Height: 5' 2 (1.575 m)      Eyes: PERRL, lids and conjunctivae normal ENMT: Mucous membranes are dry. Posterior pharynx clear of any exudate or lesions.Normal dentition.  Neck: normal, supple, no masses, no thyromegaly Respiratory: clear to auscultation bilaterally, no wheezing, no crackles. Normal respiratory effort. No accessory muscle  use.  Cardiovascular: Regular rate and rhythm, no murmurs / rubs / gallops. No extremity edema. 2+ pedal pulses. No carotid bruits.  Abdomen: Left CVA tenderness, no masses palpated. No hepatosplenomegaly. Bowel sounds positive.  Musculoskeletal: no clubbing / cyanosis. No joint deformity upper and lower extremities. Good ROM, no contractures. Normal muscle tone.  Skin: no rashes, lesions, ulcers. No induration Neurologic: CN 2-12 grossly intact. Sensation intact, DTR normal. Strength 5/5 in all 4.  Psychiatric: Normal judgment and insight. Alert and oriented x 3. Normal mood.     Labs on Admission: I have personally reviewed following labs and imaging studies  CBC: Recent Labs  Lab 09/26/24 0719  WBC 18.7*  HGB 11.3*  HCT 31.4*  MCV 76.2*  PLT 202   Basic Metabolic Panel: Recent Labs  Lab 09/26/24 0719  NA 136  K 3.3*  CL 100  CO2 21*  GLUCOSE 172*  BUN 19  CREATININE 1.14*  CALCIUM 9.6   GFR: Estimated Creatinine Clearance: 72.1 mL/min (A) (by C-G formula based on SCr of 1.14 mg/dL (H)). Liver Function Tests: Recent Labs  Lab 09/26/24 0719  AST 27  ALT 19  ALKPHOS 76  BILITOT 1.6*  PROT 8.1  ALBUMIN 3.5   Recent Labs  Lab 09/26/24 0719  LIPASE 22   No results for input(s): AMMONIA  in the last 168 hours. Coagulation Profile: No results for input(s): INR, PROTIME in the last 168 hours. Cardiac Enzymes: No results for input(s): CKTOTAL, CKMB, CKMBINDEX, TROPONINI in the last 168 hours. BNP (last 3 results) No results for input(s): PROBNP in the last 8760 hours. HbA1C: No results for input(s): HGBA1C in the last 72 hours. CBG: No results for input(s): GLUCAP in the last 168 hours. Lipid Profile: No results for input(s): CHOL, HDL, LDLCALC, TRIG, CHOLHDL, LDLDIRECT in the last 72 hours. Thyroid Function Tests: No results for input(s): TSH, T4TOTAL, FREET4, T3FREE, THYROIDAB in the last 72 hours. Anemia  Panel: No results for input(s): VITAMINB12, FOLATE, FERRITIN, TIBC, IRON, RETICCTPCT in the last 72 hours. Urine analysis:    Component Value Date/Time   COLORURINE AMBER (A) 09/26/2024 0719   APPEARANCEUR CLOUDY (A) 09/26/2024 0719   APPEARANCEUR Clear 06/03/2024 1320   LABSPEC 1.041 (H) 09/26/2024 0719   LABSPEC 1.021 08/23/2014 1039   PHURINE 5.0 09/26/2024 0719   GLUCOSEU NEGATIVE 09/26/2024 0719   GLUCOSEU Negative 08/23/2014 1039   HGBUR SMALL (A) 09/26/2024 0719   BILIRUBINUR NEGATIVE 09/26/2024 0719   BILIRUBINUR Negative 06/03/2024 1320   BILIRUBINUR Negative 08/23/2014 1039   KETONESUR 20 (A) 09/26/2024 0719   PROTEINUR 100 (A) 09/26/2024 0719   UROBILINOGEN 1.0 08/11/2011 2036   NITRITE POSITIVE (A) 09/26/2024 0719   LEUKOCYTESUR LARGE (A) 09/26/2024 0719   LEUKOCYTESUR 3+ 08/23/2014 1039    Radiological Exams on Admission: CT ABDOMEN PELVIS W CONTRAST Result Date: 09/26/2024 EXAM:  CT ABDOMEN AND PELVIS WITH CONTRAST 09/26/2024 08:55:34 AM TECHNIQUE: CT of the abdomen and pelvis was performed with the administration of 100 mL of iohexol  (OMNIPAQUE ) 300 MG/ML solution. Multiplanar reformatted images are provided for review. Automated exposure control, iterative reconstruction, and/or weight-based adjustment of the mA/kV was utilized to reduce the radiation dose to as low as reasonably achievable. COMPARISON: CT of the abdomen and pelvis dated 08/24/2024. CLINICAL HISTORY: Abdominal pain, acute, nonlocalized; lower abd pain, dysuria, h/o stones, prior chole. FINDINGS: LOWER CHEST: Appears minimal atelectasis present independently within the lower lobes bilaterally. LIVER: The liver is unremarkable. GALLBLADDER AND BILE DUCTS: The gallbladder is absent. No biliary ductal dilatation. SPLEEN: No acute abnormality. PANCREAS: No acute abnormality. ADRENAL GLANDS: No acute abnormality. KIDNEYS, URETERS AND BLADDER: There is an ovoid calculus obstructing the left distal  ureter measuring approximately 10 x 5 x 8 mm. There is moderate left hydroureteronephrosis and perinephric stranding. There is also delayed left nephrogram. No stones in the right kidney or ureter. No right hydronephrosis or right perinephric/periureteral stranding. Urinary bladder is unremarkable. GI AND BOWEL: Stomach demonstrates no acute abnormality. There is no bowel obstruction. PERITONEUM AND RETROPERITONEUM: No ascites. No free air. VASCULATURE: Aorta is normal in caliber. LYMPH NODES: No lymphadenopathy. REPRODUCTIVE ORGANS: No acute abnormality. BONES AND SOFT TISSUES: No acute osseous abnormality. No focal soft tissue abnormality. IMPRESSION: 1. Ovoid calculus obstructing the left distal ureter measuring approximately 10 x 5 x 8 mm with moderate left hydroureteronephrosis, perinephric stranding, and delayed left nephrogram. Electronically signed by: Evalene Coho MD 09/26/2024 09:21 AM EST RP Workstation: [id number]    EKG: None  Assessment/Plan Principal Problem:   Ureteral stone Active Problems:   Acute pyelonephritis   Ureteral stone with hydronephrosis   Pyelonephritis  (please populate well all problems here in Problem List. (For example, if patient is on BP meds at home and you resume or decide to hold them, it is a problem that needs to be her. Same for CAD, COPD, HLD and so on)  Sepsis without acute endorgan damage - Sepsis as evidenced by new onset fever, tachycardia leukocytosis, source of infection less likely complicated UTI and left-sided pyelonephritis - Continue IV fluid resuscitation, continue ceftriaxone  - Urine culture in progress  Left-sided obstructive uropathy Obstructive left ureteral stone -N.p.o., OR this afternoon for cystoscope and stenting  Hypokalemia - P.o. replacement, recheck BMP tomorrow  HTN - Hold off home BP meds - Start as needed labetalol    DVT prophylaxis: SCD Code Status: Full code Family Communication: None at bedside Disposition  Plan: Expect more than 2 midnight hospital, patient has acute pyelonephritis obstructive stone requiring inpatient urology intervention IV fluid, and sepsis management, expect more than 2 midnight hospital stay Consults called: Urology Admission status: Telemetry admission   Cort ONEIDA Mana MD [facility] Pager [phone]  09/26/2024, 10:45 AM

## 2024-09-26 NOTE — Progress Notes (Signed)
 CODE SEPSIS - PHARMACY COMMUNICATION  **Broad Spectrum Antibiotics should be administered within 1 hour of Sepsis diagnosis**  Time Code Sepsis Called/Page Received: 09:35  Antibiotics Ordered: Ceftriaxone  2 g IV q24h   Time of 1st antibiotic administration: 10:36  Additional action taken by pharmacy:   If necessary, Name of Provider/Nurse Contacted:    Ransom Blanch PGY-1 Pharmacy Resident  [facility]  09/26/2024 10:45 AM

## 2024-09-26 NOTE — Consult Note (Signed)
 Urology Consult   I have been asked to see the patient by Dr. Suzanne, for evaluation and management of acute obstructed Left ureteral stone, UTI.  HPI:  Patricia Henson is a 33 y.o. year old  ED visit [facility] (09/26/24) - 3-day abdominal pain  Saw Dr. Francisca in July 2025 - for the same distal left ureteral stone. Scheduled for surgery although lost to follow up.    - patient's mother was in stroke ICU in [place]   Interestingly, patient denies acute stone events, left flank pain, prior admissions at outside hospital for her known stone since July.  Denies any outside urology evaluation or interval surgeries  She is in moderate pain and distress on my exam       PMH: Past Medical History:  Diagnosis Date   Anemia    Gestational diabetes    diet controlled   Headache    History of kidney stones    Kidney stone    Pregnancy induced hypertension     Surgical History: Past Surgical History:  Procedure Laterality Date   CHOLECYSTECTOMY     WISDOM TOOTH EXTRACTION      Allergies:  Allergies  Allergen Reactions   Penicillins Rash, Dermatitis and Hives    Unsure reaction as a child, pt states hives but    Family History: Family History  Problem Relation Age of Onset   Diabetes Mother     Social History:  reports that she has been smoking cigarettes. She has never used smokeless tobacco. She reports current drug use. Drug: Marijuana. She reports that she does not drink alcohol.  ROS: Negative aside from those stated in the HPI.  Physical Exam: BP (!) 146/74   Pulse (!) 120   Temp 98 F (36.7 C)   Resp 18   Ht 5' 2 (1.575 m)   Wt 86.2 kg   LMP 09/08/2024 (Approximate)   SpO2 100%   BMI 34.75 kg/m    Constitutional:  Alert and oriented, No acute distress. Cardiovascular: No clubbing, cyanosis, or edema. Respiratory: Normal respiratory effort, no increased work of breathing. GI: Abdomen is soft, nontender, nondistended, no abdominal masses Lymph: No  cervical or inguinal lymphadenopathy. Skin: No rashes, bruises or suspicious lesions. Neurologic: Grossly intact, no focal deficits, moving all 4 extremities. Psychiatric: Normal mood and affect.  Laboratory Data:  Latest Reference Range & Units 09/26/24 07:19  Creatinine 0.44 - 1.00 mg/dL 8.85 (H)  (H): Data is abnormally high   Latest Reference Range & Units 09/26/24 07:19  WBC 4.0 - 10.5 K/uL 18.7 (H)  (H): Data is abnormally high  Pertinent Imaging: I have personally reviewed the CT Stone (09/26/24) - Appears to have a distal left 10mm ureteral stone.There is proximal periureteral stranding/enhancement and mild left hydro. Notably, this is consistent with her prior scan from June 2025 with similar findings, same Left distal 10mm calcification.   IMPRESSION: 1. Ovoid calculus obstructing the left distal ureter measuring approximately 10 x 5 x 8 mm with moderate left hydroureteronephrosis, perinephric stranding, and delayed left nephrogram.  .     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 She seems to have a known left distal ureteral stone since June 2025- and was offered surgery in July, but lost to follow up. Imaging is most likely consistent with chronic  obstruction, perhaps with acute UTI + pain exacerbation in the last couple of days. Plan to proceed with ureteral stent placement and decompression, followed by admission and antibiotics. She will need Left URS/LL in ~2-3 weeks. Reviewed the procedure in detail with patient, including peri-op course, outcomes, recovery and possible complications. All questions answered  - Proceed to OR: cystoscopy, Left ureteral stent placement, Left RPG - Admit for medical observation following, antibiotics, follow up cultures - Long-term: will need definitive  stone management in ~2 weeks, will schedule at discharge  Penne JONELLE Skye, MD  [facility] [address] [place], [place] [address] [phone]

## 2024-09-26 NOTE — Anesthesia Preprocedure Evaluation (Signed)
 Anesthesia Evaluation  Patient identified by MRN, date of birth, ID band Patient awake    Reviewed: Allergy & Precautions, H&P , NPO status , Patient's Chart, lab work & pertinent test results, reviewed documented beta blocker date and time   Airway Mallampati: II  TM Distance: >3 FB Neck ROM: full    Dental  (+) Teeth Intact   Pulmonary neg pulmonary ROS, Current Smoker   Pulmonary exam normal        Cardiovascular Exercise Tolerance: Good hypertension, On Medications negative cardio ROS Normal cardiovascular exam Rate:Normal     Neuro/Psych  Headaches  negative psych ROS   GI/Hepatic negative GI ROS, Neg liver ROS,,,  Endo/Other  negative endocrine ROSdiabetes, Well Controlled    Renal/GU ARFRenal disease  negative genitourinary   Musculoskeletal   Abdominal   Peds  Hematology  (+) Blood dyscrasia, anemia   Anesthesia Other Findings   Reproductive/Obstetrics negative OB ROS                              Anesthesia Physical Anesthesia Plan  ASA: 2 and emergent  Anesthesia Plan: General LMA   Post-op Pain Management:    Induction:   PONV Risk Score and Plan: 3  Airway Management Planned:   Additional Equipment:   Intra-op Plan:   Post-operative Plan:   Informed Consent: I have reviewed the patients History and Physical, chart, labs and discussed the procedure including the risks, benefits and alternatives for the proposed anesthesia with the patient or authorized representative who has indicated his/her understanding and acceptance.       Plan Discussed with: CRNA  Anesthesia Plan Comments:         Anesthesia Quick Evaluation

## 2024-09-26 NOTE — Progress Notes (Signed)
 Attempted to call patient's primary contact, her boyfriend Raiford, x2 but sent straight to a full voicemail box. Updating that patient did well with surgery and will be moving to recovery now.

## 2024-09-26 NOTE — Transfer of Care (Signed)
 Immediate Anesthesia Transfer of Care Note  Patient: Patricia Henson  Procedure(s) Performed: CYSTOSCOPY, WITH RETROGRADE PYELOGRAM AND URETERAL STENT INSERTION (Left: Ureter)  Patient Location: PACU  Anesthesia Type:General  Level of Consciousness: awake  Airway & Oxygen Therapy: Patient Spontanous Breathing and Patient connected to face mask oxygen  Post-op Assessment: Report given to RN and Post -op Vital signs reviewed and stable  Post vital signs: Reviewed and stable  Last Vitals:  Vitals Value Taken Time  BP 87/47 09/26/24 14:07  Temp 36.7 C 09/26/24 14:07  Pulse 108 09/26/24 14:08  Resp 18 09/26/24 14:08  SpO2 100 % 09/26/24 14:08  Vitals shown include unfiled device data.  Last Pain:  Vitals:   09/26/24 1407  TempSrc:   PainSc: Asleep         Complications: No notable events documented.

## 2024-09-26 NOTE — Sepsis Progress Note (Signed)
 eLink Sepsis tracking per protocol.

## 2024-09-27 ENCOUNTER — Other Ambulatory Visit: Payer: Self-pay | Admitting: Physician Assistant

## 2024-09-27 ENCOUNTER — Encounter: Payer: Self-pay | Admitting: Urology

## 2024-09-27 DIAGNOSIS — A4151 Sepsis due to Escherichia coli [E. coli]: Secondary | ICD-10-CM | POA: Diagnosis not present

## 2024-09-27 DIAGNOSIS — N132 Hydronephrosis with renal and ureteral calculous obstruction: Secondary | ICD-10-CM

## 2024-09-27 DIAGNOSIS — N201 Calculus of ureter: Secondary | ICD-10-CM | POA: Diagnosis not present

## 2024-09-27 DIAGNOSIS — N390 Urinary tract infection, site not specified: Secondary | ICD-10-CM | POA: Diagnosis not present

## 2024-09-27 LAB — CBC
HCT: 27.3 % — ABNORMAL LOW (ref 36.0–46.0)
Hemoglobin: 9.7 g/dL — ABNORMAL LOW (ref 12.0–15.0)
MCH: 27.2 pg (ref 26.0–34.0)
MCHC: 35.5 g/dL (ref 30.0–36.0)
MCV: 76.7 fL — ABNORMAL LOW (ref 80.0–100.0)
Platelets: 172 K/uL (ref 150–400)
RBC: 3.56 MIL/uL — ABNORMAL LOW (ref 3.87–5.11)
RDW: 14.9 % (ref 11.5–15.5)
WBC: 16 K/uL — ABNORMAL HIGH (ref 4.0–10.5)
nRBC: 0 % (ref 0.0–0.2)

## 2024-09-27 LAB — BASIC METABOLIC PANEL WITH GFR
Anion gap: 6 (ref 5–15)
BUN: 14 mg/dL (ref 6–20)
CO2: 24 mmol/L (ref 22–32)
Calcium: 9.2 mg/dL (ref 8.9–10.3)
Chloride: 107 mmol/L (ref 98–111)
Creatinine, Ser: 0.55 mg/dL (ref 0.44–1.00)
GFR, Estimated: >60 mL/min (ref >60)
Glucose, Bld: 148 mg/dL — ABNORMAL HIGH (ref 70–99)
Potassium: 4.2 mmol/L (ref 3.5–5.1)
Sodium: 137 mmol/L (ref 135–145)

## 2024-09-27 LAB — URINE CULTURE: Culture: NO GROWTH

## 2024-09-27 MED ORDER — OXYBUTYNIN CHLORIDE 5 MG PO TABS
5.0000 mg | ORAL_TABLET | Freq: Three times a day (TID) | ORAL | Status: DC | PRN
  Administered 2024-09-27 – 2024-09-29 (×4): 5 mg via ORAL
  Filled 2024-09-27 (×4): qty 1

## 2024-09-27 MED ORDER — ORAL CARE MOUTH RINSE: 15.0000 mL | OROMUCOSAL | Status: DC | PRN

## 2024-09-27 MED ORDER — OXYCODONE-ACETAMINOPHEN 5-325 MG PO TABS
1.0000 | ORAL_TABLET | ORAL | Status: DC | PRN
  Administered 2024-09-27 – 2024-09-28 (×3): 1 via ORAL
  Filled 2024-09-27 (×3): qty 1

## 2024-09-27 MED ORDER — TAMSULOSIN HCL 0.4 MG PO CAPS
0.4000 mg | ORAL_CAPSULE | Freq: Every day | ORAL | Status: DC
  Administered 2024-09-27 – 2024-09-29 (×3): 0.4 mg via ORAL
  Filled 2024-09-27 (×3): qty 1

## 2024-09-27 NOTE — Hospital Course (Signed)
 Patricia Henson is a 33 y.o. female with medical history significant of HTN, sickle cell trait, history of recurrent kidney stones, presented with new onset of flank pain dysuria fever and chills.  CT abdomen pelvis showed obstructing left distal ureteral stone, with left-sided hydronephrosis and left kidney stranding. Patient started on IV antibiotics, ureteral stent was placed 11/2.

## 2024-09-27 NOTE — Progress Notes (Signed)
 Pt states she feels like she is not emptying her bladder, bladder scanned 3cc's

## 2024-09-27 NOTE — Progress Notes (Signed)
  Progress Note   Patient: Patricia Henson FMW:[id number] DOB: 1991-06-03 DOA: 09/26/2024     1 DOS: the patient was seen and examined on 09/27/2024   Brief hospital course: VELVA MOLINARI is a 33 y.o. female with medical history significant of HTN, sickle cell trait, history of recurrent kidney stones, presented with new onset of flank pain dysuria fever and chills.  CT abdomen pelvis showed obstructing left distal ureteral stone, with left-sided hydronephrosis and left kidney stranding. Patient started on IV antibiotics, ureteral stent was placed 11/2.   Principal Problem:   Ureteral stone Active Problems:   Acute pyelonephritis   Ureteral stone with hydronephrosis   Pyelonephritis   Assessment and Plan: Sepsis secondary to pyelonephritis Acute pyelonephritis secondary to obstructing kidney stone. Left-sided obstructive uropathy Patient initially had high fever, tachycardia and erythrocytosis.  This is secondary to polynephritis, obstruction. Patient is status post ureteral stent, doing well, continue antibiotics, blood cultures so far has no growth, urine culture finalized, negative.  Class I obesity with BMI 34.75. Diet and excise.  Essential hypertension. Blood pressure medicine on hold.  Hypokalemia. Improved    Subjective:  Patient still has some mild flank pain.  No fever or chills.  Physical Exam: Vitals:   09/26/24 1506 09/26/24 2041 09/27/24 0402 09/27/24 0752  BP: 112/68 117/70 120/80 127/81  Pulse: (!) 107 89 73 83  Resp: 20  16 17   Temp: 98.7 F (37.1 C) 98.4 F (36.9 C) 98.6 F (37 C) 98.1 F (36.7 C)  TempSrc:      SpO2: 99% 97% 100% 100%  Weight:      Height:       General exam: Appears calm and comfortable  Respiratory system: Clear to auscultation. Respiratory effort normal. Cardiovascular system: S1 & S2 heard, RRR. No JVD, murmurs, rubs, gallops or clicks. No pedal edema. Gastrointestinal system: Abdomen is nondistended, soft and  nontender. No organomegaly or masses felt. Normal bowel sounds heard. Central nervous system: Alert and oriented. No focal neurological deficits. Extremities: Symmetric 5 x 5 power. Skin: No rashes, lesions or ulcers Psychiatry: Judgement and insight appear normal. Mood & affect appropriate.    Data Reviewed:  Lab results reviewed.  Family Communication: None  Disposition: Status is: Inpatient Remains inpatient appropriate because: Severity of disease, IV treatment     Time spent: 35 minutes  Author: Murvin Mana, MD 09/27/2024 12:51 PM  For on call review [email or web address].

## 2024-09-27 NOTE — TOC CM/SW Note (Signed)
 Transition of Care [facility]) - Inpatient Brief Assessment   Patient Details  Name: CYNTHIE GARMON MRN: [id number] Date of Birth: [birth year]  Transition of Care [facility]) CM/SW Contact:    Corean ONEIDA Haddock, RN Phone Number: 09/27/2024, 4:01 PM   Clinical Narrative:  Transition of Care [facility]) Screening Note   Patient Details  Name: ULYANA PITONES Date of Birth: [birth year]   Transition of Care (TOC) CM/SW Contact:    Corean ONEIDA Haddock, RN Phone Number: 09/27/2024, 4:01 PM    Transition of Care Department [facility]) has reviewed patient and no TOC needs have been identified at this time. . If new patient transition needs arise, please place a TOC consult.  No PCP on file. List of local PCP added to AVS   Transition of Care Asessment: Insurance and Status: Insurance coverage has been reviewed Patient has primary care physician: No     Prior/Current Home Services: No current home services Social Drivers of Health Review: SDOH reviewed no interventions necessary Readmission risk has been reviewed: Yes Transition of care needs: no transition of care needs at this time

## 2024-09-27 NOTE — H&P (View-Only) (Signed)
 Urology Inpatient Progress Note  Subjective: No acute events overnight. She is afebrile, VSS. WBC count down, 16.0. Creatinine down, 0.55.  Voided urine culture pending; IntraOp urine culture pending; initial blood cultures growing E. coli; repeat blood culture pending with no growth at <24 hours; on antibiotics as below. She is spontaneously voiding. Today she reports ongoing nausea and dizziness.  She reported to nursing this morning a sensation of incomplete bladder emptying, though bladder scan was WNL.  I started her on Flomax  and oxybutynin several hours ago.  Anti-infectives: Anti-infectives (From admission, onward)    Start     Dose/Rate Route Frequency Ordered Stop   09/27/24 1000  cefTRIAXone  (ROCEPHIN ) 2 g in sodium chloride  0.9 % 100 mL IVPB        2 g 200 mL/hr over 30 Minutes Intravenous Every 24 hours 09/26/24 1008 10/01/24 0959   09/26/24 0945  cefTRIAXone  (ROCEPHIN ) 2 g in sodium chloride  0.9 % 100 mL IVPB        2 g 200 mL/hr over 30 Minutes Intravenous Once 09/26/24 0934 09/26/24 1120       Current Facility-Administered Medications  Medication Dose Route Frequency Provider Last Rate Last Admin   acetaminophen  (TYLENOL ) tablet 650 mg  650 mg Oral Q6H PRN Garren, Brandon R, MD   650 mg at 09/26/24 1036   Or   acetaminophen  (TYLENOL ) suppository 650 mg  650 mg Rectal Q6H PRN Georganne Penne SAUNDERS, MD       cefTRIAXone  (ROCEPHIN ) 2 g in sodium chloride  0.9 % 100 mL IVPB  2 g Intravenous Q24H Georganne Penne SAUNDERS, MD 200 mL/hr at 09/27/24 0804 2 g at 09/27/24 0804   HYDROmorphone (DILAUDID) injection 1 mg  1 mg Intravenous Q3H PRN Garren, Brandon R, MD   1 mg at 09/27/24 0601   ketorolac  (TORADOL ) 30 MG/ML injection 30 mg  30 mg Intravenous Q6H PRN Garren, Brandon R, MD   30 mg at 09/27/24 0809   labetalol  (NORMODYNE ) injection 10 mg  10 mg Intravenous Q4H PRN Georganne Penne SAUNDERS, MD       ondansetron  (ZOFRAN ) tablet 4 mg  4 mg Oral Q6H PRN Georganne Penne SAUNDERS, MD       Or    ondansetron  (ZOFRAN ) injection 4 mg  4 mg Intravenous Q6H PRN Georganne Penne SAUNDERS, MD   4 mg at 09/26/24 1852   senna-docusate (Senokot-S) tablet 1 tablet  1 tablet Oral QHS PRN Georganne Penne SAUNDERS, MD       sodium chloride  0.9 % bolus 1,000 mL  1,000 mL Intravenous Once Georganne Penne SAUNDERS, MD        Objective: Vital signs in last 24 hours: Temp:  [98.1 F (36.7 C)-101.8 F (38.8 C)] 98.1 F (36.7 C) (11/03 0752) Pulse Rate:  [73-132] 83 (11/03 0752) Resp:  [12-29] 17 (11/03 0752) BP: (87-167)/(47-138) 127/81 (11/03 0752) SpO2:  [93 %-100 %] 100 % (11/03 0752)  Intake/Output from previous day: 11/02 0701 - 11/03 0700 In: 2429.1 [I.V.:1430.1; IV Piggyback:999] Out: -  Intake/Output this shift: No intake/output data recorded.  Physical Exam Vitals and nursing note reviewed.  Constitutional:      General: She is not in acute distress.    Appearance: She is not ill-appearing, toxic-appearing or diaphoretic.  HENT:     Head: Normocephalic and atraumatic.  Pulmonary:     Effort: Pulmonary effort is normal. No respiratory distress.  Skin:    General: Skin is warm and dry.  Neurological:     Mental  Status: She is alert and oriented to person, place, and time.  Psychiatric:        Mood and Affect: Mood normal.        Behavior: Behavior normal.    Lab Results:  Recent Labs    09/26/24 0719 09/27/24 0522  WBC 18.7* 16.0*  HGB 11.3* 9.7*  HCT 31.4* 27.3*  PLT 202 172   BMET Recent Labs    09/26/24 0719 09/27/24 0522  NA 136 137  K 3.3* 4.2  CL 100 107  CO2 21* 24  GLUCOSE 172* 148*  BUN 19 14  CREATININE 1.14* 0.55  CALCIUM 9.6 9.2   Studies/Results: DG OR UROLOGY CYSTO IMAGE ([facility] ONLY) Result Date: 09/26/2024 There is no interpretation for this exam.  This order is for images obtained during a surgical procedure.  Please See Surgeries Tab for more information regarding the procedure.   CT ABDOMEN PELVIS W CONTRAST Result Date: 09/26/2024 EXAM: CT ABDOMEN AND  PELVIS WITH CONTRAST 09/26/2024 08:55:34 AM TECHNIQUE: CT of the abdomen and pelvis was performed with the administration of 100 mL of iohexol  (OMNIPAQUE ) 300 MG/ML solution. Multiplanar reformatted images are provided for review. Automated exposure control, iterative reconstruction, and/or weight-based adjustment of the mA/kV was utilized to reduce the radiation dose to as low as reasonably achievable. COMPARISON: CT of the abdomen and pelvis dated 08/24/2024. CLINICAL HISTORY: Abdominal pain, acute, nonlocalized; lower abd pain, dysuria, h/o stones, prior chole. FINDINGS: LOWER CHEST: Appears minimal atelectasis present independently within the lower lobes bilaterally. LIVER: The liver is unremarkable. GALLBLADDER AND BILE DUCTS: The gallbladder is absent. No biliary ductal dilatation. SPLEEN: No acute abnormality. PANCREAS: No acute abnormality. ADRENAL GLANDS: No acute abnormality. KIDNEYS, URETERS AND BLADDER: There is an ovoid calculus obstructing the left distal ureter measuring approximately 10 x 5 x 8 mm. There is moderate left hydroureteronephrosis and perinephric stranding. There is also delayed left nephrogram. No stones in the right kidney or ureter. No right hydronephrosis or right perinephric/periureteral stranding. Urinary bladder is unremarkable. GI AND BOWEL: Stomach demonstrates no acute abnormality. There is no bowel obstruction. PERITONEUM AND RETROPERITONEUM: No ascites. No free air. VASCULATURE: Aorta is normal in caliber. LYMPH NODES: No lymphadenopathy. REPRODUCTIVE ORGANS: No acute abnormality. BONES AND SOFT TISSUES: No acute osseous abnormality. No focal soft tissue abnormality. IMPRESSION: 1. Ovoid calculus obstructing the left distal ureter measuring approximately 10 x 5 x 8 mm with moderate left hydroureteronephrosis, perinephric stranding, and delayed left nephrogram. Electronically signed by: Evalene Coho MD 09/26/2024 09:21 AM EST RP Workstation: [id number]   Assessment &  Plan: 33 y.o. 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 Georganne for management of an obstructing 10 mm distal left ureteral stone with sepsis due to E. coli UTI.  She is clinically improving on empiric antibiotics.  She is tolerating her stent with some bladder discomfort.  We discussed that she will require 10-14 days of culture-appropriate antibiotics to treat her infection, followed by outpatient left ureteroscopy with laser lithotripsy and stent exchange in 2-3 weeks to treat her stone. She expressed understanding.  Recommendations: -Continue empiric antibiotics and follow cultures for a total of 10-14 days of culture-appropriate therapy. -Continue Flomax  0.4mg  daily and oxybutynin 5mg  every 8 hours as needed for stent discomfort. -Outpatient left URS/LL/stent exchange with Dr. Georganne in 2-3 weeks; our scheduler will contact her after discharge to arrange  Lucie Hones, PA-C 09/27/2024

## 2024-09-27 NOTE — Progress Notes (Signed)
 Urology Inpatient Progress Note  Subjective: No acute events overnight. She is afebrile, VSS. WBC count down, 16.0. Creatinine down, 0.55.  Voided urine culture pending; IntraOp urine culture pending; initial blood cultures growing E. coli; repeat blood culture pending with no growth at <24 hours; on antibiotics as below. She is spontaneously voiding. Today she reports ongoing nausea and dizziness.  She reported to nursing this morning a sensation of incomplete bladder emptying, though bladder scan was WNL.  I started her on Flomax  and oxybutynin several hours ago.  Anti-infectives: Anti-infectives (From admission, onward)    Start     Dose/Rate Route Frequency Ordered Stop   09/27/24 1000  cefTRIAXone  (ROCEPHIN ) 2 g in sodium chloride  0.9 % 100 mL IVPB        2 g 200 mL/hr over 30 Minutes Intravenous Every 24 hours 09/26/24 1008 10/01/24 0959   09/26/24 0945  cefTRIAXone  (ROCEPHIN ) 2 g in sodium chloride  0.9 % 100 mL IVPB        2 g 200 mL/hr over 30 Minutes Intravenous Once 09/26/24 0934 09/26/24 1120       Current Facility-Administered Medications  Medication Dose Route Frequency Provider Last Rate Last Admin   acetaminophen  (TYLENOL ) tablet 650 mg  650 mg Oral Q6H PRN Garren, Brandon R, MD   650 mg at 09/26/24 1036   Or   acetaminophen  (TYLENOL ) suppository 650 mg  650 mg Rectal Q6H PRN Georganne Penne SAUNDERS, MD       cefTRIAXone  (ROCEPHIN ) 2 g in sodium chloride  0.9 % 100 mL IVPB  2 g Intravenous Q24H Georganne Penne SAUNDERS, MD 200 mL/hr at 09/27/24 0804 2 g at 09/27/24 0804   HYDROmorphone (DILAUDID) injection 1 mg  1 mg Intravenous Q3H PRN Garren, Brandon R, MD   1 mg at 09/27/24 0601   ketorolac  (TORADOL ) 30 MG/ML injection 30 mg  30 mg Intravenous Q6H PRN Garren, Brandon R, MD   30 mg at 09/27/24 0809   labetalol  (NORMODYNE ) injection 10 mg  10 mg Intravenous Q4H PRN Georganne Penne SAUNDERS, MD       ondansetron  (ZOFRAN ) tablet 4 mg  4 mg Oral Q6H PRN Georganne Penne SAUNDERS, MD       Or    ondansetron  (ZOFRAN ) injection 4 mg  4 mg Intravenous Q6H PRN Georganne Penne SAUNDERS, MD   4 mg at 09/26/24 1852   senna-docusate (Senokot-S) tablet 1 tablet  1 tablet Oral QHS PRN Georganne Penne SAUNDERS, MD       sodium chloride  0.9 % bolus 1,000 mL  1,000 mL Intravenous Once Georganne Penne SAUNDERS, MD        Objective: Vital signs in last 24 hours: Temp:  [98.1 F (36.7 C)-101.8 F (38.8 C)] 98.1 F (36.7 C) (11/03 0752) Pulse Rate:  [73-132] 83 (11/03 0752) Resp:  [12-29] 17 (11/03 0752) BP: (87-167)/(47-138) 127/81 (11/03 0752) SpO2:  [93 %-100 %] 100 % (11/03 0752)  Intake/Output from previous day: 11/02 0701 - 11/03 0700 In: 2429.1 [I.V.:1430.1; IV Piggyback:999] Out: -  Intake/Output this shift: No intake/output data recorded.  Physical Exam Vitals and nursing note reviewed.  Constitutional:      General: She is not in acute distress.    Appearance: She is not ill-appearing, toxic-appearing or diaphoretic.  HENT:     Head: Normocephalic and atraumatic.  Pulmonary:     Effort: Pulmonary effort is normal. No respiratory distress.  Skin:    General: Skin is warm and dry.  Neurological:     Mental  Status: She is alert and oriented to person, place, and time.  Psychiatric:        Mood and Affect: Mood normal.        Behavior: Behavior normal.    Lab Results:  Recent Labs    09/26/24 0719 09/27/24 0522  WBC 18.7* 16.0*  HGB 11.3* 9.7*  HCT 31.4* 27.3*  PLT 202 172   BMET Recent Labs    09/26/24 0719 09/27/24 0522  NA 136 137  K 3.3* 4.2  CL 100 107  CO2 21* 24  GLUCOSE 172* 148*  BUN 19 14  CREATININE 1.14* 0.55  CALCIUM 9.6 9.2   Studies/Results: DG OR UROLOGY CYSTO IMAGE ([facility] ONLY) Result Date: 09/26/2024 There is no interpretation for this exam.  This order is for images obtained during a surgical procedure.  Please See Surgeries Tab for more information regarding the procedure.   CT ABDOMEN PELVIS W CONTRAST Result Date: 09/26/2024 EXAM: CT ABDOMEN AND  PELVIS WITH CONTRAST 09/26/2024 08:55:34 AM TECHNIQUE: CT of the abdomen and pelvis was performed with the administration of 100 mL of iohexol  (OMNIPAQUE ) 300 MG/ML solution. Multiplanar reformatted images are provided for review. Automated exposure control, iterative reconstruction, and/or weight-based adjustment of the mA/kV was utilized to reduce the radiation dose to as low as reasonably achievable. COMPARISON: CT of the abdomen and pelvis dated 08/24/2024. CLINICAL HISTORY: Abdominal pain, acute, nonlocalized; lower abd pain, dysuria, h/o stones, prior chole. FINDINGS: LOWER CHEST: Appears minimal atelectasis present independently within the lower lobes bilaterally. LIVER: The liver is unremarkable. GALLBLADDER AND BILE DUCTS: The gallbladder is absent. No biliary ductal dilatation. SPLEEN: No acute abnormality. PANCREAS: No acute abnormality. ADRENAL GLANDS: No acute abnormality. KIDNEYS, URETERS AND BLADDER: There is an ovoid calculus obstructing the left distal ureter measuring approximately 10 x 5 x 8 mm. There is moderate left hydroureteronephrosis and perinephric stranding. There is also delayed left nephrogram. No stones in the right kidney or ureter. No right hydronephrosis or right perinephric/periureteral stranding. Urinary bladder is unremarkable. GI AND BOWEL: Stomach demonstrates no acute abnormality. There is no bowel obstruction. PERITONEUM AND RETROPERITONEUM: No ascites. No free air. VASCULATURE: Aorta is normal in caliber. LYMPH NODES: No lymphadenopathy. REPRODUCTIVE ORGANS: No acute abnormality. BONES AND SOFT TISSUES: No acute osseous abnormality. No focal soft tissue abnormality. IMPRESSION: 1. Ovoid calculus obstructing the left distal ureter measuring approximately 10 x 5 x 8 mm with moderate left hydroureteronephrosis, perinephric stranding, and delayed left nephrogram. Electronically signed by: Evalene Coho MD 09/26/2024 09:21 AM EST RP Workstation: [id number]   Assessment &  Plan: 33 y.o. 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 Georganne for management of an obstructing 10 mm distal left ureteral stone with sepsis due to E. coli UTI.  She is clinically improving on empiric antibiotics.  She is tolerating her stent with some bladder discomfort.  We discussed that she will require 10-14 days of culture-appropriate antibiotics to treat her infection, followed by outpatient left ureteroscopy with laser lithotripsy and stent exchange in 2-3 weeks to treat her stone. She expressed understanding.  Recommendations: -Continue empiric antibiotics and follow cultures for a total of 10-14 days of culture-appropriate therapy. -Continue Flomax  0.4mg  daily and oxybutynin 5mg  every 8 hours as needed for stent discomfort. -Outpatient left URS/LL/stent exchange with Dr. Georganne in 2-3 weeks; our scheduler will contact her after discharge to arrange  Lucie Hones, PA-C 09/27/2024

## 2024-09-28 DIAGNOSIS — N10 Acute tubulo-interstitial nephritis: Secondary | ICD-10-CM | POA: Diagnosis not present

## 2024-09-28 DIAGNOSIS — N132 Hydronephrosis with renal and ureteral calculous obstruction: Secondary | ICD-10-CM | POA: Diagnosis not present

## 2024-09-28 LAB — URINE CULTURE: Culture: >=100000 — AB

## 2024-09-28 LAB — CBC
HCT: 25.1 % — ABNORMAL LOW (ref 36.0–46.0)
Hemoglobin: 8.8 g/dL — ABNORMAL LOW (ref 12.0–15.0)
MCH: 27.2 pg (ref 26.0–34.0)
MCHC: 35.1 g/dL (ref 30.0–36.0)
MCV: 77.7 fL — ABNORMAL LOW (ref 80.0–100.0)
Platelets: 184 K/uL (ref 150–400)
RBC: 3.23 MIL/uL — ABNORMAL LOW (ref 3.87–5.11)
RDW: 15.1 % (ref 11.5–15.5)
WBC: 11.6 K/uL — ABNORMAL HIGH (ref 4.0–10.5)
nRBC: 0 % (ref 0.0–0.2)

## 2024-09-28 LAB — IRON AND TIBC
Iron: 15 ug/dL — ABNORMAL LOW (ref 28–170)
Saturation Ratios: 7 % — ABNORMAL LOW (ref 10.4–31.8)
TIBC: 223 ug/dL — ABNORMAL LOW (ref 250–450)
UIBC: 208 ug/dL

## 2024-09-28 LAB — FERRITIN: Ferritin: 198 ng/mL (ref 11–307)

## 2024-09-28 LAB — LACTIC ACID, PLASMA: Lactic Acid, Venous: 1.2 mmol/L (ref 0.5–1.9)

## 2024-09-28 MED ORDER — LACTULOSE 10 GM/15ML PO SOLN
20.0000 g | Freq: Once | ORAL | Status: AC
  Administered 2024-09-28: 20 g via ORAL
  Filled 2024-09-28: qty 30

## 2024-09-28 MED ORDER — OXYCODONE-ACETAMINOPHEN 5-325 MG PO TABS
1.0000 | ORAL_TABLET | ORAL | Status: DC | PRN
Start: 2024-09-28 — End: 2024-09-29
  Administered 2024-09-28 – 2024-09-29 (×4): 2 via ORAL
  Filled 2024-09-28 (×4): qty 2

## 2024-09-28 MED ORDER — POLYSACCHARIDE IRON COMPLEX 150 MG PO CAPS
150.0000 mg | ORAL_CAPSULE | Freq: Every day | ORAL | Status: DC
  Administered 2024-09-28 – 2024-09-29 (×2): 150 mg via ORAL
  Filled 2024-09-28 (×3): qty 1

## 2024-09-28 NOTE — Progress Notes (Signed)
  Progress Note   Patient: Patricia Henson FMW:[id number] DOB: 01/05/1991 DOA: 09/26/2024     2 DOS: the patient was seen and examined on 09/28/2024   Brief hospital course: Patricia Henson is a 33 y.o. female with medical history significant of HTN, sickle cell trait, history of recurrent kidney stones, presented with new onset of flank pain dysuria fever and chills.  CT abdomen pelvis showed obstructing left distal ureteral stone, with left-sided hydronephrosis and left kidney stranding. Patient started on IV antibiotics, ureteral stent was placed 11/2.   Principal Problem:   Ureteral stone Active Problems:   Acute pyelonephritis   Ureteral stone with hydronephrosis   Pyelonephritis   Assessment and Plan: epsis secondary to pyelonephritis Acute pyelonephritis secondary to obstructing kidney stone. Left-sided obstructive uropathy Patient initially had high fever, tachycardia and erythrocytosis.  This is secondary to polynephritis, obstruction. Patient is status post ureteral stent.  Blood culture now growing E. coli, initial collection of urine was also positive for E. coli, repeat urine culture after antibiotics was negative.  Patient currently on Rocephin . I called micro lab, blood culture will be finalized tomorrow.  Potentially she can go home with Cipro .    Class I obesity with BMI 34.75. Diet and excise.   Essential hypertension. Blood pressure medicine on hold.   Hypokalemia. Improved  Iron deficient anemia. Start oral iron.        Subjective:  Patient still complaining of flank pain, otherwise doing well.  No fever or chills.  Physical Exam: Vitals:   09/27/24 1519 09/27/24 1949 09/28/24 0417 09/28/24 0759  BP: 124/66 111/73 107/67 (!) 108/58  Pulse: 74 75 78 85  Resp: 18 16 16 18   Temp: 98.2 F (36.8 C) 98.1 F (36.7 C) 98 F (36.7 C) 98.4 F (36.9 C)  TempSrc: Oral     SpO2: 100% 100% 99% 100%  Weight:      Height:       General exam: Appears  calm and comfortable  Respiratory system: Clear to auscultation. Respiratory effort normal. Cardiovascular system: S1 & S2 heard, RRR. No JVD, murmurs, rubs, gallops or clicks. No pedal edema. Gastrointestinal system: Abdomen is nondistended, soft and nontender. No organomegaly or masses felt. Normal bowel sounds heard. Central nervous system: Alert and oriented. No focal neurological deficits. Extremities: Symmetric 5 x 5 power. Skin: No rashes, lesions or ulcers Psychiatry: Judgement and insight appear normal. Mood & affect appropriate.    Data Reviewed:  Lab results reviewed.  Family Communication: None  Disposition: Status is: Inpatient Remains inpatient appropriate because: Severity of disease, IV treatment     Time spent: 35 minutes  Author: Murvin Mana, MD 09/28/2024 1:00 PM  For on call review [email or web address].

## 2024-09-28 NOTE — Plan of Care (Signed)
  Problem: Pain Managment: Goal: General experience of comfort will improve and/or be controlled Outcome: Progressing   Problem: Safety: Goal: Ability to remain free from injury will improve Outcome: Progressing

## 2024-09-28 NOTE — TOC Progression Note (Signed)
 Transition of Care [facility]) - Progression Note    Patient Details  Name: Patricia Henson MRN: [id number] Date of Birth: 08-Jan-1991  [facility] CM/SW Contact  Racheal LITTIE Schimke, RN Phone Number: 09/28/2024, 4:08 PM  Clinical Narrative: Spoke with patient about discharge plan, MD walked in and informed patient that she may discharge tomorrow, 09/29/24 pending on culture results. Patient says her boyfriend will transport home.                       Expected Discharge Plan and Services                                               Social Drivers of Health (SDOH) Interventions SDOH Screenings   Food Insecurity: No Food Insecurity (09/26/2024)  Housing: Unknown (09/26/2024)  Transportation Needs: No Transportation Needs (09/26/2024)  Utilities: Not At Risk (09/26/2024)  Financial Resource Strain: Low Risk  (11/09/2021)   Received from [facility]  Tobacco Use: High Risk (09/26/2024)    Readmission Risk Interventions     No data to display

## 2024-09-29 ENCOUNTER — Other Ambulatory Visit: Payer: Self-pay

## 2024-09-29 DIAGNOSIS — N201 Calculus of ureter: Secondary | ICD-10-CM | POA: Diagnosis not present

## 2024-09-29 LAB — CULTURE, BLOOD (ROUTINE X 2): Special Requests: ADEQUATE

## 2024-09-29 LAB — VITAMIN B12: Vitamin B-12: 607 pg/mL (ref 180–914)

## 2024-09-29 LAB — FOLATE: Folate: 9.4 ng/mL (ref >5.9)

## 2024-09-29 MED ORDER — OXYBUTYNIN CHLORIDE 5 MG PO TABS
5.0000 mg | ORAL_TABLET | Freq: Three times a day (TID) | ORAL | 1 refills | Status: AC | PRN
  Filled 2024-09-29: qty 30, 10d supply, fill #0

## 2024-09-29 MED ORDER — POLYSACCHARIDE IRON COMPLEX 150 MG PO CAPS
150.0000 mg | ORAL_CAPSULE | Freq: Every day | ORAL | 2 refills | Status: AC
  Filled 2024-09-29: qty 30, 30d supply, fill #0

## 2024-09-29 MED ORDER — CIPROFLOXACIN HCL 500 MG PO TABS
500.0000 mg | ORAL_TABLET | Freq: Two times a day (BID) | ORAL | 0 refills | Status: AC
  Filled 2024-09-29: qty 20, 10d supply, fill #0

## 2024-09-29 MED ORDER — OXYCODONE HCL 5 MG PO TABS
5.0000 mg | ORAL_TABLET | Freq: Four times a day (QID) | ORAL | 0 refills | Status: AC | PRN
  Filled 2024-09-29: qty 20, 3d supply, fill #0

## 2024-09-29 MED ORDER — ASCORBIC ACID 500 MG PO TABS
500.0000 mg | ORAL_TABLET | Freq: Every day | ORAL | 2 refills | Status: AC
  Filled 2024-09-29: qty 30, 30d supply, fill #0

## 2024-09-29 MED ORDER — ACETAMINOPHEN 325 MG PO TABS: 650.0000 mg | ORAL_TABLET | Freq: Four times a day (QID) | ORAL | Status: AC | PRN

## 2024-09-29 MED ORDER — TAMSULOSIN HCL 0.4 MG PO CAPS
0.4000 mg | ORAL_CAPSULE | Freq: Every day | ORAL | 0 refills | Status: DC
  Filled 2024-09-29: qty 30, 30d supply, fill #0

## 2024-09-29 MED ORDER — CIPROFLOXACIN HCL 500 MG PO TABS
500.0000 mg | ORAL_TABLET | Freq: Two times a day (BID) | ORAL | Status: DC
Start: 2024-09-29 — End: 2024-09-29
  Administered 2024-09-29: 500 mg via ORAL
  Filled 2024-09-29: qty 1

## 2024-09-29 NOTE — Plan of Care (Signed)
  Problem: Pain Managment: Goal: General experience of comfort will improve and/or be controlled Outcome: Progressing   Problem: Safety: Goal: Ability to remain free from injury will improve Outcome: Progressing

## 2024-09-29 NOTE — Plan of Care (Signed)

## 2024-09-29 NOTE — Discharge Summary (Signed)
 [facility] Discharge Summary   Patient: Patricia Henson FMW:[id number]  PCP: Pcp, No  Date of admission: 09/26/2024   Date of discharge: 09/29/2024 09/29/2024     Discharge Diagnoses:  Principal Problem:   Ureteral stone Active Problems:   Acute pyelonephritis   Ureteral stone with hydronephrosis   Pyelonephritis   Admitted From: Home Disposition:  Home   Recommendations for Outpatient Follow-up:  PCP: in  1wk Follow up LABS/TEST:  in 2 weeks    Follow-up Information     PCP Follow up in 1 week(s).          Patricia Penne SAUNDERS, MD Follow up in 2 week(s).   Specialty: Urology Contact information: [address] [place] [address] [phone]                Diet recommendation: Cardiac diet  Activity: The patient is advised to gradually reintroduce usual activities, as tolerated  Discharge Condition: stable  Code Status: Full code   History of present illness: As per the H and P dictated on admission.  Hospital Course:  Patricia Henson is a 33 y.o. female with medical history significant of HTN, sickle cell trait, history of recurrent kidney stones, presented with new onset of flank pain dysuria fever and chills.  CT abdomen pelvis showed obstructing left distal ureteral stone, with left-sided hydronephrosis and left kidney stranding. Patient started on IV antibiotics, ureteral stent was placed 11/2.   Assessment and Plan:  # Sepsis secondary to pyelonephritis # Acute pyelonephritis secondary to obstructing kidney stone. # Left-sided obstructive uropathy Patient initially had high fever, tachycardia and erythrocytosis.  This is secondary to polynephritis, obstruction. Patient is status post ureteral stent.   Blood culture and urine culture growing E. coli resistant to Bactrim, intermediate to Amp/sulbactam.  Sensitive to rest. Patient still has pain which is controlled with opiates.  Overall patient feels improvement and she would prefer to go  home today.  Clinically she is stable and medically optimized.  Patient was on ceftriaxone  during hospital stay, transition to ciprofloxacin  500 mg p.o. twice daily for 10 days.  Patient was cleared by urology to discharge and follow-up as an outpatient in 2 weeks. Continued Flomax  0.4 mg p.o. daily and oxybutynin as needed.  # Essential hypertension: BP remained stable.  Patient is not on any antihypertensive medication.  Patient was advised to monitor BP at home.  Follow-up with PCP.  # Hypokalemia: Resolved # Iron deficient anemia: Tsat 7%, continue oral iron for vit C.  Follow-up with PCP to repeat iron profile after 3 to 6 months.  # Obesity class I Body mass index is 34.75 kg/m.  Nutrition Interventions:Calorie restricted diet and daily exercise advised to lose body weight.  Lifestyle modification discussed.   Pain control  - [place]  Controlled Substance Reporting System database could not be reviewed, as website was not working.. - Oxycodone  5 -10 mg p.o. Q8 hourly prn prescribed.  - Patient was instructed, not to drive, operate heavy machinery, perform activities at heights, swimming or participation in water activities or provide baby sitting services while on Pain, Sleep and Anxiety Medications; until her outpatient Physician has advised to do so again.  - Also recommended to not to take more than prescribed Pain, Sleep and Anxiety Medications.  Patient was ambulatory without any assistance.  On the day of the discharge the patient's vitals were stable, and no other acute medical condition were reported by patient. the patient was felt safe to be  discharge at home.   Consultants: Urology Procedures: s/p Left ureteral stent insertion  Discharge Exam: General: Appear in no distress, no Rash; Oral Mucosa Clear, moist. Cardiovascular: S1 and S2 Present, no Murmur, Respiratory: normal respiratory effort, Bilateral Air entry present and no Crackles, no wheezes Abdomen: BS  present, Soft and mild left flank tenderness. Extremities: no Pedal edema, no calf tenderness Neurology: alert and oriented to time, place, and person affect appropriate.  Filed Weights   09/26/24 0710  Weight: 86.2 kg   Vitals:   09/29/24 0616 09/29/24 0801  BP: 120/79 119/85  Pulse: 81 79  Resp: 16 16  Temp: 98.5 F (36.9 C) 99.6 F (37.6 C)  SpO2: 100% 98%    DISCHARGE MEDICATION: Allergies as of 09/29/2024       Reactions   Penicillins Dermatitis, Hives, Rash   Unsure reaction as a child, pt states hives but        Medication List     STOP taking these medications    ibuprofen  800 MG tablet Commonly known as: ADVIL    ketorolac  10 MG tablet Commonly known as: TORADOL    ondansetron  4 MG disintegrating tablet Commonly known as: ZOFRAN -ODT   phenazopyridine  200 MG tablet Commonly known as: PYRIDIUM    sulfamethoxazole-trimethoprim 800-160 MG tablet Commonly known as: BACTRIM DS       TAKE these medications    acetaminophen  325 MG tablet Commonly known as: TYLENOL  Take 2 tablets (650 mg total) by mouth every 6 (six) hours as needed for mild pain (pain score 1-3), fever or headache (or Fever >/= 101).   ascorbic acid 500 MG tablet Commonly known as: VITAMIN C Take 1 tablet (500 mg total) by mouth daily.   ciprofloxacin  500 MG tablet Commonly known as: CIPRO  Take 1 tablet (500 mg total) by mouth 2 (two) times daily for 10 days.   Ferrex 150 150 MG capsule Generic drug: iron polysaccharides Take 1 capsule (150 mg total) by mouth daily. Start taking on: September 30, 2024   oxybutynin 5 MG tablet Commonly known as: DITROPAN Take 1 tablet (5 mg total) by mouth every 8 (eight) hours as needed for bladder spasms.   oxyCODONE  5 MG immediate release tablet Commonly known as: Roxicodone  Take 1-2 tablets (5-10 mg total) by mouth every 6 (six) hours as needed for severe pain (pain score 7-10) or moderate pain (pain score 4-6).   tamsulosin  0.4 MG Caps  capsule Commonly known as: FLOMAX  Take 1 capsule (0.4 mg total) by mouth daily. Start taking on: September 30, 2024       Allergies  Allergen Reactions   Penicillins Dermatitis, Hives and Rash    Unsure reaction as a child, pt states hives but   Discharge Instructions     Call MD for:  difficulty breathing, headache or visual disturbances   Complete by: As directed    Call MD for:  extreme fatigue   Complete by: As directed    Call MD for:  persistant dizziness or light-headedness   Complete by: As directed    Call MD for:  persistant nausea and vomiting   Complete by: As directed    Call MD for:  severe uncontrolled pain   Complete by: As directed    Call MD for:  temperature >100.4   Complete by: As directed    Discharge instructions   Complete by: As directed    PCP in 1 wk Urology in 2-3 wks   Increase activity slowly   Complete by: As  directed    No wound care   Complete by: As directed        The results of significant diagnostics from this hospitalization (including imaging, microbiology, ancillary and laboratory) are listed below for reference.    Significant Diagnostic Studies: DG OR UROLOGY CYSTO IMAGE ([facility] ONLY) Result Date: 09/26/2024 There is no interpretation for this exam.  This order is for images obtained during a surgical procedure.  Please See Surgeries Tab for more information regarding the procedure.   CT ABDOMEN PELVIS W CONTRAST Result Date: 09/26/2024 EXAM: CT ABDOMEN AND PELVIS WITH CONTRAST 09/26/2024 08:55:34 AM TECHNIQUE: CT of the abdomen and pelvis was performed with the administration of 100 mL of iohexol  (OMNIPAQUE ) 300 MG/ML solution. Multiplanar reformatted images are provided for review. Automated exposure control, iterative reconstruction, and/or weight-based adjustment of the mA/kV was utilized to reduce the radiation dose to as low as reasonably achievable. COMPARISON: CT of the abdomen and pelvis dated 08/24/2024. CLINICAL HISTORY:  Abdominal pain, acute, nonlocalized; lower abd pain, dysuria, h/o stones, prior chole. FINDINGS: LOWER CHEST: Appears minimal atelectasis present independently within the lower lobes bilaterally. LIVER: The liver is unremarkable. GALLBLADDER AND BILE DUCTS: The gallbladder is absent. No biliary ductal dilatation. SPLEEN: No acute abnormality. PANCREAS: No acute abnormality. ADRENAL GLANDS: No acute abnormality. KIDNEYS, URETERS AND BLADDER: There is an ovoid calculus obstructing the left distal ureter measuring approximately 10 x 5 x 8 mm. There is moderate left hydroureteronephrosis and perinephric stranding. There is also delayed left nephrogram. No stones in the right kidney or ureter. No right hydronephrosis or right perinephric/periureteral stranding. Urinary bladder is unremarkable. GI AND BOWEL: Stomach demonstrates no acute abnormality. There is no bowel obstruction. PERITONEUM AND RETROPERITONEUM: No ascites. No free air. VASCULATURE: Aorta is normal in caliber. LYMPH NODES: No lymphadenopathy. REPRODUCTIVE ORGANS: No acute abnormality. BONES AND SOFT TISSUES: No acute osseous abnormality. No focal soft tissue abnormality. IMPRESSION: 1. Ovoid calculus obstructing the left distal ureter measuring approximately 10 x 5 x 8 mm with moderate left hydroureteronephrosis, perinephric stranding, and delayed left nephrogram. Electronically signed by: Evalene Coho MD 09/26/2024 09:21 AM EST RP Workstation: [id number]    Microbiology: Recent Results (from the past 240 hours)  Urine Culture     Status: Abnormal   Collection Time: 09/26/24  7:19 AM   Specimen: Urine, Random  Result Value Ref Range Status   Specimen Description   Final    URINE, RANDOM Performed at [facility], [address], [place], [place] [address]    Special Requests   Final    NONE Reflexed from [id number] Performed at [facility], [address] [place], [place] [address]    Culture >=100,000 COLONIES/mL  ESCHERICHIA COLI (A)  Final   Report Status 09/28/2024 FINAL  Final   Organism ID, Bacteria ESCHERICHIA COLI (A)  Final      Susceptibility   Escherichia coli - MIC*    AMPICILLIN >=32 RESISTANT Resistant     CEFAZOLIN (URINE) Value in next row Sensitive      2 SENSITIVEThis is a modified FDA-approved test that has been validated and its performance characteristics determined by the reporting laboratory.  This laboratory is certified under the Clinical Laboratory Improvement Amendments CLIA as qualified to perform high complexity clinical laboratory testing.    CEFEPIME Value in next row Sensitive      2 SENSITIVEThis is a modified FDA-approved test that has been validated and its performance characteristics determined by the reporting laboratory.  This laboratory  is certified under the Clinical Laboratory Improvement Amendments CLIA as qualified to perform high complexity clinical laboratory testing.    ERTAPENEM Value in next row Sensitive      2 SENSITIVEThis is a modified FDA-approved test that has been validated and its performance characteristics determined by the reporting laboratory.  This laboratory is certified under the Clinical Laboratory Improvement Amendments CLIA as qualified to perform high complexity clinical laboratory testing.    CEFTRIAXONE  Value in next row Sensitive      2 SENSITIVEThis is a modified FDA-approved test that has been validated and its performance characteristics determined by the reporting laboratory.  This laboratory is certified under the Clinical Laboratory Improvement Amendments CLIA as qualified to perform high complexity clinical laboratory testing.    CIPROFLOXACIN  Value in next row Sensitive      2 SENSITIVEThis is a modified FDA-approved test that has been validated and its performance characteristics determined by the reporting laboratory.  This laboratory is certified under the Clinical Laboratory Improvement Amendments CLIA as qualified to perform high  complexity clinical laboratory testing.    GENTAMICIN Value in next row Sensitive      2 SENSITIVEThis is a modified FDA-approved test that has been validated and its performance characteristics determined by the reporting laboratory.  This laboratory is certified under the Clinical Laboratory Improvement Amendments CLIA as qualified to perform high complexity clinical laboratory testing.    NITROFURANTOIN Value in next row Sensitive      2 SENSITIVEThis is a modified FDA-approved test that has been validated and its performance characteristics determined by the reporting laboratory.  This laboratory is certified under the Clinical Laboratory Improvement Amendments CLIA as qualified to perform high complexity clinical laboratory testing.    TRIMETH/SULFA Value in next row Resistant      2 SENSITIVEThis is a modified FDA-approved test that has been validated and its performance characteristics determined by the reporting laboratory.  This laboratory is certified under the Clinical Laboratory Improvement Amendments CLIA as qualified to perform high complexity clinical laboratory testing.    AMPICILLIN/SULBACTAM Value in next row Intermediate      2 SENSITIVEThis is a modified FDA-approved test that has been validated and its performance characteristics determined by the reporting laboratory.  This laboratory is certified under the Clinical Laboratory Improvement Amendments CLIA as qualified to perform high complexity clinical laboratory testing.    PIP/TAZO Value in next row Sensitive      <=4 SENSITIVEThis is a modified FDA-approved test that has been validated and its performance characteristics determined by the reporting laboratory.  This laboratory is certified under the Clinical Laboratory Improvement Amendments CLIA as qualified to perform high complexity clinical laboratory testing.    MEROPENEM Value in next row Sensitive      <=4 SENSITIVEThis is a modified FDA-approved test that has been  validated and its performance characteristics determined by the reporting laboratory.  This laboratory is certified under the Clinical Laboratory Improvement Amendments CLIA as qualified to perform high complexity clinical laboratory testing.    * >=100,000 COLONIES/mL ESCHERICHIA COLI  Blood culture (routine x 2)     Status: Abnormal   Collection Time: 09/26/24 10:15 AM   Specimen: BLOOD  Result Value Ref Range Status   Specimen Description   Final    BLOOD BLOOD RIGHT ARM Performed at [facility], [address], [place], [place] [address]    Special Requests   Final    BOTTLES DRAWN AEROBIC AND ANAEROBIC Blood Culture adequate volume Performed at  [facility], [address], [place], [place] [address]    Culture  Setup Time   Final    GRAM NEGATIVE RODS IN BOTH AEROBIC AND ANAEROBIC BOTTLES CRITICAL RESULT CALLED TO, READ BACK BY AND VERIFIED WITH: TIFFANY GILCHRIST PHARM.D 09/26/24 2214 KG Performed at [facility], [address] [place], [place] [address]    Culture ESCHERICHIA COLI (A)  Final   Report Status 09/29/2024 FINAL  Final   Organism ID, Bacteria ESCHERICHIA COLI  Final      Susceptibility   Escherichia coli - MIC*    AMPICILLIN >=32 RESISTANT Resistant     CEFAZOLIN (NON-URINE) 4 INTERMEDIATE Intermediate     CEFEPIME <=0.12 SENSITIVE Sensitive     ERTAPENEM <=0.12 SENSITIVE Sensitive     CEFTRIAXONE  <=0.25 SENSITIVE Sensitive     CIPROFLOXACIN  <=0.06 SENSITIVE Sensitive     GENTAMICIN <=1 SENSITIVE Sensitive     MEROPENEM <=0.25 SENSITIVE Sensitive     TRIMETH/SULFA >=320 RESISTANT Resistant     AMPICILLIN/SULBACTAM >=32 RESISTANT Resistant     PIP/TAZO Value in next row Sensitive      <=4 SENSITIVEThis is a modified FDA-approved test that has been validated and its performance characteristics determined by the reporting laboratory.  This laboratory is certified under the Clinical Laboratory Improvement Amendments CLIA as qualified to  perform high complexity clinical laboratory testing.    * ESCHERICHIA COLI  Blood Culture ID Panel (Reflexed)     Status: Abnormal   Collection Time: 09/26/24 10:15 AM  Result Value Ref Range Status   Enterococcus faecalis NOT DETECTED NOT DETECTED Final   Enterococcus Faecium NOT DETECTED NOT DETECTED Final   Listeria monocytogenes NOT DETECTED NOT DETECTED Final   Staphylococcus species NOT DETECTED NOT DETECTED Final   Staphylococcus aureus (BCID) NOT DETECTED NOT DETECTED Final   Staphylococcus epidermidis NOT DETECTED NOT DETECTED Final   Staphylococcus lugdunensis NOT DETECTED NOT DETECTED Final   Streptococcus species NOT DETECTED NOT DETECTED Final   Streptococcus agalactiae NOT DETECTED NOT DETECTED Final   Streptococcus pneumoniae NOT DETECTED NOT DETECTED Final   Streptococcus pyogenes NOT DETECTED NOT DETECTED Final   A.calcoaceticus-baumannii NOT DETECTED NOT DETECTED Final   Bacteroides fragilis NOT DETECTED NOT DETECTED Final   Enterobacterales DETECTED (A) NOT DETECTED Final    Comment: Enterobacterales represent a large order of gram negative bacteria, not a single organism. CRITICAL RESULT CALLED TO, READ BACK BY AND VERIFIED WITH: TIFFANY GILCHRIST PHARM.D 09/26/24 2214 KG    Enterobacter cloacae complex NOT DETECTED NOT DETECTED Final   Escherichia coli DETECTED (A) NOT DETECTED Final    Comment: CRITICAL RESULT CALLED TO, READ BACK BY AND VERIFIED WITH: TIFFANY GILCHRIST PHARM.D 09/26/24 2214 KG    Klebsiella aerogenes NOT DETECTED NOT DETECTED Final   Klebsiella oxytoca NOT DETECTED NOT DETECTED Final   Klebsiella pneumoniae NOT DETECTED NOT DETECTED Final   Proteus species NOT DETECTED NOT DETECTED Final   Salmonella species NOT DETECTED NOT DETECTED Final   Serratia marcescens NOT DETECTED NOT DETECTED Final   Haemophilus influenzae NOT DETECTED NOT DETECTED Final   Neisseria meningitidis NOT DETECTED NOT DETECTED Final   Pseudomonas aeruginosa NOT DETECTED  NOT DETECTED Final   Stenotrophomonas maltophilia NOT DETECTED NOT DETECTED Final   Candida albicans NOT DETECTED NOT DETECTED Final   Candida auris NOT DETECTED NOT DETECTED Final   Candida glabrata NOT DETECTED NOT DETECTED Final   Candida krusei NOT DETECTED NOT DETECTED Final   Candida parapsilosis NOT DETECTED NOT  DETECTED Final   Candida tropicalis NOT DETECTED NOT DETECTED Final   Cryptococcus neoformans/gattii NOT DETECTED NOT DETECTED Final   CTX-M ESBL NOT DETECTED NOT DETECTED Final   Carbapenem resistance IMP NOT DETECTED NOT DETECTED Final   Carbapenem resistance KPC NOT DETECTED NOT DETECTED Final   Carbapenem resistance NDM NOT DETECTED NOT DETECTED Final   Carbapenem resist OXA 48 LIKE NOT DETECTED NOT DETECTED Final   Carbapenem resistance VIM NOT DETECTED NOT DETECTED Final    Comment: Performed at [facility], [address] [place], [place] [address]  Blood culture (routine x 2)     Status: None (Preliminary result)   Collection Time: 09/26/24 11:11 AM   Specimen: BLOOD  Result Value Ref Range Status   Specimen Description BLOOD BLOOD LEFT HAND  Final   Special Requests   Final    BOTTLES DRAWN AEROBIC AND ANAEROBIC Blood Culture adequate volume   Culture   Final    NO GROWTH 3 DAYS Performed at [facility], [address] [place], [place] [address]    Report Status PENDING  Incomplete  Urine Culture (for pregnant, neutropenic or urologic patients or patients with an indwelling urinary catheter)     Status: None   Collection Time: 09/26/24  1:49 PM   Specimen: Urine, Clean Catch  Result Value Ref Range Status   Specimen Description   Final    URINE, CLEAN CATCH Performed at [facility], [address], [place], [place] [address]    Special Requests   Final    NONE Performed at [facility], [address], [place], [place] [address]    Culture   Final    NO GROWTH Performed at [facility], [address], [place], [place] [address]    Report Status 09/27/2024 FINAL  Final     Labs: CBC: Recent Labs  Lab 09/26/24 0719 09/27/24 0522 09/28/24 0529  WBC 18.7* 16.0* 11.6*  HGB 11.3* 9.7* 8.8*  HCT 31.4* 27.3* 25.1*  MCV 76.2* 76.7* 77.7*  PLT 202 172 184   Basic Metabolic Panel: Recent Labs  Lab 09/26/24 0719 09/27/24 0522  NA 136 137  K 3.3* 4.2  CL 100 107  CO2 21* 24  GLUCOSE 172* 148*  BUN 19 14  CREATININE 1.14* 0.55  CALCIUM 9.6 9.2   Liver Function Tests: Recent Labs  Lab 09/26/24 0719  AST 27  ALT 19  ALKPHOS 76  BILITOT 1.6*  PROT 8.1  ALBUMIN 3.5   Recent Labs  Lab 09/26/24 0719  LIPASE 22   No results for input(s): AMMONIA  in the last 168 hours. Cardiac Enzymes: No results for input(s): CKTOTAL, CKMB, CKMBINDEX, TROPONINI in the last 168 hours. BNP (last 3 results) No results for input(s): BNP in the last 8760 hours. CBG: No results for input(s): GLUCAP in the last 168 hours.  Time spent: 35 minutes  Signed:  Elvan Sor  [facility] 09/29/2024  1:24 PM

## 2024-09-29 NOTE — Progress Notes (Signed)
 Discharge education completed. Currently getting dressed. Patient waiting on pain meds and ride.   Patricia Henson [name]

## 2024-09-30 ENCOUNTER — Telehealth: Payer: Self-pay

## 2024-09-30 ENCOUNTER — Other Ambulatory Visit: Payer: Self-pay

## 2024-09-30 DIAGNOSIS — N201 Calculus of ureter: Secondary | ICD-10-CM

## 2024-09-30 NOTE — Progress Notes (Signed)
 Surgical Physician Order Form [facility] [place]  Dr. Georganne * Scheduling expectation : 2-3 weeks  *Length of Case:   *Clearance needed: no  *Anticoagulation Instructions: N/A  *Aspirin Instructions: N/A  *Post-op visit Date/Instructions:  1 week cysto stent removal  *Diagnosis: Left Ureteral Stone  *Procedure: left  Ureteroscopy w/laser lithotripsy & stent exchange (47643)   Additional orders: N/A  -Admit type: OUTpatient  -Anesthesia: General  -VTE Prophylaxis Standing Order SCD's       Other:   -Standing Lab Orders Per Anesthesia    Lab other: None  -Standing Test orders EKG/Chest x-ray per Anesthesia       Test other:   - Medications:  TBD per inpatient cultures (PCN allergy)  -Other orders:  N/A

## 2024-09-30 NOTE — Telephone Encounter (Signed)
 Per Dr. Georganne, Patient is to be scheduled for  Left Ureteroscopy with Laser Lithotripsy and Stent Exchange   Ms. Gaspari was contacted and possible surgical dates were discussed, Tuesday November 18th, 2025 was agreed upon for surgery.   Patient was directed to call [phone] between 1-3pm the day before surgery to find out surgical arrival time.  Instructions were given not to eat or drink from midnight on the night before surgery and have a driver for the day of surgery. On the surgery day patient was instructed to enter through the [facility] entrance of [facility] report the Same Day Surgery desk.   Pre-Admit Testing will be in contact via phone to set up an interview with the anesthesia team to review your history and medications prior to surgery.   Reminder of this information was sent via MyChart to the patient.

## 2024-09-30 NOTE — Progress Notes (Signed)
   [facility] Surgical Posting Form  Surgery Date: Date: 09/30/24  Surgeon: Dr. Penne Skye, MD  Inpt ( No  )   Outpt (Yes)   Obs ( No  )   Diagnosis: N20.1 Left Ureteral Stone  -CPT: [phone]  Surgery: Left Ureteroscopy with Laser Lithotripsy and Stent Exchange  Stop Anticoagulations: No  Cardiac/Medical/Pulmonary Clearance needed: no  *Orders entered into EPIC  Date: 09/30/24   *Case booked in [place]  Date: 09/30/24  *Notified pt of Surgery: Date: 09/30/24  PRE-OP UA & CX: no  *Placed into Prior Authorization Work [place] Date: 09/30/24  Assistant/laser/rep:No

## 2024-10-01 ENCOUNTER — Encounter: Payer: Self-pay | Admitting: Family Medicine

## 2024-10-01 ENCOUNTER — Ambulatory Visit (INDEPENDENT_AMBULATORY_CARE_PROVIDER_SITE_OTHER): Admitting: Family Medicine

## 2024-10-01 VITALS — BP 131/89 | HR 83 | Resp 15 | Ht 62.0 in | Wt 193.0 lb

## 2024-10-01 DIAGNOSIS — I10 Essential (primary) hypertension: Secondary | ICD-10-CM

## 2024-10-01 DIAGNOSIS — Z7689 Persons encountering health services in other specified circumstances: Secondary | ICD-10-CM

## 2024-10-01 DIAGNOSIS — N12 Tubulo-interstitial nephritis, not specified as acute or chronic: Secondary | ICD-10-CM

## 2024-10-01 DIAGNOSIS — N132 Hydronephrosis with renal and ureteral calculous obstruction: Secondary | ICD-10-CM

## 2024-10-01 DIAGNOSIS — D649 Anemia, unspecified: Secondary | ICD-10-CM

## 2024-10-01 DIAGNOSIS — B379 Candidiasis, unspecified: Secondary | ICD-10-CM | POA: Diagnosis not present

## 2024-10-01 LAB — CULTURE, BLOOD (ROUTINE X 2)
Culture: NO GROWTH
Special Requests: ADEQUATE

## 2024-10-01 MED ORDER — FLUCONAZOLE 150 MG PO TABS: ORAL_TABLET | ORAL | 0 refills | Status: AC

## 2024-10-01 NOTE — Patient Instructions (Signed)

## 2024-10-01 NOTE — Progress Notes (Signed)
 New Patient Office Visit  Subjective   Patient ID: Patricia Henson, female    DOB: [birth year]  Age: 33 y.o. MRN: [id number]  CC:  Chief Complaint  Patient presents with   Establish Care   Discussed the use of AI scribe software for clinical note transcription with the patient, who gave verbal consent to proceed.  History of Present Illness   Patricia Henson is a 33 year old female who presents to establish with [facility] at [facility]. PMHx: anemia, gestational DM & HTN, kidney stone   She was recently hospitalized for a kidney infection caused by a kidney stone, from 11/2-11/03/2024. She continues to have ongoing symptoms post-hospitalization.The stone has not passed, and she is scheduled for a procedure to remove the stent and stone on November 18th. She experiences ongoing pain and nausea, with difficulty eating and feeling dehydrated. Her appetite is poor.  She is currently on antibiotics and reports symptoms suggestive of a yeast infection, which she associates with the antibiotic use. She has a history of anemia, gestational diabetes, and hypertension. She was given iron tablets for low blood count and started them yesterday. She also reports frequent urination and difficulty holding urine, which she attributes to the stent placement.  She reports that she had high blood pressure and fever during her infection. Her potassium levels were low but have since normalized. She is taking multiple medications, including oxycodone . She has a known allergy to penicillins.      Outpatient Encounter Medications as of 10/01/2024  Medication Sig   acetaminophen  (TYLENOL ) 325 MG tablet Take 2 tablets (650 mg total) by mouth every 6 (six) hours as needed for mild pain (pain score 1-3), fever or headache (or Fever >/= 101).   ascorbic acid (VITAMIN C) 500 MG tablet Take 1 tablet (500 mg total) by mouth daily.   ciprofloxacin  (CIPRO ) 500 MG tablet Take 1 tablet (500 mg total) by mouth  2 (two) times daily for 10 days.   fluconazole  (DIFLUCAN ) 150 MG tablet Take 1 tablet (150mg ) by mouth after completing antibiotics. If symptoms persist, take 1 tablet (150mg ) in 72 hours by mouth.   iron polysaccharides (NIFEREX) 150 MG capsule Take 1 capsule (150 mg total) by mouth daily.   oxybutynin (DITROPAN) 5 MG tablet Take 1 tablet (5 mg total) by mouth every 8 (eight) hours as needed for bladder spasms.   oxyCODONE  (ROXICODONE ) 5 MG immediate release tablet Take 1-2 tablets (5-10 mg total) by mouth every 6 (six) hours as needed for severe pain (pain score 7-10) or moderate pain (pain score 4-6).   tamsulosin  (FLOMAX ) 0.4 MG CAPS capsule Take 1 capsule (0.4 mg total) by mouth daily.   No facility-administered encounter medications on file as of 10/01/2024.    Patient Active Problem List   Diagnosis Date Noted   Ureteral stone 09/26/2024   Acute pyelonephritis 09/26/2024   Ureteral stone with hydronephrosis 09/26/2024   Pyelonephritis 09/26/2024   Labor and delivery indication for care or intervention 11/04/2021   Elevated blood pressure affecting pregnancy in third trimester, antepartum 06/27/2020   Hypertension affecting pregnancy 04/12/2018   Obesity in pregnancy 04/12/2018   Labor and delivery, indication for care 04/05/2018   Indication for care in labor or delivery 04/04/2018   Hypertension 03/20/2018   Other headache syndrome 01/13/2018   History of gestational diabetes 12/25/2017   History of marijuana use 12/25/2017   History of pre-eclampsia 12/25/2017   Maternal care for abnormalities of the fetal  heart rate or rhythm, second trimester, not applicable or unspecified 12/23/2017   Obesity due to excess calories 12/23/2017   Past Medical History:  Diagnosis Date   Anemia    Gestational diabetes    diet controlled   Headache    Kidney stone    Pregnancy induced hypertension    Past Surgical History:  Procedure Laterality Date   CHOLECYSTECTOMY     CYSTOSCOPY W/  URETERAL STENT PLACEMENT Left 09/26/2024   Procedure: CYSTOSCOPY, WITH RETROGRADE PYELOGRAM AND URETERAL STENT INSERTION;  Surgeon: Georganne Penne SAUNDERS, MD;  Location: [facility] ORS;  Service: Urology;  Laterality: Left;   WISDOM TOOTH EXTRACTION     Family History  Problem Relation Age of Onset   Diabetes Mother    Social History   Socioeconomic History   Marital status: Single    Spouse name: Not on file   Number of children: Not on file   Years of education: Not on file   Highest education level: Not on file  Occupational History   Not on file  Tobacco Use   Smoking status: Every Day    Types: Cigarettes   Smokeless tobacco: Never  Vaping Use   Vaping status: Never Used  Substance and Sexual Activity   Alcohol use: No   Drug use: Yes    Types: Marijuana    Comment: 2 weeks ago last use   Sexual activity: Yes    Birth control/protection: Surgical  Other Topics Concern   Not on file  Social History Narrative   Not on file   Social Drivers of Health   Financial Resource Strain: Low Risk  (11/09/2021)   Received from [facility]   Overall Financial Resource Strain (CARDIA)    Difficulty of Paying Living Expenses: Not very hard  Food Insecurity: No Food Insecurity (09/26/2024)   Hunger Vital Sign    Worried About Running Out of Food in the Last Year: Never true    Ran Out of Food in the Last Year: Never true  Transportation Needs: No Transportation Needs (09/26/2024)   PRAPARE - Administrator, Civil Service (Medical): No    Lack of Transportation (Non-Medical): No  Physical Activity: Not on file  Stress: Not on file  Social Connections: Not on file  Intimate Partner Violence: Not At Risk (09/26/2024)   Humiliation, Afraid, Rape, and Kick questionnaire    Fear of Current or Ex-Partner: No    Emotionally Abused: No    Physically Abused: No    Sexually Abused: No   Outpatient Medications Prior to Visit  Medication Sig Dispense Refill   acetaminophen   (TYLENOL ) 325 MG tablet Take 2 tablets (650 mg total) by mouth every 6 (six) hours as needed for mild pain (pain score 1-3), fever or headache (or Fever >/= 101).     ascorbic acid (VITAMIN C) 500 MG tablet Take 1 tablet (500 mg total) by mouth daily. 30 tablet 2   ciprofloxacin  (CIPRO ) 500 MG tablet Take 1 tablet (500 mg total) by mouth 2 (two) times daily for 10 days. 20 tablet 0   iron polysaccharides (NIFEREX) 150 MG capsule Take 1 capsule (150 mg total) by mouth daily. 30 capsule 2   oxybutynin (DITROPAN) 5 MG tablet Take 1 tablet (5 mg total) by mouth every 8 (eight) hours as needed for bladder spasms. 30 tablet 1   oxyCODONE  (ROXICODONE ) 5 MG immediate release tablet Take 1-2 tablets (5-10 mg total) by mouth every 6 (six) hours as needed  for severe pain (pain score 7-10) or moderate pain (pain score 4-6). 20 tablet 0   tamsulosin  (FLOMAX ) 0.4 MG CAPS capsule Take 1 capsule (0.4 mg total) by mouth daily. 30 capsule 0   No facility-administered medications prior to visit.   Allergies  Allergen Reactions   Penicillins Dermatitis, Hives and Rash    Unsure reaction as a child, pt states hives but   ROS: see HPI    Objective  Today's Vitals   10/01/24 1054  BP: 131/89  Pulse: 83  Resp: 15  SpO2: 98%  Weight: 193 lb (87.5 kg)  Height: 5' 2 (1.575 m)  PainSc: 0-No pain   GENERAL: Well-appearing, in NAD. Well nourished.  SKIN: Pink, warm and dry. No rash, lesion, ulceration, or ecchymoses.  Head: Normocephalic. NECK: Trachea midline. Full ROM w/o pain or tenderness. No lymphadenopathy.  EARS: Tympanic membranes are intact, translucent without bulging and without drainage. Appropriate landmarks visualized.  EYES: Conjunctiva clear without exudates. EOMI, PERRL, no drainage present.  NOSE: Septum midline w/o deformity. Nares patent, mucosa pink and non-inflamed w/o drainage. No sinus tenderness.  THROAT: Uvula midline. Oropharynx clear. Tonsils non-inflamed without exudate. Mucous  membranes pink and moist.  RESPIRATORY: Chest wall symmetrical. Respirations even and non-labored. Breath sounds clear to auscultation bilaterally.  CARDIAC: S1, S2 present, regular rate and rhythm without murmur or gallops. Peripheral pulses 2+ bilaterally.  MSK: Muscle tone and strength appropriate for age. Joints w/o tenderness, redness, or swelling.  EXTREMITIES: Without clubbing, cyanosis, or edema.  NEUROLOGIC: No motor or sensory deficits. Steady, even gait. C2-C12 intact.  PSYCH/MENTAL STATUS: Alert, oriented x 3. Cooperative, appropriate mood and affect.   Assessment & Plan:   1. Encounter to establish care (Primary) Patient is a 15- year-old female who presents today to establish care with primary care at [facility]. Reviewed the past medical history, family history, social history, surgical history, medications and allergies today- updates made as indicated. Patient presents today for a hospital follow-up.   2. Ureteral stone with hydronephrosis See #3  3. Pyelonephritis Kidney stone with ureteral stent complicated by urinary tract infection and urinary frequency. Persistent pain and nausea due to kidney stone with ureteral stent. Urinary frequency likely due to stent placement. Infection managed with antibiotics. Stent and stone removal scheduled for November 18th. Continue current antibiotics.   4. Yeast infection Yeast infection likely secondary to antibiotic use. Prescribed Diflucan  (fluconazole ) with instructions to take one tablet after completing antibiotics. If symptoms persist, take a second tablet in three days. - fluconazole  (DIFLUCAN ) 150 MG tablet; Take 1 tablet (150mg ) by mouth after completing antibiotics. If symptoms persist, take 1 tablet (150mg ) in 72 hours by mouth.  Dispense: 2 tablet; Refill: 0  5. Anemia, unspecified type Low blood count. Currently taking iron tablets. Potassium levels normalized. - Continue iron tablets. - Repeat blood count in three  months. - Monitor for symptoms of lightheadedness, dizziness, weakness, or feeling cold, and repeat blood count sooner if these occur.  6. Primary hypertension Elevated blood pressure during recent infection. Current blood pressure, heart rate, and oxygen levels are stable. Continue to monitor blood pressure.    Return in about 3 months (around 01/01/2025) for chronic conditions .   Evalene Arts, FNP

## 2024-10-06 ENCOUNTER — Other Ambulatory Visit: Payer: Self-pay

## 2024-10-06 ENCOUNTER — Encounter
Admission: RE | Admit: 2024-10-06 | Discharge: 2024-10-06 | Disposition: A | Discharge status: Home / Self Care | Source: Ambulatory Visit | Attending: Urology | Admitting: Urology

## 2024-10-06 VITALS — Ht 62.0 in | Wt 192.0 lb

## 2024-10-06 DIAGNOSIS — Z01818 Encounter for other preprocedural examination: Secondary | ICD-10-CM

## 2024-10-06 NOTE — Patient Instructions (Addendum)
 Your procedure is scheduled on: TUESDAY 10/12/24  Report to the Registration Desk on the 1st floor of the [facility]. To find out your arrival time, please call [phone] between 1PM - 3PM on: MONDAY 10/11/24  If your arrival time is 6:00 am, do not arrive before that time as the [facility] entrance doors do not open until 6:00 am.  REMEMBER: Instructions that are not followed completely may result in serious medical risk, up to and including death; or upon the discretion of your surgeon and anesthesiologist your surgery may need to be rescheduled.  Do not eat food after midnight the night before surgery.  No gum chewing or hard candies.  You may however, drink WATER liquids up to 2 hours before you are scheduled to arrive for your surgery. Do not drink anything within 2 hours of your scheduled arrival time.  One week prior to surgery: Stop Anti-inflammatories (NSAIDS) such as Advil , Aleve, Ibuprofen , Motrin , Naproxen, Naprosyn and Aspirin based products such as Excedrin, Goody's Powder, BC Powder.  Stop ANY OVER THE COUNTER supplements until after surgery.  You may however, continue to take Tylenol  if needed for pain up until the day of surgery.  Continue taking all of your other prescription medications up until the day of surgery.  ON THE DAY OF SURGERY ONLY TAKE THESE MEDICATIONS WITH SIPS OF WATER:  none  No Alcohol for 24 hours before or after surgery.  No Smoking including e-cigarettes for 24 hours before surgery.  No chewable tobacco products for at least 6 hours before surgery.  No nicotine patches on the day of surgery.  Do not use any recreational drugs for at least a week (preferably 2 weeks) before your surgery.  Please be advised that the combination of cocaine and anesthesia may have negative outcomes, up to and including death. If you test positive for cocaine, your surgery will be cancelled.  On the morning of surgery brush your teeth with toothpaste  and water, you may rinse your mouth with mouthwash if you wish. Do not swallow any toothpaste or mouthwash.  Do not wear jewelry, make-up, hairpins, clips or nail polish.  For welded (permanent) jewelry: bracelets, anklets, waist bands, etc.  Please have this removed prior to surgery.  If it is not removed, there is a chance that hospital personnel will need to cut it off on the day of surgery.  Do not wear lotions, powders, or perfumes.   Do not shave body hair from the neck down 48 hours before surgery.  Contact lenses, hearing aids and dentures may not be worn into surgery.  Do not bring valuables to the hospital. [facility] is not responsible for any missing/lost belongings or valuables.   Notify your doctor if there is any change in your medical condition (cold, fever, infection).  Wear comfortable clothing (specific to your surgery type) to the hospital.   After surgery, you can help prevent lung complications by doing breathing exercises.  Take deep breaths and cough every 1-2 hours. Your doctor may order a device called an Incentive Spirometer to help you take deep breaths. When coughing or sneezing, hold a pillow firmly against your incision with both hands. This is called "splinting." Doing this helps protect your incision. It also decreases belly discomfort.  If you are being discharged the day of surgery, you will not be allowed to drive home. You will need a responsible individual to drive you home and stay with you for 24 hours after surgery.  If you are taking public transportation, you will need to have a responsible individual with you.  Please call the Pre-admissions Testing Dept. at [phone] if you have any questions about these instructions.  Surgery Visitation Policy:  Patients having surgery or a procedure may have two visitors.  Children under the age of 43 must have an adult with them who is not the patient.  Merchandiser, Retail to address  health-related social needs:  [email or web address]

## 2024-10-07 NOTE — Progress Notes (Signed)
 Perioperative Services Pre-Admission/Anesthesia Testing   Date: 10/07/24 Name: Patricia Henson MRN:   [id number]  Re: Consideration of preoperative prophylactic antibiotic change   Request sent to: Georganne Penne SAUNDERS, MD (routed and/or faxed via [facility])  Planned Surgical Procedure(s):     Case: [id number] Date/Time: 10/12/24 1540   Procedure: CYSTOSCOPY/URETEROSCOPY/HOLMIUM LASER/STENT PLACEMENT (Left) - EXCHANGE   Anesthesia type: General   Diagnosis: Left ureteral stone [N20.1]   Pre-op diagnosis: Left Ureteral Stone   Location: [facility] OR ROOM 10 / [facility]   Surgeons: Georganne Penne SAUNDERS, MD        Clinical Notes:  Patient has a documented allergy/intolerance to PCN  The actual reaction to PCN I was during childhood. She believes that it has a urticarial rash,    EMR review indicated that patient received PCN and/or cephalosporin in the past as follows: CEPHALEXIN , CEFDINIR , and CEFTRIAXONE  have been tolerated with no documented ADRs. Allergy section in EMR updated to reflect tolerance.    Screened as appropriate for cephalosporin use during medication reconciliation No immediate angioedema, dysphagia, SOB, anaphylaxis symptoms. No severe rash involving mucous membranes or skin necrosis. No hospital admissions related to side effects of PCN/cephalosporin use.  No documented reaction to PCN or cephalosporin in the last 10 years.  Request:  As an evidence based approach to reducing the rate of incidence for post-operative SSI and the development of MDROs, could an agent that allows for narrower antimicrobial coverage for preoperative prophylaxis in this patient's upcoming surgical course be considered?   Currently ordered preoperative prophylactic ABX: LEVOFLOXACIN.   Specifically requesting change to cephalosporin (CEFAZOLIN).   Drug of choice for many procedures; it is the most widely studied antimicrobial agent with proven efficacy for antimicrobial  prophylaxis.   Desirable duration of action, spectrum of activity against organisms commonly encountered in surgery, and it has an excellent safety profile and low cost.   Active against streptococci, methicillin-susceptible staphylococci, and many gram-negative organisms.  Despite being a first-generation cephalosporin, cefazolin is structurally different than other cephalosporins. Cefazolin has two side chains (R1 and R2) that have structures that do not match those of any other FDA-approved ?-lactams or PCN.  True allergies to cefazolin are extremely rare (<1%) and are usually specific for its side chains, not the shared core ring.  Please communicate decision with me and I will change the orders in Epic as per your direction.    Things to consider: Many patients report that they were allergic to PCN earlier in life, however this does not translate into a true lifelong allergy. Patients can lose sensitivity to specific IgE antibodies over time if PCN is avoided (Kleris & Lugar, 2019).  Penicillin allergies are reported by 8% to 15% of the [place]  population, but up to 95% of these allergies do not correspond to a true allergy when tested Mathias et al., 2022).   Cross-sensitivity between PCN and cephalosporins has been documented as being as high as 10%, however this estimation included data believed to have been collected in a setting where there was contamination. Newer data suggests that the prevalence of cross-sensitivity between PCN and cephalosporins is actually estimated to be closer to 1% (Hermanides et al., 2018).   Patients labeled as PCN allergic, whether they are truly allergic or not, have been found to have inferior outcomes in terms of rates of serious infection, and these patients tend to have longer hospital stays [facility] & Lugar, 2019).  Treatment related secondary infections, such as Clostridioides difficile,  have been linked to the improper use of broad spectrum antibiotics in  patients improperly labeled as PCN allergic (Kleris & Lugar, 2019).  Anaphylaxis from cephalosporins is rare and the evidence suggests that there is no increased risk of an anaphylactic type reaction when cephalosporins are used in a PCN allergic patient (Pichichero, 2006).  Citations: Hermanides J, Lemkes BA, Prins ONA Dose MW, Terreehorst I. Presumed ?-Lactam Allergy and Cross-reactivity in the Operating Theater: A Practical Approach. Anesthesiology. 2018 Aug;129(2):335-342. doi: 10.1097/ALN.0000000000002252. PMID: 70237819.  Kleris, R. S., & Lugar, P. L. (2019). Things We Do For No Reason: Failing to Question a Penicillin Allergy History. Journal of hospital medicine, 14(10), 8025877161. Advance online publication. airportbarriers.com  Pichichero, M. E. (2006). Cephalosporins can be prescribed safely for penicillin-allergic patients. Journal of family medicine, 55(2), 106-112. Accessed: https://cdn.mdedge.com/files/s17fs-public/Document/September-2017/5502JFP_AppliedEvidence1.pdf  Mikki CANDIE Bethena KYM RONAL Mickey Beverley JUDITHANN DELENA., & Estelle, D. R. (2022). Understanding Penicillin Allergy, Cross-reactivity, and Antibiotic Selection in the Preoperative Setting. The Journal of the American Academy of Orthopaedic Surgeons, 30(1), e1-e5. Callrank.tn   Dorise Pereyra, MSN, APRN, FNP-C, CEN [facility]  Perioperative Services Nurse Practitioner Phone: [phone] Fax: [phone] 10/07/24 7:09 AM  NOTE: This note has been prepared using Dragon dictation software. Despite my best ability to proofread, there is always the potential that unintentional transcriptional errors may still occur from this process.

## 2024-10-11 MED ORDER — LACTATED RINGERS IV SOLN: INTRAVENOUS | Status: DC

## 2024-10-11 MED ORDER — CHLORHEXIDINE GLUCONATE 0.12 % MT SOLN
15.0000 mL | Freq: Once | OROMUCOSAL | Status: AC
  Administered 2024-10-12: 15 mL via OROMUCOSAL

## 2024-10-11 MED ORDER — ORAL CARE MOUTH RINSE: 15.0000 mL | Freq: Once | OROMUCOSAL | Status: AC

## 2024-10-11 MED ORDER — LEVOFLOXACIN IN D5W 500 MG/100ML IV SOLN
500.0000 mg | INTRAVENOUS | Status: AC
  Administered 2024-10-12: 500 mg via INTRAVENOUS

## 2024-10-12 ENCOUNTER — Other Ambulatory Visit: Payer: Self-pay

## 2024-10-12 ENCOUNTER — Ambulatory Visit

## 2024-10-12 ENCOUNTER — Encounter
Admission: RE | Disposition: A | Discharge status: Home / Self Care | Payer: Self-pay | Source: Home / Self Care | Attending: Urology

## 2024-10-12 ENCOUNTER — Ambulatory Visit
Admission: RE | Admit: 2024-10-12 | Discharge: 2024-10-12 | Disposition: A | Discharge status: Home / Self Care | Attending: Urology | Admitting: Urology

## 2024-10-12 ENCOUNTER — Ambulatory Visit: Payer: Self-pay | Admitting: Urgent Care

## 2024-10-12 ENCOUNTER — Encounter: Payer: Self-pay | Admitting: Urology

## 2024-10-12 DIAGNOSIS — D573 Sickle-cell trait: Secondary | ICD-10-CM | POA: Diagnosis not present

## 2024-10-12 DIAGNOSIS — Z01818 Encounter for other preprocedural examination: Secondary | ICD-10-CM

## 2024-10-12 DIAGNOSIS — N201 Calculus of ureter: Secondary | ICD-10-CM

## 2024-10-12 DIAGNOSIS — I10 Essential (primary) hypertension: Secondary | ICD-10-CM | POA: Insufficient documentation

## 2024-10-12 DIAGNOSIS — E119 Type 2 diabetes mellitus without complications: Secondary | ICD-10-CM | POA: Insufficient documentation

## 2024-10-12 DIAGNOSIS — F17200 Nicotine dependence, unspecified, uncomplicated: Secondary | ICD-10-CM | POA: Diagnosis not present

## 2024-10-12 DIAGNOSIS — D649 Anemia, unspecified: Secondary | ICD-10-CM | POA: Diagnosis not present

## 2024-10-12 DIAGNOSIS — R519 Headache, unspecified: Secondary | ICD-10-CM | POA: Diagnosis not present

## 2024-10-12 HISTORY — PX: CYSTOSCOPY/URETEROSCOPY/HOLMIUM LASER/STENT PLACEMENT: SHX6546

## 2024-10-12 LAB — POCT PREGNANCY, URINE: Preg Test, Ur: NEGATIVE

## 2024-10-12 SURGERY — CYSTOSCOPY/URETEROSCOPY/HOLMIUM LASER/STENT PLACEMENT
Anesthesia: General | Site: Ureter | Laterality: Left

## 2024-10-12 MED ORDER — TAMSULOSIN HCL 0.4 MG PO CAPS
0.4000 mg | ORAL_CAPSULE | Freq: Once | ORAL | Status: AC
  Administered 2024-10-12: 0.4 mg via ORAL

## 2024-10-12 MED ORDER — MIDAZOLAM HCL 2 MG/2ML IJ SOLN
INTRAMUSCULAR | Status: AC
  Filled 2024-10-12: qty 2

## 2024-10-12 MED ORDER — FENTANYL CITRATE (PF) 100 MCG/2ML IJ SOLN
INTRAMUSCULAR | Status: AC
  Filled 2024-10-12: qty 2

## 2024-10-12 MED ORDER — PROPOFOL 10 MG/ML IV BOLUS
INTRAVENOUS | Status: DC | PRN
  Administered 2024-10-12: 150 mg via INTRAVENOUS

## 2024-10-12 MED ORDER — PROPOFOL 10 MG/ML IV BOLUS
INTRAVENOUS | Status: AC
  Filled 2024-10-12: qty 20

## 2024-10-12 MED ORDER — ACETAMINOPHEN 500 MG PO TABS
1000.0000 mg | ORAL_TABLET | Freq: Once | ORAL | Status: AC
  Administered 2024-10-12: 1000 mg via ORAL

## 2024-10-12 MED ORDER — ACETAMINOPHEN 500 MG PO TABS
ORAL_TABLET | ORAL | Status: AC
  Filled 2024-10-12: qty 2

## 2024-10-12 MED ORDER — CHLORHEXIDINE GLUCONATE 0.12 % MT SOLN
OROMUCOSAL | Status: AC
  Filled 2024-10-12: qty 15

## 2024-10-12 MED ORDER — FENTANYL CITRATE (PF) 100 MCG/2ML IJ SOLN
INTRAMUSCULAR | Status: DC | PRN
  Administered 2024-10-12 (×2): 50 ug via INTRAVENOUS

## 2024-10-12 MED ORDER — LIDOCAINE HCL (CARDIAC) PF 100 MG/5ML IV SOSY
PREFILLED_SYRINGE | INTRAVENOUS | Status: DC | PRN
  Administered 2024-10-12: 80 mg via INTRAVENOUS

## 2024-10-12 MED ORDER — KETOROLAC TROMETHAMINE 30 MG/ML IJ SOLN
INTRAMUSCULAR | Status: AC
  Filled 2024-10-12: qty 1

## 2024-10-12 MED ORDER — KETOROLAC TROMETHAMINE 30 MG/ML IJ SOLN
INTRAMUSCULAR | Status: DC | PRN
  Administered 2024-10-12: 15 mg via INTRAVENOUS

## 2024-10-12 MED ORDER — OXYCODONE HCL 5 MG/5ML PO SOLN: 5.0000 mg | Freq: Once | ORAL | Status: AC | PRN

## 2024-10-12 MED ORDER — TAMSULOSIN HCL 0.4 MG PO CAPS: 0.4000 mg | ORAL_CAPSULE | Freq: Every day | ORAL | 0 refills | Status: AC

## 2024-10-12 MED ORDER — FENTANYL CITRATE (PF) 100 MCG/2ML IJ SOLN
25.0000 ug | INTRAMUSCULAR | Status: DC | PRN
  Administered 2024-10-12 (×3): 50 ug via INTRAVENOUS

## 2024-10-12 MED ORDER — LEVOFLOXACIN IN D5W 500 MG/100ML IV SOLN
INTRAVENOUS | Status: AC
  Filled 2024-10-12: qty 100

## 2024-10-12 MED ORDER — ONDANSETRON HCL 4 MG/2ML IJ SOLN
INTRAMUSCULAR | Status: DC | PRN
  Administered 2024-10-12: 4 mg via INTRAVENOUS

## 2024-10-12 MED ORDER — DEXAMETHASONE SOD PHOSPHATE PF 10 MG/ML IJ SOLN
INTRAMUSCULAR | Status: DC | PRN
  Administered 2024-10-12: 10 mg via INTRAVENOUS

## 2024-10-12 MED ORDER — ONDANSETRON HCL 4 MG/2ML IJ SOLN
INTRAMUSCULAR | Status: AC
  Filled 2024-10-12: qty 2

## 2024-10-12 MED ORDER — CIPROFLOXACIN HCL 500 MG PO TABS
ORAL_TABLET | ORAL | 0 refills | Status: AC
Start: 2024-10-12 — End: ?

## 2024-10-12 MED ORDER — LIDOCAINE HCL (PF) 2 % IJ SOLN
INTRAMUSCULAR | Status: AC
  Filled 2024-10-12: qty 5

## 2024-10-12 MED ORDER — MIDAZOLAM HCL (PF) 2 MG/2ML IJ SOLN
INTRAMUSCULAR | Status: DC | PRN
  Administered 2024-10-12: 2 mg via INTRAVENOUS

## 2024-10-12 MED ORDER — STERILE WATER FOR IRRIGATION IR SOLN
Status: DC | PRN
  Administered 2024-10-12: 500 mL

## 2024-10-12 MED ORDER — OXYCODONE HCL 5 MG PO TABS
ORAL_TABLET | ORAL | Status: AC
  Filled 2024-10-12: qty 1

## 2024-10-12 MED ORDER — PHENAZOPYRIDINE HCL 200 MG PO TABS: 200.0000 mg | ORAL_TABLET | Freq: Three times a day (TID) | ORAL | 0 refills | Status: AC | PRN

## 2024-10-12 MED ORDER — SODIUM CHLORIDE 0.9 % IR SOLN
Status: DC | PRN
  Administered 2024-10-12: 3000 mL

## 2024-10-12 MED ORDER — IOHEXOL 180 MG/ML  SOLN
INTRAMUSCULAR | Status: DC | PRN
  Administered 2024-10-12: 10 mL

## 2024-10-12 MED ORDER — OXYCODONE HCL 5 MG PO TABS
5.0000 mg | ORAL_TABLET | Freq: Once | ORAL | Status: AC | PRN
  Administered 2024-10-12: 5 mg via ORAL

## 2024-10-12 MED ORDER — TAMSULOSIN HCL 0.4 MG PO CAPS
ORAL_CAPSULE | ORAL | Status: AC
  Filled 2024-10-12: qty 1

## 2024-10-12 SURGICAL SUPPLY — 24 items
ADHESIVE MASTISOL STRL (MISCELLANEOUS) IMPLANT
BAG DRAIN SIEMENS DORNER NS (MISCELLANEOUS) ×1 IMPLANT
BAG PRESSURE INF REUSE 3000 (BAG) ×1 IMPLANT
BASKET ZERO TIP 1.9FR (BASKET) IMPLANT
BRUSH SCRUB EZ 4% CHG (MISCELLANEOUS) ×1 IMPLANT
CATH URET FLEX-TIP 2 LUMEN 10F (CATHETERS) IMPLANT
CATH URETL OPEN 5X70 (CATHETERS) IMPLANT
DRAPE UTILITY 15X26 TOWEL STRL (DRAPES) ×1 IMPLANT
DRSG TEGADERM 2-3/8X2-3/4 SM (GAUZE/BANDAGES/DRESSINGS) IMPLANT
FIBER LASER MOSES 200 DFL (Laser) IMPLANT
GLOVE BIOGEL PI IND STRL 7.0 (GLOVE) ×1 IMPLANT
GOWN STRL REUS W/ TWL LRG LVL3 (GOWN DISPOSABLE) ×2 IMPLANT
GUIDEWIRE STR DUAL SENSOR (WIRE) ×1 IMPLANT
KIT TURNOVER CYSTO (KITS) ×1 IMPLANT
PACK CYSTO AR (MISCELLANEOUS) ×1 IMPLANT
SET CYSTO IRRIGATION (SET/KITS/TRAYS/PACK) ×1 IMPLANT
SHEATH NAVIGATOR HD 12/14X36 (SHEATH) IMPLANT
SOL .9 NS 3000ML IRR UROMATIC (IV SOLUTION) ×1 IMPLANT
STENT URET 6FRX24 CONTOUR (STENTS) IMPLANT
STENT URET 6FRX26 CONTOUR (STENTS) IMPLANT
SURGILUBE 2OZ TUBE FLIPTOP (MISCELLANEOUS) ×1 IMPLANT
SYR 10ML LL (SYRINGE) ×1 IMPLANT
VALVE UROSEAL ADJ ENDO (VALVE) IMPLANT
WATER STERILE IRR 500ML POUR (IV SOLUTION) ×1 IMPLANT

## 2024-10-12 NOTE — Interval H&P Note (Signed)
 History and Physical Interval Note:  10/12/2024 2:41 PM  Patricia Henson  has presented today for surgery, with the diagnosis of Left Ureteral Stone.  The various methods of treatment have been discussed with the patient and family. After consideration of risks, benefits and other options for treatment, the patient has consented to  Procedure(s) with comments: CYSTOSCOPY/URETEROSCOPY/HOLMIUM LASER/STENT PLACEMENT (Left) - EXCHANGE as a surgical intervention.  The patient's history has been reviewed, patient examined, no change in status, stable for surgery.  I have reviewed the patient's chart and labs.  Questions were answered to the patient's satisfaction.    General: no acute distress, alert/oriented, conversational  HEENT: equal nondilated pupils CV: regular rate Lung: unlabored breathing, regular rate and rhythm  Abd: nondistended, nontender with palpation, no palpable masses  MSK: moving all extremities without issue, normal observed motor function  Laterality: Left Procedure: Left URS/LL, ureteral stent exchange  Informed consent obtained, we specifically discussed the risks of bleeding, infection, post-operative pain, need for additional procedures. Operative site appropriately marked where indicated  Penne JONELLE Skye, MD 10/12/2024

## 2024-10-12 NOTE — Transfer of Care (Signed)
 Immediate Anesthesia Transfer of Care Note  Patient: Patricia Henson  Procedure(s) Performed: CYSTOSCOPY/URETEROSCOPY/HOLMIUM LASER/STENT PLACEMENT (Left: Ureter)  Patient Location: PACU  Anesthesia Type:General  Level of Consciousness: drowsy and patient cooperative  Airway & Oxygen Therapy: Patient Spontanous Breathing and Patient connected to face mask oxygen  Post-op Assessment: Report given to RN and Post -op Vital signs reviewed and stable  Post vital signs: Reviewed and stable  Last Vitals:  Vitals Value Taken Time  BP 109/76 10/12/24 15:32  Temp    Pulse 59 10/12/24 15:33  Resp 24 10/12/24 15:33  SpO2 100 % 10/12/24 15:33  Vitals shown include unfiled device data.  Last Pain:  Vitals:   10/12/24 1418  TempSrc: Temporal  PainSc: 7          Complications: No notable events documented.

## 2024-10-12 NOTE — Anesthesia Procedure Notes (Signed)
 Procedure Name: LMA Insertion Date/Time: 10/12/2024 2:53 PM  Performed by: Lacretia Camelia NOVAK, CRNAPre-anesthesia Checklist: Patient identified, Emergency Drugs available, Suction available and Patient being monitored Patient Re-evaluated:Patient Re-evaluated prior to induction Oxygen Delivery Method: Circle system utilized Preoxygenation: Pre-oxygenation with 100% oxygen Induction Type: IV induction LMA: LMA inserted LMA Size: 4.0 Number of attempts: 1 Placement Confirmation: positive ETCO2 Tube secured with: Tape Dental Injury: Teeth and Oropharynx as per pre-operative assessment

## 2024-10-12 NOTE — Discharge Instructions (Addendum)
 DISCHARGE INSTRUCTIONS FOR KIDNEY STONE/URETERAL STENT   MEDICATIONS:  1.  Resume all your other meds from home - except do not take any extra narcotic pain meds that you may have at home.  2. Pyridium  is to help with the burning/stinging when you urinate. 3. Take Cipro  one hour prior to removal of your stent.   ACTIVITY:  1. No strenuous activity x 1week  2. No driving while on narcotic pain medications  3. Drink plenty of water  4. Continue to walk at home - you can still get blood clots when you are at home, so keep active, but don't over do it.  5. May return to work/school tomorrow or when you feel ready   BATHING:  1. You can shower and we recommend daily showers  2. You have a string coming from your urethra: The stent string is attached to your ureteral stent. Do not pull on this.   SIGNS/SYMPTOMS TO CALL:  Please call us  if you have a fever greater than 101.5, uncontrolled nausea/vomiting, uncontrolled pain, dizziness, unable to urinate, bloody urine, chest pain, shortness of breath, leg swelling, leg pain, redness around wound, drainage from wound, or any other concerns or questions.   You can reach us  at [phone].   FOLLOW-UP:  1. You will have an appointment in 6 weeks with a ultrasound of your kidneys prior.   2. You have a string attached to your stent, you may remove it on 10/15/24. To do this, pull the strings until the stents are completely removed. You may feel an odd sensation in your back.

## 2024-10-12 NOTE — Op Note (Signed)
 Preoperative diagnosis:  Left ureteral stone   Postoperative diagnosis:  Left ureteral stone   Procedure:  Cystoscopy Left ureteroscopy with laser lithotripsy Left retrograde pyelography with interpretation  Left ureteral stent exchange  Surgeon: Penne Skye, MD  Anesthesia: General  Complications: None  Intraoperative findings: Impacted left distal ureteral stone. Fully treated with laser lithotripsy, all fragments basket extracted  EBL: Minimal  Specimens:  Left ureteral stone  Indication: Patricia Henson is a 33 y.o. 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 We have discussed the potential benefits and risks of the procedure, side effects of the proposed treatment, the likelihood of the patient achieving the goals of the procedure, and any potential problems that might occur during the procedure or recuperation. Informed consent has been obtained.  Description of procedure:  The patient was taken to the operating room and general anesthesia was induced.  The patient was placed in the dorsal lithotomy position, prepped and draped in the usual sterile fashion, and preoperative antibiotics were administered. A preoperative time-out was performed.   Cystourethroscopy was performed.  The patient's urethra was examined and was normal. The bladder was then systematically examined in its entirety. There was no evidence for any bladder tumors, stones, or other mucosal pathology.  Bilateral ureteral orifices noted in orthotopic location.  Attention then turned to the Left ureteral orifice where the prexisting stent was visualized emanating from the UO. The stent was controlled with flexible graspers and externalized. Next, we introduced a sensor guidewire through the stent  into the left renal pelvis under fluroscopic guidance. The stent was then removed fully intact.   We transitioned to a semi-rigid ureteroscope. Stone burden was encountered at the distal ureter consistent with preoperative imaging. There was moderate ureteral inflammation and edema from the chronically impacted stone. A 200 m holmium laser was used to fully treat the stone with laser lithotripsy.   Next, a zero-tip wire basket was introduced and all sizeable stone fragments were extracted. We performed complete ureteroscopy up to the UPJ with the semi rigid scope- no additional fragments were noted.   We elected to place a temporary indwelling stent with a string. Our sensor guidewire was replaced and a 54F x 24 cm JJ stent , which was placed via fluoroscopic guidance. A spot KUB confirmed appropriate proximal and distal curl locations.   The bladder was then emptied and the procedure ended.  The patient appeared to tolerate the procedure well and without complications.  The patient was able to be awakened and transferred to the recovery unit in satisfactory condition.   Plan:  Remove the stent at home in 3 days - follow up in clinic in 4-6 weeks with RBUS prior  Penne Skye, M.D.

## 2024-10-12 NOTE — Anesthesia Preprocedure Evaluation (Signed)
 Anesthesia Evaluation  Patient identified by MRN, date of birth, ID band Patient awake    Reviewed: Allergy & Precautions, H&P , NPO status , Patient's Chart, lab work & pertinent test results, reviewed documented beta blocker date and time   Airway Mallampati: II  TM Distance: >3 FB Neck ROM: full    Dental  (+) Teeth Intact   Pulmonary Current Smoker and Patient abstained from smoking.   Pulmonary exam normal        Cardiovascular Exercise Tolerance: Good hypertension, On Medications Normal cardiovascular exam Rate:Normal     Neuro/Psych  Headaches negative neurological ROS  negative psych ROS   GI/Hepatic negative GI ROS, Neg liver ROS,,,  Endo/Other  diabetes, Well Controlled    Renal/GU      Musculoskeletal   Abdominal   Peds  Hematology  (+) Blood dyscrasia, anemia   Anesthesia Other Findings Past Medical History: No date: Anemia No date: Gestational diabetes     Comment:  diet controlled No date: Headache No date: Kidney stone No date: Pregnancy induced hypertension  Past Surgical History: No date: CHOLECYSTECTOMY 09/26/2024: CYSTOSCOPY W/ URETERAL STENT PLACEMENT; Left     Comment:  Procedure: CYSTOSCOPY, WITH RETROGRADE PYELOGRAM AND               URETERAL STENT INSERTION;  Surgeon: Georganne Penne SAUNDERS,               MD;  Location: [facility] ORS;  Service: Urology;  Laterality:               Left; No date: WISDOM TOOTH EXTRACTION  BMI    Body Mass Index: 34.75 kg/m      Reproductive/Obstetrics negative OB ROS                              Anesthesia Physical Anesthesia Plan  ASA: 2  Anesthesia Plan: General   Post-op Pain Management:    Induction: Intravenous  PONV Risk Score and Plan: 3 and Ondansetron , Dexamethasone and Midazolam  Airway Management Planned: Oral ETT  Additional Equipment:   Intra-op Plan:   Post-operative Plan: Extubation in OR  Informed  Consent: I have reviewed the patients History and Physical, chart, labs and discussed the procedure including the risks, benefits and alternatives for the proposed anesthesia with the patient or authorized representative who has indicated his/her understanding and acceptance.     Dental Advisory Given  Plan Discussed with: Anesthesiologist, CRNA and Surgeon  Anesthesia Plan Comments: (Patient consented for risks of anesthesia including but not limited to:  - adverse reactions to medications - damage to eyes, teeth, lips or other oral mucosa - nerve damage due to positioning  - sore throat or hoarseness - Damage to heart, brain, nerves, lungs, other parts of body or loss of life  Patient voiced understanding and assent.)        Anesthesia Quick Evaluation

## 2024-10-13 ENCOUNTER — Encounter: Payer: Self-pay | Admitting: Urology

## 2024-10-13 ENCOUNTER — Other Ambulatory Visit: Payer: Self-pay

## 2024-10-13 DIAGNOSIS — N200 Calculus of kidney: Secondary | ICD-10-CM

## 2024-10-13 DIAGNOSIS — N201 Calculus of ureter: Secondary | ICD-10-CM

## 2024-10-15 ENCOUNTER — Telehealth: Payer: Self-pay

## 2024-10-15 NOTE — Telephone Encounter (Signed)
 See separate encounter

## 2024-10-23 LAB — STONE ANALYSIS
Calcium Oxalate Dihydrate: 30 %
Calcium Oxalate Monohydrate: 60 %
Calcium Phosphate (Hydroxyl): 10 %
Weight Calculi: 7 mg

## 2024-10-26 NOTE — Anesthesia Postprocedure Evaluation (Signed)
 Anesthesia Post Note  Patient: Patricia Henson  Procedure(s) Performed: CYSTOSCOPY/URETEROSCOPY/HOLMIUM LASER/STENT PLACEMENT (Left: Ureter)  Patient location during evaluation: PACU Anesthesia Type: General Level of consciousness: awake and alert Pain management: pain level controlled Vital Signs Assessment: post-procedure vital signs reviewed and stable Respiratory status: spontaneous breathing, nonlabored ventilation, respiratory function stable and patient connected to nasal cannula oxygen Cardiovascular status: blood pressure returned to baseline and stable Postop Assessment: no apparent nausea or vomiting Anesthetic complications: no   No notable events documented.   Last Vitals:  Vitals:   10/12/24 1645 10/12/24 1702  BP: 123/81 126/86  Pulse: 76 80  Resp: 15 15  Temp:  36.7 C  SpO2: 100% 100%    Last Pain:  Vitals:   10/12/24 1715  TempSrc:   PainSc: 8                  Lynwood KANDICE Clause

## 2024-11-01 NOTE — Telephone Encounter (Signed)
 Letter printed and handed to pt on 10/15/2024

## 2024-11-03 ENCOUNTER — Ambulatory Visit: Payer: Self-pay

## 2024-11-08 DIAGNOSIS — N200 Calculus of kidney: Secondary | ICD-10-CM | POA: Insufficient documentation

## 2024-11-08 NOTE — Progress Notes (Deleted)
° °  11/10/2024 7:17 AM   Patricia Henson 1991-09-10 [id number]  Reason for visit: Follow up nephrolithiasis   HPI: 33 y.o. female, follow up with me today    - s/p Left URS/LL + 3d stent on 10/12/24  - chronic distal ureteral stone with impaction   Scheduled 4 week post op RBUS - not yet completed   Prior HPI: Hx of nephrolithiasis  - chronic 10 cm Left distal ureteral stone Saw Dr. Francisca in July 2025 - for the same distal left ureteral stone. Scheduled for surgery although lost to follow up.    - patient's mother was in stroke ICU in [place]   - s/p Left ureteral stent placement 09/26/24  - s/p Left URS/LL + 3d stent on 10/12/24   Physical Exam: There were no vitals taken for this visit.   Constitutional:  Alert and oriented, No acute distress. N/A  Laboratory Data: Calcium Oxalate Monohydrate 60 VC  Calcium Oxalate Dihydrate 30 VC  Calcium Phosphate (Hydroxyl) 10 VC    Pertinent Imaging: Scheduled 4 week post op RBUS - not yet completed     Assessment & Plan:    Nephrolithiasis Assessment & Plan: s/p Left URS/LL on 10/12/24  - chronic 10cm distal ureteral stone with impaction   - CaOx mono 60, di 30        Patricia JONELLE Skye, MD  [facility] [address] [place], [place] [address] [phone]

## 2024-11-08 NOTE — Assessment & Plan Note (Deleted)
 s/p Left URS/LL on 10/12/24  - chronic 10cm distal ureteral stone with impaction   - CaOx mono 60, di 30

## 2024-11-10 ENCOUNTER — Ambulatory Visit: Admitting: Urology

## 2024-11-10 NOTE — Progress Notes (Signed)
 See separate encounter

## 2024-11-11 ENCOUNTER — Encounter: Payer: Self-pay | Admitting: Urology

## 2024-12-31 ENCOUNTER — Ambulatory Visit: Admitting: Family Medicine
